# Patient Record
Sex: Female | Born: 1950 | Race: White | Hispanic: No | Marital: Married | Smoking: Former smoker
Health system: Southern US, Community
[De-identification: ages and names within clinical notes are randomized; demographics above are authoritative.]

## PROBLEM LIST (undated history)

## (undated) DIAGNOSIS — E119 Type 2 diabetes mellitus without complications: Secondary | ICD-10-CM

## (undated) DIAGNOSIS — C3490 Malignant neoplasm of unspecified part of unspecified bronchus or lung: Secondary | ICD-10-CM

## (undated) HISTORY — PX: ABDOMINAL HYSTERECTOMY: SHX81

## (undated) HISTORY — DX: Malignant neoplasm of unspecified part of unspecified bronchus or lung: C34.90

---

## 2004-11-17 ENCOUNTER — Emergency Department: Payer: Self-pay | Admitting: Emergency Medicine

## 2005-08-05 ENCOUNTER — Emergency Department: Payer: Self-pay | Admitting: Unknown Physician Specialty

## 2009-01-20 ENCOUNTER — Ambulatory Visit: Payer: Self-pay | Admitting: Internal Medicine

## 2009-08-02 ENCOUNTER — Emergency Department: Payer: Self-pay | Admitting: Emergency Medicine

## 2009-11-02 ENCOUNTER — Ambulatory Visit: Payer: Self-pay | Admitting: Internal Medicine

## 2011-05-02 ENCOUNTER — Other Ambulatory Visit: Payer: Self-pay | Admitting: Physical Medicine and Rehabilitation

## 2011-05-02 DIAGNOSIS — R911 Solitary pulmonary nodule: Secondary | ICD-10-CM

## 2011-05-09 ENCOUNTER — Other Ambulatory Visit: Payer: Self-pay

## 2011-07-31 ENCOUNTER — Other Ambulatory Visit: Payer: Self-pay

## 2011-08-10 ENCOUNTER — Other Ambulatory Visit: Payer: Self-pay

## 2011-12-18 ENCOUNTER — Emergency Department: Payer: Self-pay | Admitting: *Deleted

## 2014-09-13 ENCOUNTER — Ambulatory Visit: Payer: Self-pay | Admitting: Internal Medicine

## 2015-11-23 ENCOUNTER — Emergency Department
Admission: EM | Admit: 2015-11-23 | Discharge: 2015-11-23 | Disposition: A | Discharge status: Other Acute Inpatient Hospital | Payer: BLUE CROSS/BLUE SHIELD | Attending: Emergency Medicine | Admitting: Emergency Medicine

## 2015-11-23 ENCOUNTER — Encounter (HOSPITAL_COMMUNITY): Payer: Self-pay | Admitting: Emergency Medicine

## 2015-11-23 ENCOUNTER — Inpatient Hospital Stay (HOSPITAL_COMMUNITY): Payer: BLUE CROSS/BLUE SHIELD

## 2015-11-23 ENCOUNTER — Emergency Department: Payer: BLUE CROSS/BLUE SHIELD

## 2015-11-23 ENCOUNTER — Inpatient Hospital Stay (HOSPITAL_COMMUNITY)
Admission: EM | Admit: 2015-11-23 | Discharge: 2015-11-26 | DRG: 041 | Disposition: A | Discharge status: Home / Self Care | Payer: BLUE CROSS/BLUE SHIELD | Attending: Internal Medicine | Admitting: Internal Medicine

## 2015-11-23 ENCOUNTER — Encounter: Payer: Self-pay | Admitting: Emergency Medicine

## 2015-11-23 DIAGNOSIS — Z881 Allergy status to other antibiotic agents status: Secondary | ICD-10-CM

## 2015-11-23 DIAGNOSIS — C3490 Malignant neoplasm of unspecified part of unspecified bronchus or lung: Secondary | ICD-10-CM | POA: Diagnosis present

## 2015-11-23 DIAGNOSIS — R471 Dysarthria and anarthria: Secondary | ICD-10-CM | POA: Diagnosis present

## 2015-11-23 DIAGNOSIS — C801 Malignant (primary) neoplasm, unspecified: Secondary | ICD-10-CM | POA: Diagnosis not present

## 2015-11-23 DIAGNOSIS — Z87891 Personal history of nicotine dependence: Secondary | ICD-10-CM

## 2015-11-23 DIAGNOSIS — R4701 Aphasia: Secondary | ICD-10-CM | POA: Diagnosis present

## 2015-11-23 DIAGNOSIS — C649 Malignant neoplasm of unspecified kidney, except renal pelvis: Secondary | ICD-10-CM | POA: Diagnosis present

## 2015-11-23 DIAGNOSIS — C799 Secondary malignant neoplasm of unspecified site: Secondary | ICD-10-CM

## 2015-11-23 DIAGNOSIS — R278 Other lack of coordination: Secondary | ICD-10-CM | POA: Diagnosis present

## 2015-11-23 DIAGNOSIS — R599 Enlarged lymph nodes, unspecified: Secondary | ICD-10-CM | POA: Diagnosis not present

## 2015-11-23 DIAGNOSIS — Z7984 Long term (current) use of oral hypoglycemic drugs: Secondary | ICD-10-CM | POA: Diagnosis not present

## 2015-11-23 DIAGNOSIS — J440 Chronic obstructive pulmonary disease with acute lower respiratory infection: Secondary | ICD-10-CM | POA: Diagnosis present

## 2015-11-23 DIAGNOSIS — E876 Hypokalemia: Secondary | ICD-10-CM | POA: Diagnosis present

## 2015-11-23 DIAGNOSIS — Z90710 Acquired absence of both cervix and uterus: Secondary | ICD-10-CM | POA: Diagnosis not present

## 2015-11-23 DIAGNOSIS — Z880 Allergy status to penicillin: Secondary | ICD-10-CM | POA: Insufficient documentation

## 2015-11-23 DIAGNOSIS — C7931 Secondary malignant neoplasm of brain: Principal | ICD-10-CM | POA: Diagnosis present

## 2015-11-23 DIAGNOSIS — J449 Chronic obstructive pulmonary disease, unspecified: Secondary | ICD-10-CM

## 2015-11-23 DIAGNOSIS — J438 Other emphysema: Secondary | ICD-10-CM

## 2015-11-23 DIAGNOSIS — J189 Pneumonia, unspecified organism: Secondary | ICD-10-CM

## 2015-11-23 DIAGNOSIS — R1310 Dysphagia, unspecified: Secondary | ICD-10-CM | POA: Diagnosis present

## 2015-11-23 DIAGNOSIS — Z79899 Other long term (current) drug therapy: Secondary | ICD-10-CM | POA: Diagnosis not present

## 2015-11-23 DIAGNOSIS — G939 Disorder of brain, unspecified: Secondary | ICD-10-CM

## 2015-11-23 DIAGNOSIS — R4781 Slurred speech: Secondary | ICD-10-CM | POA: Insufficient documentation

## 2015-11-23 DIAGNOSIS — F419 Anxiety disorder, unspecified: Secondary | ICD-10-CM | POA: Diagnosis present

## 2015-11-23 DIAGNOSIS — E119 Type 2 diabetes mellitus without complications: Secondary | ICD-10-CM | POA: Diagnosis present

## 2015-11-23 DIAGNOSIS — J159 Unspecified bacterial pneumonia: Secondary | ICD-10-CM | POA: Insufficient documentation

## 2015-11-23 DIAGNOSIS — R590 Localized enlarged lymph nodes: Secondary | ICD-10-CM | POA: Diagnosis present

## 2015-11-23 DIAGNOSIS — E785 Hyperlipidemia, unspecified: Secondary | ICD-10-CM | POA: Diagnosis present

## 2015-11-23 HISTORY — DX: Type 2 diabetes mellitus without complications: E11.9

## 2015-11-23 LAB — COMPREHENSIVE METABOLIC PANEL
ALK PHOS: 66 U/L (ref 38–126)
ALT: 13 U/L — AB (ref 14–54)
ALT: 14 U/L (ref 14–54)
AST: 13 U/L — AB (ref 15–41)
AST: 15 U/L (ref 15–41)
Albumin: 3.7 g/dL (ref 3.5–5.0)
Albumin: 3 g/dL — ABNORMAL LOW (ref 3.5–5.0)
Alkaline Phosphatase: 69 U/L (ref 38–126)
Anion gap: 9 (ref 5–15)
Anion gap: 11 (ref 5–15)
BILIRUBIN TOTAL: 0.7 mg/dL (ref 0.3–1.2)
BILIRUBIN TOTAL: 0.7 mg/dL (ref 0.3–1.2)
BUN: 10 mg/dL (ref 6–20)
BUN: 6 mg/dL (ref 6–20)
CHLORIDE: 104 mmol/L (ref 101–111)
CHLORIDE: 103 mmol/L (ref 101–111)
CO2: 27 mmol/L (ref 22–32)
CO2: 26 mmol/L (ref 22–32)
CREATININE: 0.74 mg/dL (ref 0.44–1.00)
CREATININE: 0.65 mg/dL (ref 0.44–1.00)
Calcium: 9.7 mg/dL (ref 8.9–10.3)
Calcium: 9.9 mg/dL (ref 8.9–10.3)
GFR calc Af Amer: >60 mL/min (ref >60)
GFR calc Af Amer: >60 mL/min (ref >60)
GLUCOSE: 257 mg/dL — AB (ref 65–99)
Glucose, Bld: 242 mg/dL — ABNORMAL HIGH (ref 65–99)
Potassium: 3.1 mmol/L — ABNORMAL LOW (ref 3.5–5.1)
Potassium: 3.5 mmol/L (ref 3.5–5.1)
Sodium: 140 mmol/L (ref 135–145)
Sodium: 140 mmol/L (ref 135–145)
TOTAL PROTEIN: 7.7 g/dL (ref 6.5–8.1)
Total Protein: 7.8 g/dL (ref 6.5–8.1)

## 2015-11-23 LAB — CBC
HEMATOCRIT: 36.5 % (ref 35.0–47.0)
HEMOGLOBIN: 12.2 g/dL (ref 12.0–16.0)
MCH: 29.9 pg (ref 26.0–34.0)
MCHC: 33.5 g/dL (ref 32.0–36.0)
MCV: 89.3 fL (ref 80.0–100.0)
Platelets: 360 10*3/uL (ref 150–440)
RBC: 4.09 MIL/uL (ref 3.80–5.20)
RDW: 13.5 % (ref 11.5–14.5)
WBC: 8 10*3/uL (ref 3.6–11.0)

## 2015-11-23 LAB — CBC WITH DIFFERENTIAL/PLATELET
BASOS ABS: 0 10*3/uL (ref 0.0–0.1)
Basophils Relative: 0 %
Eosinophils Absolute: 0.1 10*3/uL (ref 0.0–0.7)
Eosinophils Relative: 1 %
HEMATOCRIT: 36.1 % (ref 36.0–46.0)
Hemoglobin: 12 g/dL (ref 12.0–15.0)
LYMPHS PCT: 25 %
Lymphs Abs: 2.1 10*3/uL (ref 0.7–4.0)
MCH: 29.9 pg (ref 26.0–34.0)
MCHC: 33.2 g/dL (ref 30.0–36.0)
MCV: 89.8 fL (ref 78.0–100.0)
Monocytes Absolute: 0.6 10*3/uL (ref 0.1–1.0)
Monocytes Relative: 8 %
Neutro Abs: 5.4 10*3/uL (ref 1.7–7.7)
Neutrophils Relative %: 66 %
Platelets: 369 10*3/uL (ref 150–400)
RBC: 4.02 MIL/uL (ref 3.87–5.11)
RDW: 13.6 % (ref 11.5–15.5)
WBC: 8.2 10*3/uL (ref 4.0–10.5)

## 2015-11-23 LAB — APTT
APTT: 35 s (ref 24–36)
aPTT: 35 seconds (ref 24–37)

## 2015-11-23 LAB — DIFFERENTIAL
BASOS ABS: 0.1 10*3/uL (ref 0–0.1)
Basophils Relative: 1 %
Eosinophils Absolute: 0.1 10*3/uL (ref 0–0.7)
Eosinophils Relative: 2 %
LYMPHS ABS: 1.7 10*3/uL (ref 1.0–3.6)
Lymphocytes Relative: 22 %
MONOS PCT: 8 %
Monocytes Absolute: 0.6 10*3/uL (ref 0.2–0.9)
NEUTROS ABS: 5.4 10*3/uL (ref 1.4–6.5)
Neutrophils Relative %: 67 %

## 2015-11-23 LAB — GLUCOSE, CAPILLARY: Glucose-Capillary: 347 mg/dL — ABNORMAL HIGH (ref 65–99)

## 2015-11-23 LAB — MAGNESIUM: MAGNESIUM: 1.7 mg/dL (ref 1.7–2.4)

## 2015-11-23 LAB — PROTIME-INR
INR: 1.08
INR: 1.14 (ref 0.00–1.49)
Prothrombin Time: 14.2 seconds (ref 11.4–15.0)
Prothrombin Time: 14.8 seconds (ref 11.6–15.2)

## 2015-11-23 MED ORDER — OMEGA-3-ACID ETHYL ESTERS 1 G PO CAPS
2.0000 g | ORAL_CAPSULE | Freq: Two times a day (BID) | ORAL | Status: DC
  Administered 2015-11-23 – 2015-11-26 (×6): 2 g via ORAL
  Filled 2015-11-23 (×7): qty 2

## 2015-11-23 MED ORDER — HYDROCODONE-ACETAMINOPHEN 5-325 MG PO TABS: 1.0000 | ORAL_TABLET | ORAL | Status: DC | PRN

## 2015-11-23 MED ORDER — PANTOPRAZOLE SODIUM 40 MG PO TBEC
40.0000 mg | DELAYED_RELEASE_TABLET | Freq: Every day | ORAL | Status: DC
  Administered 2015-11-24 – 2015-11-26 (×3): 40 mg via ORAL
  Filled 2015-11-23 (×3): qty 1

## 2015-11-23 MED ORDER — AZITHROMYCIN 250 MG PO TABS
500.0000 mg | ORAL_TABLET | Freq: Once | ORAL | Status: AC
  Administered 2015-11-23: 500 mg via ORAL
  Filled 2015-11-23: qty 2

## 2015-11-23 MED ORDER — GADOBENATE DIMEGLUMINE 529 MG/ML IV SOLN
15.0000 mL | Freq: Once | INTRAVENOUS | Status: AC | PRN
  Administered 2015-11-23: 15 mL via INTRAVENOUS

## 2015-11-23 MED ORDER — SODIUM CHLORIDE 0.9 % IV SOLN
INTRAVENOUS | Status: DC
  Administered 2015-11-23: 23:00:00 via INTRAVENOUS
  Filled 2015-11-23: qty 1000

## 2015-11-23 MED ORDER — DEXTROSE 5 % IV SOLN
1.0000 g | Freq: Once | INTRAVENOUS | Status: AC
  Administered 2015-11-23: 1 g via INTRAVENOUS
  Filled 2015-11-23: qty 10

## 2015-11-23 MED ORDER — ALPRAZOLAM 0.5 MG PO TABS
0.5000 mg | ORAL_TABLET | Freq: Three times a day (TID) | ORAL | Status: DC | PRN
  Administered 2015-11-23 – 2015-11-26 (×5): 0.5 mg via ORAL
  Filled 2015-11-23 (×6): qty 1

## 2015-11-23 MED ORDER — DEXAMETHASONE 4 MG PO TABS
4.0000 mg | ORAL_TABLET | Freq: Four times a day (QID) | ORAL | Status: DC
  Administered 2015-11-23 – 2015-11-26 (×11): 4 mg via ORAL
  Filled 2015-11-23 (×14): qty 1

## 2015-11-23 MED ORDER — ACETAMINOPHEN 325 MG PO TABS
650.0000 mg | ORAL_TABLET | Freq: Four times a day (QID) | ORAL | Status: DC | PRN
  Administered 2015-11-25: 650 mg via ORAL
  Filled 2015-11-23: qty 2

## 2015-11-23 MED ORDER — IOHEXOL 300 MG/ML  SOLN
25.0000 mL | INTRAMUSCULAR | Status: AC
  Administered 2015-11-23 – 2015-11-24 (×2): 25 mL via ORAL

## 2015-11-23 MED ORDER — ONDANSETRON HCL 4 MG PO TABS: 4.0000 mg | ORAL_TABLET | Freq: Four times a day (QID) | ORAL | Status: DC | PRN

## 2015-11-23 MED ORDER — ONDANSETRON HCL 4 MG/2ML IJ SOLN: 4.0000 mg | Freq: Four times a day (QID) | INTRAMUSCULAR | Status: DC | PRN

## 2015-11-23 MED ORDER — LEVETIRACETAM 500 MG PO TABS
500.0000 mg | ORAL_TABLET | Freq: Two times a day (BID) | ORAL | Status: DC
  Administered 2015-11-23 – 2015-11-26 (×6): 500 mg via ORAL
  Filled 2015-11-23 (×6): qty 1

## 2015-11-23 MED ORDER — POTASSIUM CHLORIDE CRYS ER 20 MEQ PO TBCR
40.0000 meq | EXTENDED_RELEASE_TABLET | Freq: Once | ORAL | Status: AC
  Administered 2015-11-23: 40 meq via ORAL
  Filled 2015-11-23: qty 2

## 2015-11-23 MED ORDER — INSULIN ASPART 100 UNIT/ML ~~LOC~~ SOLN
0.0000 [IU] | Freq: Three times a day (TID) | SUBCUTANEOUS | Status: DC
  Administered 2015-11-24: 7 [IU] via SUBCUTANEOUS
  Administered 2015-11-24 (×2): 5 [IU] via SUBCUTANEOUS
  Administered 2015-11-25: 2 [IU] via SUBCUTANEOUS
  Administered 2015-11-25 – 2015-11-26 (×4): 5 [IU] via SUBCUTANEOUS

## 2015-11-23 MED ORDER — INSULIN ASPART 100 UNIT/ML ~~LOC~~ SOLN
6.0000 [IU] | Freq: Once | SUBCUTANEOUS | Status: AC
  Administered 2015-11-23: 6 [IU] via SUBCUTANEOUS

## 2015-11-23 MED ORDER — LATANOPROST 0.005 % OP SOLN
1.0000 [drp] | Freq: Every day | OPHTHALMIC | Status: DC
  Administered 2015-11-23 – 2015-11-25 (×3): 1 [drp] via OPHTHALMIC
  Filled 2015-11-23: qty 2.5

## 2015-11-23 NOTE — ED Notes (Signed)
Pt has been being tx for pneumonia the past several days with Amoxicillin. Pt sts she noticed slurred speech that started Friday or Saturday that she remembers- but sts she slept a lot the couple days before that d./t illness. Pt denies headaches, weakness/numbness. Only complaint is slurred speech.

## 2015-11-23 NOTE — ED Notes (Signed)
MD at bedside to discuss treatment with pt and care in the future. Pt and family verbalized understanding of care from this point forward.

## 2015-11-23 NOTE — ED Notes (Signed)
Patient states she has had slurred speech since the 27th of December.  Patient has obvious difficulty forming some words.  Has no other neurological deficits noted during triage, equal grip strengths, no droop, and no drift.  Patient states that others have noticed it since that time, and that it did not start today.  She is alert and oriented, and anxious.  She attributes this to starting amoxicillin on the 27th for an URI because she read that it was a possible side effect.  She is hypertensive in triage at 182/102.

## 2015-11-23 NOTE — Consult Note (Signed)
Reason for Consult:multiple hemorrhagic cranial lesions Referring Physician: Tat  Cynthia Carson is an 65 y.o. female.  HPI: whom presented to her family physician approximately a week ago with a cough. She was given a a course of antibiotics without improvement. Her husband stated he thought her speech has been off for the same week. Family physician noted the slurred speech and stopped the amoxicillin for fear it was causing the dysarthria and arranged for a head CT scan. That scan revealed multiple abnormalities in the cranium and she was transferred to [facility] hospital for further evaluation. MRI scan revealed multiple hemorrhagic lesions located in the right cerebellar hemisphere with mild mass effect, and an occipital lesion and a parietal lesion also on the right side. She was admitted by the medical service for investigation of a primary lesion as the cranial imaging strongly suggests multiple metastatic lesions.  Past Medical History  Diagnosis Date  . Diabetes mellitus without complication [facility])     Past Surgical History  Procedure Laterality Date  . Abdominal hysterectomy      No family history on file.  Social History:  reports that she quit smoking 4 days ago. She does not have any smokeless tobacco history on file. She reports that she does not drink alcohol or use illicit drugs.  Allergies:  Allergies  Allergen Reactions  . Amoxicillin Other (See Comments)    Reaction:  Unknown   . Levofloxacin Other (See Comments)    Reaction:  Joint and muscle pain  Pt states that this medication made her unable to walk.     Medications: I have reviewed the patient's current medications.  Results for orders placed or performed during the hospital encounter of 11/23/15 (from the past 48 hour(s))  CBC with Differential/Platelet     Status: None   Collection Time: 11/23/15  4:45 PM  Result Value Ref Range   WBC 8.2 4.0 - 10.5 K/uL   RBC 4.02 3.87 - 5.11 MIL/uL   Hemoglobin 12.0 12.0 -  15.0 g/dL   HCT 36.1 36.0 - 46.0 %   MCV 89.8 78.0 - 100.0 fL   MCH 29.9 26.0 - 34.0 pg   MCHC 33.2 30.0 - 36.0 g/dL   RDW 13.6 11.5 - 15.5 %   Platelets 369 150 - 400 K/uL   Neutrophils Relative % 66 %   Neutro Abs 5.4 1.7 - 7.7 K/uL   Lymphocytes Relative 25 %   Lymphs Abs 2.1 0.7 - 4.0 K/uL   Monocytes Relative 8 %   Monocytes Absolute 0.6 0.1 - 1.0 K/uL   Eosinophils Relative 1 %   Eosinophils Absolute 0.1 0.0 - 0.7 K/uL   Basophils Relative 0 %   Basophils Absolute 0.0 0.0 - 0.1 K/uL  Comprehensive metabolic panel     Status: Abnormal   Collection Time: 11/23/15  4:45 PM  Result Value Ref Range   Sodium 140 135 - 145 mmol/L   Potassium 3.5 3.5 - 5.1 mmol/L   Chloride 103 101 - 111 mmol/L   CO2 26 22 - 32 mmol/L   Glucose, Bld 242 (H) 65 - 99 mg/dL   BUN 6 6 - 20 mg/dL   Creatinine, Ser 0.65 0.44 - 1.00 mg/dL   Calcium 9.9 8.9 - 10.3 mg/dL   Total Protein 7.7 6.5 - 8.1 g/dL   Albumin 3.0 (L) 3.5 - 5.0 g/dL   AST 15 15 - 41 U/L   ALT 14 14 - 54 U/L   Alkaline Phosphatase 66 38 -  126 U/L   Total Bilirubin 0.7 0.3 - 1.2 mg/dL   GFR calc non Af Amer >60 >60 mL/min   GFR calc Af Amer >60 >60 mL/min    Comment: (NOTE) The eGFR has been calculated using the CKD EPI equation. This calculation has not been validated in all clinical situations. eGFR's persistently <60 mL/min signify possible Chronic Kidney Disease.    Anion gap 11 5 - 15    Dg Chest 2 View  11/23/2015  CLINICAL DATA:  Shortness of Breath EXAM: CHEST  2 VIEW COMPARISON:  August 02, 2009 FINDINGS: There is airspace consolidation in the medial segment of the left lower lobe with volume loss. The lungs elsewhere clear. Heart size and pulmonary vascularity are normal. There is slight prominence in the azygos region compared to prior study. No other evidence suggesting potential adenopathy. No bone lesions. IMPRESSION: Airspace consolidation with volume loss medial segment left lower lobe. Suspect pneumonia,  although an endobronchial lesion causing obstruction of the medial segment left lower lobe bronchus is a possibility. Lungs elsewhere clear. There is prominence in the azygos region compared to the prior study. The possibility of localized lymph node enlargement must be of concern given this appearance unchanged from prior study. Given the findings above, contrast enhanced chest CT would be advisable to further assess. Electronically Signed   By: Lowella Grip III M.D.   On: 11/23/2015 13:51   Ct Head Wo Contrast  11/23/2015  CLINICAL DATA:  Slurred speech since 11/15/2015. EXAM: CT HEAD WITHOUT CONTRAST TECHNIQUE: Contiguous axial images were obtained from the base of the skull through the vertex without intravenous contrast. COMPARISON:  None. FINDINGS: The patient has multiple hemorrhagic mass lesions in the brain including a 3.5 cm hemorrhagic mass in the right cerebellar hemisphere which has a slight mass effect upon the fourth ventricle. There is a 16 mm hemorrhagic metastasis in the posterior aspect of the right occipital lobe and there is a 15 mm hemorrhagic metastasis high in the right parietal lobe. There are several small focal areas of lucency in the periventricular white matter of frontal lobes. There is no midline shift. No acute osseous abnormality. IMPRESSION: Multiple hemorrhagic brain lesions. This most likely represents metastatic disease. Melanoma, renal cell carcinoma, choriocarcinoma, thyroid carcinoma and lung cancer, breast cancer, and hepatocellular carcinoma are the most common hemorrhagic metastases to the brain. Electronically Signed   By: Lorriane Shire M.D.   On: 11/23/2015 12:53    Review of Systems  Constitutional: Positive for malaise/fatigue.  Eyes: Negative.   Respiratory: Positive for cough.   Cardiovascular: Negative.   Gastrointestinal: Negative.   Genitourinary: Negative.   Musculoskeletal: Negative.   Skin: Negative.   Neurological: Positive for headaches.  Negative for seizures and loss of consciousness.       Dysarthria  Endo/Heme/Allergies: Negative.   Psychiatric/Behavioral: Negative.    Blood pressure 156/88, pulse 91, temperature 98.1 F (36.7 C), temperature source Oral, resp. rate 20, height 5' 5"  (1.651 m), weight 74.8 kg (164 lb 14.5 oz), SpO2 99 %. Physical Exam  Constitutional: She is oriented to person, place, and time. She appears well-developed and well-nourished.  HENT:  Head: Normocephalic and atraumatic.  Eyes: Conjunctivae and EOM are normal. Pupils are equal, round, and reactive to light.  Neck: Normal range of motion. Neck supple.  Cardiovascular: Normal rate, regular rhythm and normal heart sounds.   Respiratory: Effort normal and breath sounds normal.  GI: Soft. Bowel sounds are normal.  Neurological: She is alert and  oriented to person, place, and time. She has normal strength and normal reflexes. She displays normal reflexes. No cranial nerve deficit. She exhibits normal muscle tone. Coordination abnormal. GCS eye subscore is 4. GCS verbal subscore is 5. GCS motor subscore is 6.  Dysdiadochokinesia left hand, past pointing left hand with finger nose finger testing.  No nystagmus Perrl, full eom No drift on exam Normal muscle tone and bulk Normal heel shin testing bilaterally Gait not assesed  Skin: Skin is warm, dry and intact.  Psychiatric: She has a normal mood and affect. Her behavior is normal. Judgment and thought content normal. Her speech is slurred. Cognition and memory are normal.    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  Will need decadron, and an anticonvulsant. Will follow.  Tonique Mendonca L 11/23/2015, 7:36 PM

## 2015-11-23 NOTE — ED Provider Notes (Signed)
CSN: [id number]     Arrival date & time 11/23/15  1526 History   First MD Initiated Contact with Patient 11/23/15 1531     Chief Complaint  Patient presents with  . Aphasia     (Consider location/radiation/quality/duration/timing/severity/associated sxs/prior Treatment) HPI   Patient is a 65 year old female presenting as a transfer today. Patient's been having slurred speech for the last 3 days. She reported to the emergency department today at [facility]. She was found to have multiple hemorrhagic brain  tumors. She's also been struggling with a pneumonia which has been treated twice as an outpatient. X-ray from [place] that shows continued left lower lobe consolidation.   Patient smoked for 40 years, quit 4 years ago.  Past Medical History  Diagnosis Date  . Diabetes mellitus without complication [facility])    Past Surgical History  Procedure Laterality Date  . Abdominal hysterectomy     No family history on file. Social History  Substance Use Topics  . Smoking status: Former Smoker    Quit date: 11/19/2015  . Smokeless tobacco: None  . Alcohol Use: No   OB History    No data available     Review of Systems  Constitutional: Negative for fever, activity change and fatigue.  Respiratory: Positive for cough and shortness of breath.   Cardiovascular: Negative for chest pain.  Gastrointestinal: Negative for abdominal pain.  Genitourinary: Negative for difficulty urinating.  Musculoskeletal: Negative for arthralgias.  Neurological: Positive for speech difficulty. Negative for dizziness, seizures, weakness, numbness and headaches.  Psychiatric/Behavioral: Positive for confusion. Negative for agitation.      Allergies  Amoxicillin and Levofloxacin  Home Medications   Prior to Admission medications   Medication Sig Start Date End Date Taking? Authorizing Provider  acetaminophen (TYLENOL) 325 MG tablet Take 650 mg by mouth every 6 (six) hours as needed for mild pain.    Yes Historical Provider, MD  ALPRAZolam Duanne Moron) 0.5 MG tablet Take 0.5 mg by mouth 3 (three) times daily as needed for anxiety.    Yes Historical Provider, MD  latanoprost (XALATAN) 0.005 % ophthalmic solution Place 1 drop into both eyes at bedtime.   Yes Historical Provider, MD  Omega-3 Fatty Acids (FISH OIL) 1000 MG CAPS Take 2,000 mg by mouth 2 (two) times daily.    Yes Historical Provider, MD  omeprazole (PRILOSEC) 20 MG capsule Take 20 mg by mouth 2 (two) times daily.    Yes Historical Provider, MD  sitaGLIPtin (JANUVIA) 100 MG tablet Take 100 mg by mouth daily.   Yes Historical Provider, MD   BP 130/60 mmHg  Pulse 87  Temp(Src) 98.2 F (36.8 C) (Oral)  Resp 22  SpO2 95% Physical Exam  Constitutional: She is oriented to person, place, and time. She appears well-developed and well-nourished.  HENT:  Head: Normocephalic and atraumatic.  Eyes: Conjunctivae are normal. Right eye exhibits no discharge.  Neck: Neck supple.  Cardiovascular: Normal rate, regular rhythm and normal heart sounds.   No murmur heard. Pulmonary/Chest: Effort normal. She has wheezes.  Abdominal: Soft. She exhibits no distension. There is no tenderness.  Musculoskeletal: Normal range of motion. She exhibits no edema.  Neurological: She is oriented to person, place, and time. No cranial nerve deficit.  Slurred speech. Mild dysmetria on the left. Difficulty in repeating sentences.  Skin: Skin is warm and dry. No rash noted. She is not diaphoretic.  Psychiatric: She has a normal mood and affect. Her behavior is normal.  Nursing note and vitals reviewed.  ED Course  Procedures (including critical care time) Labs Review Labs Reviewed  CBC WITH DIFFERENTIAL/PLATELET  COMPREHENSIVE METABOLIC PANEL    Imaging Review Dg Chest 2 View  11/23/2015  CLINICAL DATA:  Shortness of Breath EXAM: CHEST  2 VIEW COMPARISON:  August 02, 2009 FINDINGS: There is airspace consolidation in the medial segment of the left  lower lobe with volume loss. The lungs elsewhere clear. Heart size and pulmonary vascularity are normal. There is slight prominence in the azygos region compared to prior study. No other evidence suggesting potential adenopathy. No bone lesions. IMPRESSION: Airspace consolidation with volume loss medial segment left lower lobe. Suspect pneumonia, although an endobronchial lesion causing obstruction of the medial segment left lower lobe bronchus is a possibility. Lungs elsewhere clear. There is prominence in the azygos region compared to the prior study. The possibility of localized lymph node enlargement must be of concern given this appearance unchanged from prior study. Given the findings above, contrast enhanced chest CT would be advisable to further assess. Electronically Signed   By: Lowella Grip III M.D.   On: 11/23/2015 13:51   Ct Head Wo Contrast  11/23/2015  CLINICAL DATA:  Slurred speech since 11/15/2015. EXAM: CT HEAD WITHOUT CONTRAST TECHNIQUE: Contiguous axial images were obtained from the base of the skull through the vertex without intravenous contrast. COMPARISON:  None. FINDINGS: The patient has multiple hemorrhagic mass lesions in the brain including a 3.5 cm hemorrhagic mass in the right cerebellar hemisphere which has a slight mass effect upon the fourth ventricle. There is a 16 mm hemorrhagic metastasis in the posterior aspect of the right occipital lobe and there is a 15 mm hemorrhagic metastasis high in the right parietal lobe. There are several small focal areas of lucency in the periventricular white matter of frontal lobes. There is no midline shift. No acute osseous abnormality. IMPRESSION: Multiple hemorrhagic brain lesions. This most likely represents metastatic disease. Melanoma, renal cell carcinoma, choriocarcinoma, thyroid carcinoma and lung cancer, breast cancer, and hepatocellular carcinoma are the most common hemorrhagic metastases to the brain. Electronically Signed   By:  Lorriane Shire M.D.   On: 11/23/2015 12:53   I have personally reviewed and evaluated these images and lab results as part of my medical decision-making.   EKG Interpretation None      MDM   Final diagnoses:  None    Patient is a 65 year old female transferred here from [place] with new hemorrhagic  metastatic tumors to the brain. Patient also has left lower lung consolidation on x-ray. I'm concerned that the metastatic tumors could be from her lungs especially given history of 40 years pack history. We will touch base with neurosurgery.  Xray and CBC and Chem 7 already in computer.  4:48 PM Discussed with neurosurgery, they may start dex, they will consult, but recommend admission to find origin of metastasis.     Devron Cohick Julio Alm, MD 11/23/15 1649

## 2015-11-23 NOTE — ED Notes (Addendum)
Per ems-- pt from [facility] with c.o slurred speech. Neurologist is supposed to be meeting pt here. bp 160/80, hr- 90, 95 % RA.

## 2015-11-23 NOTE — ED Notes (Signed)
Patient transported to CT 

## 2015-11-23 NOTE — ED Notes (Signed)
Admitting MD at bedside.

## 2015-11-23 NOTE — ED Provider Notes (Signed)
[facility] Emergency Department Provider Note  Time seen: 12:42 PM  I have reviewed the triage vital signs and the nursing notes.   HISTORY  Chief Complaint Dysphagia    HPI Cynthia Carson is a 65 y.o. female with a past medical history of hyperlipidemia, diabetes who presents the emergency department trouble speaking. According to the patient she's had slurred speech since 11/15/15. She states around that time she saw her primary care physician for what she thought was a sinus infection. She was placed on amoxicillin and developed a slurred speech that same day. Patient states she believed the amoxicillin was causing the slurred speech, she called her primary care doctor who told her to discontinue use of the medication. Patient continued to have slurred speech so she called him back and he stated for her to go to the emergency department. Patient denies any weakness or numbness of any arm or leg, confusion, headache. Denies ever having a stroke or mini stroke in the past. Is not on any blood thinners. Describes her symptoms as moderate.     Past Medical History  Diagnosis Date  . Diabetes mellitus without complication ([place])     There are no active problems to display for this patient.   Past Surgical History  Procedure Laterality Date  . Abdominal hysterectomy      No current outpatient prescriptions on file.  Allergies Amoxicillin and Levofloxacin  No family history on file.  Social History Social History  Substance Use Topics  . Smoking status: Former Smoker    Quit date: 11/19/2015  . Smokeless tobacco: None  . Alcohol Use: No    Review of Systems Constitutional: Negative for fever. Cardiovascular: Negative for chest pain. Respiratory: Negative for shortness of breath. Gastrointestinal: Negative for abdominal pain Genitourinary: Negative for dysuria. Neurological: Negative for headache. Denies focal weakness or numbness. Positive for  slurred speech. 10-point ROS otherwise negative.  ____________________________________________   PHYSICAL EXAM:  VITAL SIGNS: ED Triage Vitals  Enc Vitals Group     BP 11/23/15 1159 182/102 mmHg     Pulse Rate 11/23/15 1159 89     Resp 11/23/15 1159 18     Temp 11/23/15 1159 97.5 F (36.4 C)     Temp Source 11/23/15 1159 Oral     SpO2 11/23/15 1159 96 %     Weight 11/23/15 1159 168 lb (76.204 kg)     Height 11/23/15 1159 '5\' 5"'$  (1.651 m)     Head Cir --      Peak Flow --      Pain Score --      Pain Loc --      Pain Edu? --      Excl. in [place]? --     Constitutional: Alert and oriented. Well appearing and in no distress. Eyes: Normal exam ENT   Head: Normocephalic and atraumatic.   Mouth/Throat: Mucous membranes are moist. Cardiovascular: Normal rate, regular rhythm. No murmur Respiratory: Normal respiratory effort without tachypnea nor retractions. Breath sounds are clear and equal bilaterally. No wheezes/rales/rhonchi. Gastrointestinal: Soft and nontender. No distention.   Musculoskeletal: Nontender with normal range of motion in all extremities Neurologic: No gross focal neurologic deficits. Equal grip strength bilaterally. 5/5 motor in all extremities. Sensation intact and equal in all extremities. No pronator drift. No cranial nerve deficit. 2-3 mm pupils equal round and reactive. Patient does have slurred speech.  Skin:  Skin is warm, dry and intact.  Psychiatric: Mood and affect are normal. Speech  and behavior are normal.  ____________________________________________      RADIOLOGY  CT head shows multiple hemorrhagic lesions consistent with likely metastatic disease.    INITIAL IMPRESSION / ASSESSMENT AND PLAN / ED COURSE  Pertinent labs & imaging results that were available during my care of the patient were reviewed by me and considered in my medical decision making (see chart for details).  Patient presents the emergency department with slurred  speech for the past 7 days. I have reviewed the CT scan myself and it appears to be consistent with either a bleed, old stroke, or mass in the right occipital area. We are currently awaiting radiology read. Awaiting lab results. Patient appears very well on the emergency department besides moderate dysphasia.  CT head shows multiple hemorrhagic lesions possible metastatic spread. We'll discuss the patient with [facility]. I'll also order a chest x-ray as the patient has a long history of smoking however she recently stopped several years ago.  Chest x-ray shows left lower lobe consolidation, patient states she has been coughing for the past 1.5 weeks which is what initially brought her to her primary care physician on 12/27. We will check blood cultures, begin treatment with IV Levaquin for community-acquired pneumonia. Patient will require further imaging to rule out mass or postobstructive pneumonia. I discussed with [facility], they recommend ER to ER transfer for and they will evaluate then. Patient will likely need a metastatic cancer workup. Patient has been accepted by the ER physician [place].       CRITICAL CARE Performed by: Harvest Dark   Total critical care time: 60 minutes  Critical care time was exclusive of separately billable procedures and treating other patients.  Critical care was necessary to treat or prevent imminent or life-threatening deterioration.  Critical care was time spent personally by me on the following activities: development of treatment plan with patient and/or surrogate as well as nursing, discussions with consultants, evaluation of patient's response to treatment, examination of patient, obtaining history from patient or surrogate, ordering and performing treatments and interventions, ordering and review of laboratory studies, ordering and review of radiographic studies, pulse oximetry and re-evaluation of patient's  condition.    ____________________________________________   FINAL CLINICAL IMPRESSION(S) / ED DIAGNOSES  Metastatic brain lesions Pneumonia  Harvest Dark, MD 11/23/15 1406

## 2015-11-23 NOTE — H&P (Signed)
History and Physical  Cynthia Carson CZY:[id number] DOB: 10-27-1951 DOA: 11/23/2015   PCP: Marden Noble, MD  Referring Physician: ED/ Dr. Ellie Lunch  Chief Complaint: slurred speech  HPI:  65 year old female with a history of diabetes mellitus, hyperlipidemia, and anxiety presented with 3-4 day history of slurred speech. Approximately one month ago, the patient was at [facility] where she was treated for pneumonia with azithromycin. She improved clinically. However one week prior to his admission, her symptoms of chest congestion and cough worsened. She went to see her primary care doctor who placed the patient on amoxicillin clavulanate. The patient went to see her primary care doctor on 11/22/2015.  At that time, the patient was told to discontinue her amoxicillin clavulanate because of concern that he may been causing her slurred speech. Because her slow speech did not improve, the patient presented to the emergency department at [facility]. CT of the brain revealed multiple hemorrhagic mass brain lesions in the right cerebellar hemisphere with some mass effect as well as hemorrhagic mass lesions in right occipital and right parietal lobes. Because of concerns for possible neurosurgical intervention, the patient was transferred to [facility]. Presently, the patient denies any fevers, chills, chest pain, suspect, nausea, vomiting, diarrhea, abdominal pain, hemoptysis, hematemesis, medication, melena, dysuria, hematuria. She denies any focal tremor weakness, diplopia, or syncope. She has intermittent headaches at baseline. This has not been worse than usual. In the emergency department, the patient's vital signs were stable. BMP showed potassium 3.1. Otherwise CBC, hepatic enzymes, and BMP were unremarkable. Chest x-ray showed left lower lobe consolidation although there is suspicion for possible endobronchial lesion. The patient had a mammogram within the last 12 months which was negative. She  has never had a colonoscopy. The patient has had hysterectomy; she denies any vaginal bleeding. Assessment/Plan: Slurred speech/metastatic brain lesions -MRI brain with and without gadolinium -Neurosurgery [facility]) was consulted from the emergency department -Suspect primary source may be long given the patient's extensive smoking history  -Also suspect that the patient's LLL  opacity is not likely pneumonia -CT chest with contrast -CT abdomen and pelvis with contrast -Check coags -may need dexamethasone, but defer to neurosurgery -give 1 L NS as pt will receive contrast load Diabetes mellitus type 2 -Hemoglobin A1c -Hold Januvia for now -NovoLog sliding scale History of tobacco -Quit 4 years ago -Nearly 60-pack-year history Anxiety -Continue Xanax when necessary Hyperlipidemia -Continue fish oil  Hypokalemia -Replete -Check magnesium        Past Medical History  Diagnosis Date  . Diabetes mellitus without complication [facility])    Past Surgical History  Procedure Laterality Date  . Abdominal hysterectomy     Social History:  reports that she quit smoking 4 days ago. She does not have any smokeless tobacco history on file. She reports that she does not drink alcohol or use illicit drugs.    family history --reviewed; no pertinent family history   Allergies  Allergen Reactions  . Amoxicillin Other (See Comments)    Reaction:  Unknown   . Levofloxacin Other (See Comments)    Reaction:  Joint and muscle pain  Pt states that this medication made her unable to walk.       Prior to Admission medications   Medication Sig Start Date End Date Taking? Authorizing Provider  acetaminophen (TYLENOL) 325 MG tablet Take 650 mg by mouth every 6 (six) hours as needed for mild pain.   Yes Historical Provider, MD  ALPRAZolam Duanne Moron)  0.5 MG tablet Take 0.5 mg by mouth 3 (three) times daily as needed for anxiety.    Yes Historical Provider, MD  latanoprost (XALATAN) 0.005 %  ophthalmic solution Place 1 drop into both eyes at bedtime.   Yes Historical Provider, MD  Omega-3 Fatty Acids (FISH OIL) 1000 MG CAPS Take 2,000 mg by mouth 2 (two) times daily.    Yes Historical Provider, MD  omeprazole (PRILOSEC) 20 MG capsule Take 20 mg by mouth 2 (two) times daily.    Yes Historical Provider, MD  sitaGLIPtin (JANUVIA) 100 MG tablet Take 100 mg by mouth daily.   Yes Historical Provider, MD    Review of Systems:  Constitutional:  No weight loss, night sweats, Fevers, chills, fatigue.  Head&Eyes: No headache.  No vision loss.  No eye pain or scotoma ENT:  No Difficulty swallowing,Tooth/dental problems,Sore throat,  No ear ache, post nasal drip,  Cardio-vascular:  No chest pain, Orthopnea, PND, swelling in lower extremities,  dizziness, palpitations  GI:  No  abdominal pain, nausea, vomiting, diarrhea, loss of appetite, hematochezia, melena, heartburn, indigestion, Resp:  No shortness of breath with exertion or at rest.  No coughing up of blood .No wheezing.No chest wall deformity  Skin:  no rash or lesions.  GU:  no dysuria, change in color of urine, no urgency or frequency. No flank pain.  Musculoskeletal:  No joint pain or swelling. No decreased range of motion. No back pain.  Psych:  No change in mood or affect.  Neurologic: N no dysesthesia, no focal weakness, no vision loss. No syncope  Physical Exam: Filed Vitals:   11/23/15 1630 11/23/15 1700 11/23/15 1715 11/23/15 1730  BP: 130/60 148/78 145/82 165/81  Pulse: 87 89 93 102  Temp:      TempSrc:      Resp: '22  16 21  '$ SpO2: 95% 95% 93% 95%   General:  A&O x 3, NAD, nontoxic, pleasant/cooperative Head/Eye: No conjunctival hemorrhage, no icterus, [place]/AT, No nystagmus ENT:  No icterus,  No thrush, good dentition, no pharyngeal exudate Neck:  No masses, no lymphadenpathy, no bruits CV:  RRR, no rub, no gallop, no S3 Lung:  Diminished breath sounds left base. No wheezing. Good air movement.  Abdomen:  soft/NT, +BS, nondistended, no peritoneal signs; no hepatosplenomegaly  Ext: No cyanosis, No rashes, No petechiae, No lymphangitis, No edema Neuro: CNII-XII intact, strength 4/5 in bilateral upper and lower extremities, no dysmetria  Labs on Admission:  Basic Metabolic Panel:  Recent Labs Lab 11/23/15 1235 11/23/15 1645  NA 140 140  K 3.1* 3.5  CL 104 103  CO2 27 26  GLUCOSE 257* 242*  BUN 10 6  CREATININE 0.74 0.65  CALCIUM 9.7 9.9   Liver Function Tests:  Recent Labs Lab 11/23/15 1235 11/23/15 1645  AST 13* 15  ALT 13* 14  ALKPHOS 69 66  BILITOT 0.7 0.7  PROT 7.8 7.7  ALBUMIN 3.7 3.0*   No results for input(s): LIPASE, AMYLASE in the last 168 hours. No results for input(s): AMMONIA in the last 168 hours. CBC:  Recent Labs Lab 11/23/15 1235 11/23/15 1645  WBC 8.0 8.2  NEUTROABS 5.4 5.4  HGB 12.2 12.0  HCT 36.5 36.1  MCV 89.3 89.8  PLT 360 369   Cardiac Enzymes: No results for input(s): CKTOTAL, CKMB, CKMBINDEX, TROPONINI in the last 168 hours. BNP: Invalid input(s): POCBNP CBG: No results for input(s): GLUCAP in the last 168 hours.  Radiological Exams on Admission: Dg Chest 2 View  11/23/2015  CLINICAL DATA:  Shortness of Breath EXAM: CHEST  2 VIEW COMPARISON:  August 02, 2009 FINDINGS: There is airspace consolidation in the medial segment of the left lower lobe with volume loss. The lungs elsewhere clear. Heart size and pulmonary vascularity are normal. There is slight prominence in the azygos region compared to prior study. No other evidence suggesting potential adenopathy. No bone lesions. IMPRESSION: Airspace consolidation with volume loss medial segment left lower lobe. Suspect pneumonia, although an endobronchial lesion causing obstruction of the medial segment left lower lobe bronchus is a possibility. Lungs elsewhere clear. There is prominence in the azygos region compared to the prior study. The possibility of localized lymph node enlargement must  be of concern given this appearance unchanged from prior study. Given the findings above, contrast enhanced chest CT would be advisable to further assess. Electronically Signed   By: Lowella Grip III M.D.   On: 11/23/2015 13:51   Ct Head Wo Contrast  11/23/2015  CLINICAL DATA:  Slurred speech since 11/15/2015. EXAM: CT HEAD WITHOUT CONTRAST TECHNIQUE: Contiguous axial images were obtained from the base of the skull through the vertex without intravenous contrast. COMPARISON:  None. FINDINGS: The patient has multiple hemorrhagic mass lesions in the brain including a 3.5 cm hemorrhagic mass in the right cerebellar hemisphere which has a slight mass effect upon the fourth ventricle. There is a 16 mm hemorrhagic metastasis in the posterior aspect of the right occipital lobe and there is a 15 mm hemorrhagic metastasis high in the right parietal lobe. There are several small focal areas of lucency in the periventricular white matter of frontal lobes. There is no midline shift. No acute osseous abnormality. IMPRESSION: Multiple hemorrhagic brain lesions. This most likely represents metastatic disease. Melanoma, renal cell carcinoma, choriocarcinoma, thyroid carcinoma and lung cancer, breast cancer, and hepatocellular carcinoma are the most common hemorrhagic metastases to the brain. Electronically Signed   By: Lorriane Shire M.D.   On: 11/23/2015 12:53        Time spent: 27mnutes Code Status:   FULL Family Communication:   Husband at bedside   Ajwa Kimberley, DO  [facility] Pager [phone] If 7PM-7AM, please contact night-coverage [email or web address] Password [email or web address] 11/23/2015, 6:06 PM

## 2015-11-23 NOTE — ED Notes (Signed)
Report called to [facility] ED. Charge RN verbalized understanding of pt and treatment plan.

## 2015-11-24 ENCOUNTER — Inpatient Hospital Stay (HOSPITAL_COMMUNITY): Payer: BLUE CROSS/BLUE SHIELD

## 2015-11-24 ENCOUNTER — Ambulatory Visit: Payer: BLUE CROSS/BLUE SHIELD | Attending: Radiation Oncology | Admitting: Radiation Oncology

## 2015-11-24 ENCOUNTER — Ambulatory Visit
Admission: RE | Admit: 2015-11-24 | Discharge: 2015-11-24 | Disposition: A | Discharge status: Home / Self Care | Payer: BLUE CROSS/BLUE SHIELD | Source: Ambulatory Visit | Attending: Radiation Oncology | Admitting: Radiation Oncology

## 2015-11-24 ENCOUNTER — Encounter (HOSPITAL_COMMUNITY): Payer: Self-pay | Admitting: Radiology

## 2015-11-24 DIAGNOSIS — C7931 Secondary malignant neoplasm of brain: Principal | ICD-10-CM

## 2015-11-24 DIAGNOSIS — R590 Localized enlarged lymph nodes: Secondary | ICD-10-CM

## 2015-11-24 DIAGNOSIS — R599 Enlarged lymph nodes, unspecified: Secondary | ICD-10-CM

## 2015-11-24 DIAGNOSIS — C801 Malignant (primary) neoplasm, unspecified: Secondary | ICD-10-CM

## 2015-11-24 DIAGNOSIS — R471 Dysarthria and anarthria: Secondary | ICD-10-CM

## 2015-11-24 LAB — GLUCOSE, CAPILLARY
GLUCOSE-CAPILLARY: 310 mg/dL — AB (ref 65–99)
GLUCOSE-CAPILLARY: 277 mg/dL — AB (ref 65–99)
Glucose-Capillary: 281 mg/dL — ABNORMAL HIGH (ref 65–99)
Glucose-Capillary: 291 mg/dL — ABNORMAL HIGH (ref 65–99)

## 2015-11-24 MED ORDER — IOHEXOL 300 MG/ML  SOLN
100.0000 mL | Freq: Once | INTRAMUSCULAR | Status: AC | PRN
  Administered 2015-11-24: 100 mL via INTRAVENOUS

## 2015-11-24 MED ORDER — FLUCONAZOLE 100 MG PO TABS
100.0000 mg | ORAL_TABLET | Freq: Every day | ORAL | Status: DC
  Administered 2015-11-24 – 2015-11-26 (×3): 100 mg via ORAL
  Filled 2015-11-24 (×4): qty 1

## 2015-11-24 MED ORDER — POTASSIUM CHLORIDE IN NACL 20-0.9 MEQ/L-% IV SOLN
INTRAVENOUS | Status: DC
  Administered 2015-11-24 – 2015-11-26 (×4): via INTRAVENOUS
  Filled 2015-11-24 (×5): qty 1000

## 2015-11-24 MED ORDER — INSULIN GLARGINE 100 UNIT/ML ~~LOC~~ SOLN
10.0000 [IU] | Freq: Every day | SUBCUTANEOUS | Status: DC
  Administered 2015-11-24 – 2015-11-25 (×2): 10 [IU] via SUBCUTANEOUS
  Filled 2015-11-24 (×3): qty 0.1

## 2015-11-24 NOTE — Progress Notes (Signed)
Patient ID: Cynthia Carson, female   DOB: [birth year], 65 y.o.   MRN: [id number] BP 128/76 mmHg  Pulse 99  Temp(Src) 98 F (36.7 C) (Oral)  Resp 20  Ht '5\' 5"'$  (1.651 m)  Wt 74.8 kg (164 lb 14.5 oz)  BMI 27.44 kg/m2  SpO2 97% Alert and oriented x 4 Will defer at this time pending results of lymph node biopsy. It may be that Rad onc would want surgical treatment of cerebellar lesion. I will discuss this with them. kc

## 2015-11-24 NOTE — Consult Note (Signed)
Name: Cynthia Carson MRN: [id number] DOB: 1951/07/19    ADMISSION DATE:  11/23/2015   CONSULTATION DATE: 11/24/15  REFERRING MD : Dr. Coralyn Pear  CHIEF COMPLAINT:  Lung mass    HISTORY OF PRESENT ILLNESS:  This is a pleasant 65 yo white female, former smoker, with h/o Type 2 DM who presented to [facility] ED with speech difficulty. Patient state that about a week ago, she had a cough and chest congestion. She went to see her PCP and she was started on amoxicillin  for URI.  After starting amoxicillin, she developed slurred speech so she called her PCP's office and was asked to stop the amoxicillin. Her symptoms persisted hence she called her PCP who referred her to the ED. At the ED, her CT head showed multiple hemorrhagic brain lesions. She was transferred to [facility] for further evaluation and treatment.   She is still c/o speech difficulty and a mildly productive cough. Otherwise patient offers no other symptoms. She has a 40 pack-years smoking history and quit 4 years ago.   PAST MEDICAL HISTORY :   has a past medical history of Diabetes mellitus without complication ([place]).  has past surgical history that includes Abdominal hysterectomy. Prior to Admission medications   Medication Sig Start Date End Date Taking? Authorizing Provider  acetaminophen (TYLENOL) 325 MG tablet Take 650 mg by mouth every 6 (six) hours as needed for mild pain.   Yes Historical Provider, MD  ALPRAZolam Duanne Moron) 0.5 MG tablet Take 0.5 mg by mouth 3 (three) times daily as needed for anxiety.    Yes Historical Provider, MD  latanoprost (XALATAN) 0.005 % ophthalmic solution Place 1 drop into both eyes at bedtime.   Yes Historical Provider, MD  Omega-3 Fatty Acids (FISH OIL) 1000 MG CAPS Take 2,000 mg by mouth 2 (two) times daily.    Yes Historical Provider, MD  omeprazole (PRILOSEC) 20 MG capsule Take 20 mg by mouth 2 (two) times daily.    Yes Historical Provider, MD  sitaGLIPtin (JANUVIA) 100 MG tablet Take 100 mg by mouth daily.    Yes Historical Provider, MD   Allergies  Allergen Reactions  . Amoxicillin Other (See Comments)    Reaction:  Unknown   . Levofloxacin Other (See Comments)    Reaction:  Joint and muscle pain  Pt states that this medication made her unable to walk.     FAMILY HISTORY:  family history is not on file. SOCIAL HISTORY:  reports that she quit smoking 5 days ago. She does not have any smokeless tobacco history on file. She reports that she does not drink alcohol or use illicit drugs.  REVIEW OF SYSTEMS:   Constitutional: Negative for fever and chills.  HENT: Negative for congestion and rhinorrhea.  Eyes: Negative for redness and visual disturbance.  Respiratory: Negative for shortness of breath and wheezing but positive for cough  Cardiovascular: Negative for chest pain and palpitations.  Gastrointestinal: Negative bloody stools, abdominal pain, nausea and vomiting Genitourinary: Negative for dysuria and urgency.  Musculoskeletal: Negative for myalgias and arthralgias.  Skin: Negative for pallor and wound.  Neurological: Negative for dizziness and headaches but positive for speech difficulties    VITAL SIGNS: Temp:  [97.5 F (36.4 C)-98.6 F (37 C)] 98 F (36.7 C) (01/05 1327) Pulse Rate:  [87-102] 99 (01/05 1327) Resp:  [16-27] 20 (01/05 1327) BP: (123-165)/(60-104) 128/76 mmHg (01/05 1327) SpO2:  [93 %-99 %] 97 % (01/05 1327) Weight:  [74.8 kg (164 lb 14.5 oz)] 74.8 kg (  164 lb 14.5 oz) (01/04 1920)  PHYSICAL EXAMINATION: General: Well nourished, NAD Neuro: Slurred speech with mild expressive aphasia,  tongue protrudes in midline, moves all extremities HEENT: Oral mucos moist, trachea midline Cardiovascular: RRR, S1/S2 Lungs: CTAB, diminished in the bases ; left>>right Abdomen: Soft, NT/ND, normal bowel sounds Musculoskeletal:  No joint deformities; gait is normal Skin: Warm, dry, intact   Recent Labs Lab 11/23/15 1235 11/23/15 1645  NA 140 140  K 3.1* 3.5  CL  104 103  CO2 27 26  BUN 10 6  CREATININE 0.74 0.65  GLUCOSE 257* 242*    Recent Labs Lab 11/23/15 1235 11/23/15 1645  HGB 12.2 12.0  HCT 36.5 36.1  WBC 8.0 8.2  PLT 360 369   Dg Chest 2 View  11/23/2015  CLINICAL DATA:  Shortness of Breath EXAM: CHEST  2 VIEW COMPARISON:  August 02, 2009 FINDINGS: There is airspace consolidation in the medial segment of the left lower lobe with volume loss. The lungs elsewhere clear. Heart size and pulmonary vascularity are normal. There is slight prominence in the azygos region compared to prior study. No other evidence suggesting potential adenopathy. No bone lesions. IMPRESSION: Airspace consolidation with volume loss medial segment left lower lobe. Suspect pneumonia, although an endobronchial lesion causing obstruction of the medial segment left lower lobe bronchus is a possibility. Lungs elsewhere clear. There is prominence in the azygos region compared to the prior study. The possibility of localized lymph node enlargement must be of concern given this appearance unchanged from prior study. Given the findings above, contrast enhanced chest CT would be advisable to further assess. Electronically Signed   By: Lowella Grip III M.D.   On: 11/23/2015 13:51   Ct Head Wo Contrast  11/23/2015  CLINICAL DATA:  Slurred speech since 11/15/2015. EXAM: CT HEAD WITHOUT CONTRAST TECHNIQUE: Contiguous axial images were obtained from the base of the skull through the vertex without intravenous contrast. COMPARISON:  None. FINDINGS: The patient has multiple hemorrhagic mass lesions in the brain including a 3.5 cm hemorrhagic mass in the right cerebellar hemisphere which has a slight mass effect upon the fourth ventricle. There is a 16 mm hemorrhagic metastasis in the posterior aspect of the right occipital lobe and there is a 15 mm hemorrhagic metastasis high in the right parietal lobe. There are several small focal areas of lucency in the periventricular white matter  of frontal lobes. There is no midline shift. No acute osseous abnormality. IMPRESSION: Multiple hemorrhagic brain lesions. This most likely represents metastatic disease. Melanoma, renal cell carcinoma, choriocarcinoma, thyroid carcinoma and lung cancer, breast cancer, and hepatocellular carcinoma are the most common hemorrhagic metastases to the brain. Electronically Signed   By: Lorriane Shire M.D.   On: 11/23/2015 12:53   Ct Chest W Contrast  11/24/2015  CLINICAL DATA:  Inpatient with brain metastases of uncertain primary EXAM: CT CHEST, ABDOMEN, AND PELVIS WITH CONTRAST TECHNIQUE: Multidetector CT imaging of the chest, abdomen and pelvis was performed following the standard protocol during bolus administration of intravenous contrast. CONTRAST:  125m OMNIPAQUE IOHEXOL 300 MG/ML  SOLN COMPARISON:  Chest radiograph from one day prior. FINDINGS: CT CHEST Mediastinum/Nodes: Normal heart size. Trace pericardial fluid/thickening. Atherosclerotic nonaneurysmal thoracic aorta. Normal caliber pulmonary arteries. No central pulmonary emboli. Normal visualized thyroid. Normal esophagus. No axillary lymphadenopathy. There is a mildly enlarged 1.0 cm right supraclavicular lymph node (series 201/ image 7). There are bulky confluent enlarged right paratracheal lymph nodes, largest 3.1 cm (series 201/image 18). There is  a bulky 3.5 cm subcarinal node (series 201/image 27). There is an enlarged 1.3 cm prevascular lymph node in the upper left mediastinum (series 201/image 13). There is confluent right hilar lymphadenopathy, with a dominant 3.0 cm right hilar node (series 201/image 25). Lungs/Pleura: No pneumothorax. No pleural effusion. There is an approximately 3.8 x 2.5 cm lung mass in the central left lower lobe (series 205/ image 27), which occludes the left lower lobe bronchus, with associated complete left lower lobe atelectasis. No acute consolidative airspace disease, significant pulmonary nodules or lung masses in the  remaining lung lobes. Musculoskeletal:  No aggressive appearing focal osseous lesions. CT ABDOMEN AND PELVIS Hepatobiliary: Normal liver with no liver mass. Normal gallbladder with no radiopaque cholelithiasis. No biliary ductal dilatation. Pancreas: Normal, with no mass or duct dilation. Spleen: Normal size. No mass. Adrenals/Urinary Tract: Normal right adrenal. A 1.6 cm left adrenal mass demonstrates indeterminate density of 93 HU. There is a 3.4 x 3.0 cm solid mass in the upper left kidney (series 301/image 80), which demonstrates slight hypoenhancement relative to the renal parenchyma. No hydronephrosis. Normal bladder. Stomach/Bowel: Grossly normal stomach. Normal caliber small bowel with no small bowel wall thickening. Normal appendix. Normal large bowel with no diverticulosis, large bowel wall thickening or pericolonic fat stranding. Vascular/Lymphatic: Atherosclerotic nonaneurysmal abdominal aorta. Patent portal, splenic, hepatic and renal veins. There is a 1.2 x 1.1 cm mass in the left lower quadrant mesenteric (series 201/ image 96). Otherwise no lymphadenopathy in the abdomen or pelvis. Reproductive: Status post hysterectomy, with no abnormal findings at the vaginal cuff. No adnexal mass. Other: No pneumoperitoneum, ascites or focal fluid collection. Musculoskeletal: No aggressive appearing focal osseous lesions. IMPRESSION: 1. Central left lower lobe 3.8 cm lung mass, most in keeping with a primary bronchogenic carcinoma, which occludes the left lower lobe bronchus, with associated complete left lower lobe atelectasis. 2. Bulky ipsilateral, subcarinal and contralateral mediastinal lymphadenopathy. Bulky contralateral hilar lymphadenopathy. Contralateral supraclavicular lymphadenopathy. 3. Trace pericardial fluid/thickening.  No pleural effusion. 4. Solid 3.4 cm renal mass in the upper left kidney, favor a renal metastasis. 5. Indeterminate left adrenal 1.6 cm mass, probably an adrenal metastasis. 6. Left  lower quadrant 1.2 cm mesenteric mass, favor a hematogenous metastasis. Electronically Signed   By: Ilona Sorrel M.D.   On: 11/24/2015 08:16   Mr Jeri Cos CV Contrast  11/23/2015  CLINICAL DATA:  Initial evaluation for intracranial metastasis. EXAM: MRI HEAD WITHOUT AND WITH CONTRAST TECHNIQUE: Multiplanar, multiecho pulse sequences of the brain and surrounding structures were obtained without and with intravenous contrast. CONTRAST:  74m MULTIHANCE GADOBENATE DIMEGLUMINE 529 MG/ML IV SOLN COMPARISON:  Prior CT from earlier the same day. FINDINGS: Age appropriate cerebral atrophy present. Patchy T2/FLAIR hyperintensity within the periventricular, deep, and subcortical white matter both cerebral hemispheres present, most like related to mild chronic small vessel ischemic disease. No acute infarct. Major intracranial vascular flow voids maintained. Previously identified hemorrhagic mass centered at the right cerebellar hemisphere again seen, measuring 2.5 x 3.6 x 2.2 cm (AP by transverse by craniocaudad). Internal fluid fluid level present within this lesion. There is mild localized vasogenic edema with partial effacement of the fourth ventricle which remains patent. No hydrocephalus. Cerebellar tonsils are somewhat low lying at the upper limits of normal measuring 5-6 mm below the foramen magnum. Lesion abuts the right tentorium superiorly. There is focal enhancement along the tentorium itself (series 12, image 6). Second cortically based lesion within the right occipital lobe measures 1.4 x 1.5 x 1.3 cm (  AP by transverse by craniocaudad). This lesion is also hemorrhagic in nature with internal fluid fluid level. Mild surrounding vasogenic edema without significant mass effect. Third lesion centered within the parasagittal high right parietal lobe measures 17 x 16 x 18 mm (AP by transverse by craniocaudad). This lesion is somewhat cystic in nature with minimal internal blood products. Mild localized vasogenic edema  without significant mass effect. Possible additional tiny 6 mm lesion within the right occipital lobe without significant mass effect (series 11, image 13). Faint brush like enhancement measuring approximately 6 mm present within the parasagittal right frontal lobe (series 11, image 19). Indeterminate. Pituitary gland normal. No acute abnormality about the orbits. Sequela prior bilateral lens extraction noted. Paranasal sinuses are clear. No mastoid effusion. Inner ear structures normal. Bone marrow signal intensity within normal limits. IMPRESSION: 1. 2.5 x 3.6 x 2.2 cm hemorrhagic metastasis centered at the right cerebellar hemisphere. There is mild localized vasogenic edema with mild mass effect on the adjacent fourth ventricle without hydrocephalus. 2. Additional 2 hemorrhagic metastatic lesions within the right parietal and occipital lobes as above. Mild localized edema without significant mass effect. 3. Additional possible tiny 6 mm metastasis within the right occipital lobe as above. Attention at follow-up. 4. Faint brush like enhancement within the parasagittal right frontal lobe as above, indeterminate. Attention at follow-up. 5. Cerebellar tonsils positioned at the upper limits of normal 5-6 mm below the foramen magnum. This is felt to be likely incidental and not due to edema from the right cerebellar metastasis. Electronically Signed   By: Jeannine Boga M.D.   On: 11/23/2015 22:00   Ct Abdomen Pelvis W Contrast  11/24/2015  CLINICAL DATA:  Inpatient with brain metastases of uncertain primary EXAM: CT CHEST, ABDOMEN, AND PELVIS WITH CONTRAST TECHNIQUE: Multidetector CT imaging of the chest, abdomen and pelvis was performed following the standard protocol during bolus administration of intravenous contrast. CONTRAST:  139m OMNIPAQUE IOHEXOL 300 MG/ML  SOLN COMPARISON:  Chest radiograph from one day prior. FINDINGS: CT CHEST Mediastinum/Nodes: Normal heart size. Trace pericardial  fluid/thickening. Atherosclerotic nonaneurysmal thoracic aorta. Normal caliber pulmonary arteries. No central pulmonary emboli. Normal visualized thyroid. Normal esophagus. No axillary lymphadenopathy. There is a mildly enlarged 1.0 cm right supraclavicular lymph node (series 201/ image 7). There are bulky confluent enlarged right paratracheal lymph nodes, largest 3.1 cm (series 201/image 18). There is a bulky 3.5 cm subcarinal node (series 201/image 27). There is an enlarged 1.3 cm prevascular lymph node in the upper left mediastinum (series 201/image 13). There is confluent right hilar lymphadenopathy, with a dominant 3.0 cm right hilar node (series 201/image 25). Lungs/Pleura: No pneumothorax. No pleural effusion. There is an approximately 3.8 x 2.5 cm lung mass in the central left lower lobe (series 205/ image 27), which occludes the left lower lobe bronchus, with associated complete left lower lobe atelectasis. No acute consolidative airspace disease, significant pulmonary nodules or lung masses in the remaining lung lobes. Musculoskeletal:  No aggressive appearing focal osseous lesions. CT ABDOMEN AND PELVIS Hepatobiliary: Normal liver with no liver mass. Normal gallbladder with no radiopaque cholelithiasis. No biliary ductal dilatation. Pancreas: Normal, with no mass or duct dilation. Spleen: Normal size. No mass. Adrenals/Urinary Tract: Normal right adrenal. A 1.6 cm left adrenal mass demonstrates indeterminate density of 93 HU. There is a 3.4 x 3.0 cm solid mass in the upper left kidney (series 301/image 80), which demonstrates slight hypoenhancement relative to the renal parenchyma. No hydronephrosis. Normal bladder. Stomach/Bowel: Grossly normal stomach. Normal caliber  small bowel with no small bowel wall thickening. Normal appendix. Normal large bowel with no diverticulosis, large bowel wall thickening or pericolonic fat stranding. Vascular/Lymphatic: Atherosclerotic nonaneurysmal abdominal aorta. Patent  portal, splenic, hepatic and renal veins. There is a 1.2 x 1.1 cm mass in the left lower quadrant mesenteric (series 201/ image 96). Otherwise no lymphadenopathy in the abdomen or pelvis. Reproductive: Status post hysterectomy, with no abnormal findings at the vaginal cuff. No adnexal mass. Other: No pneumoperitoneum, ascites or focal fluid collection. Musculoskeletal: No aggressive appearing focal osseous lesions. IMPRESSION: 1. Central left lower lobe 3.8 cm lung mass, most in keeping with a primary bronchogenic carcinoma, which occludes the left lower lobe bronchus, with associated complete left lower lobe atelectasis. 2. Bulky ipsilateral, subcarinal and contralateral mediastinal lymphadenopathy. Bulky contralateral hilar lymphadenopathy. Contralateral supraclavicular lymphadenopathy. 3. Trace pericardial fluid/thickening.  No pleural effusion. 4. Solid 3.4 cm renal mass in the upper left kidney, favor a renal metastasis. 5. Indeterminate left adrenal 1.6 cm mass, probably an adrenal metastasis. 6. Left lower quadrant 1.2 cm mesenteric mass, favor a hematogenous metastasis. Electronically Signed   By: Ilona Sorrel M.D.   On: 11/24/2015 08:16   SIGNIFICANT EVENTS  Week of December 20th-Diagnosed with URI by PCP and started on antibiotics (amoxicillin) 12/27: Onset of slurred speech; called PCP; told to stop amoxi but symptoms persisted 01/04: Went to [facility]  ED and CT head showed multiple hemorrhagic brain lesions suggestive of metastatic disease; patient transferred to [facility]. 01/5: CT chest/abdomen/pelvis shows  A 3.8cm lung mass, diffuse lymphadenopathy, adrenal mass and mesenteric mass  STUDIES:  CT abdomen, pelvis and chest; MRI brain and CT head   ASSESSMENT / PLAN: 65 YO female, former smoker presenting with a 3.8cm left lower lobe lung mass, extensive lymphadenopathy, multiple cerebral lesions, adrenal and mesenteric masses; highly suggestive of advanced metastatic disease. Primary source likely  pulmonary given patient's history of smoking.    Left lower lobe lung mass-r/o lung carcinoma Post-obstructive pneumonia-afebrile without leukocytosis  Plan Consult IR for IR guided supraclavicular lymph node biopsy or left adrenal mass biopsy If unable to obtain biopsy, will plan for bronchoscopy with biopsy on Monday 01/09  Rest of treatment plan per primary team.   Magdalene S. Patria Mane, NP-C Pulmonary and [place] Pager: [phone]  11/24/2015, 1:46 PM

## 2015-11-24 NOTE — Progress Notes (Signed)
[facility] PROGRESS NOTE  Cynthia Carson PIR:[id number] DOB: 11-12-1951 DOA: 11/23/2015 PCP: Marden Noble, MD  Assessment/Plan: 1. Suspected metastatic lung cancer. -Cynthia Carson having a history of tobacco abuse (47 pack years) presenting with slurred speech. Brain imaging that included CT scan and MRI revealed 2.5 x 3.6 x 2.2 cm hemorrhagic metastatic disease at right cerebellar hemisphere with 2 additional hemorrhagic metastatic lesions at parietal and occipital lobes. -CT scan of lungs with IV contrast showed central left lower lobe 3.8 cm lung mass. -Radiology reporting findings having appearance of metastatic primary bronchogenic carcinoma. -Have discussed case with pulmonary critical care medicine for bronchoscopy to obtain tissue diagnosis. -Radiation oncology has also been consulted for metastatic lesions involving brain. -Plan to continue dexamethasone 4 mg IV QID as well as seizure prophylaxis with Keppra 500 mg by mouth twice a day. -Continue supportive care  2.  Type 2 diabetes mellitus -Blood sugars are elevated in the 200-300 range likely precipitated by systemic steroids -Will start Lantus 10 units subcutaneous daily, meanwhile continue sliding scale coverage   Code Status: Full code Family Communication: spoke with her husband at bedside updated him on patient's condition and radiologic studies Disposition Plan: pulmonary critical care medicine consulted for bronchoscopy  Consultants:  Pulmonary critical care medicine  Radiation oncology  Procedures:  Plan for bronchoscopy  HPI/Subjective: Cynthia Carson is a pleasant 65 year old female with a history of 48-pack-year smoking history, diabetes mellitus, dyslipidemia, presented to the emergency department on 11/23/2015 with complaints of slurred speech that have been present for the past 4 days. She reported recently being treated 14 acquired pneumonia with azithromycin. She does not recall having a recent  chest x-ray. Slurred speech was further worked up with a CT scan of brain without contrast that revealed multiple hemorrhagic mass lesions within the brain. She was further worked up with an MRI of brain that revealed a 2.5 x 3.6 x 2.2 cm hemorrhagic metastatic disease at right cerebellar hemisphere with additional too hemorrhagic metastatic lesions at right parietal and occipital lobes. CT scan of lungs with IV contrast showed central left lower lobe 3.8 cm lung mass having appearance of primary bronchogenic carcinoma. She was started on steroid therapy. Case was discussed with pulmonary critical care medicine for bronchoscopy. Radiation oncology has also been consulted.  Objective: Filed Vitals:   11/23/15 1920 11/24/15 0503  BP: 156/88 123/90  Pulse: 91 98  Temp: 98.1 F (36.7 C) 97.5 F (36.4 C)  Resp: 20 19    Intake/Output Summary (Last 24 hours) at 11/24/15 1253 Last data filed at 11/24/15 1018  Gross per 24 hour  Intake 1443.75 ml  Output   2500 ml  Net -1056.25 ml   Filed Weights   11/23/15 1920  Weight: 74.8 kg (164 lb 14.5 oz)    Exam:   General:  Patient continues to have slurred speech although is awake alert no acute distress  Cardiovascular: regular rate and rhythm normal S1-S2 no murmurs rubs or gallops  Respiratory: few scattered respiratory wheezes, otherwise no crackles or rales, normal respiratory effort  Abdomen: soft nontender nondistended  Musculoskeletal: no edema  Data Reviewed: Basic Metabolic Panel:  Recent Labs Lab 11/23/15 1235 11/23/15 1645 11/23/15 2303  NA 140 140  --   K 3.1* 3.5  --   CL 104 103  --   CO2 27 26  --   GLUCOSE 257* 242*  --   BUN 10 6  --   CREATININE 0.74 0.65  --  CALCIUM 9.7 9.9  --   MG  --   --  1.7   Liver Function Tests:  Recent Labs Lab 11/23/15 1235 11/23/15 1645  AST 13* 15  ALT 13* 14  ALKPHOS 69 66  BILITOT 0.7 0.7  PROT 7.8 7.7  ALBUMIN 3.7 3.0*   No results for input(s): LIPASE,  AMYLASE in the last 168 hours. No results for input(s): AMMONIA in the last 168 hours. CBC:  Recent Labs Lab 11/23/15 1235 11/23/15 1645  WBC 8.0 8.2  NEUTROABS 5.4 5.4  HGB 12.2 12.0  HCT 36.5 36.1  MCV 89.3 89.8  PLT 360 369   Cardiac Enzymes: No results for input(s): CKTOTAL, CKMB, CKMBINDEX, TROPONINI in the last 168 hours. BNP (last 3 results) No results for input(s): BNP in the last 8760 hours.  ProBNP (last 3 results) No results for input(s): PROBNP in the last 8760 hours.  CBG:  Recent Labs Lab 11/23/15 2252 11/24/15 0726 11/24/15 1143  GLUCAP 347* 281* 291*    No results found for this or any previous visit (from the past 240 hour(s)).   Studies: Dg Chest 2 View  11/23/2015  CLINICAL DATA:  Shortness of Breath EXAM: CHEST  2 VIEW COMPARISON:  August 02, 2009 FINDINGS: There is airspace consolidation in the medial segment of the left lower lobe with volume loss. The lungs elsewhere clear. Heart size and pulmonary vascularity are normal. There is slight prominence in the azygos region compared to prior study. No other evidence suggesting potential adenopathy. No bone lesions. IMPRESSION: Airspace consolidation with volume loss medial segment left lower lobe. Suspect pneumonia, although an endobronchial lesion causing obstruction of the medial segment left lower lobe bronchus is a possibility. Lungs elsewhere clear. There is prominence in the azygos region compared to the prior study. The possibility of localized lymph node enlargement must be of concern given this appearance unchanged from prior study. Given the findings above, contrast enhanced chest CT would be advisable to further assess. Electronically Signed   By: Lowella Grip III M.D.   On: 11/23/2015 13:51   Ct Head Wo Contrast  11/23/2015  CLINICAL DATA:  Slurred speech since 11/15/2015. EXAM: CT HEAD WITHOUT CONTRAST TECHNIQUE: Contiguous axial images were obtained from the base of the skull through the  vertex without intravenous contrast. COMPARISON:  None. FINDINGS: The patient has multiple hemorrhagic mass lesions in the brain including a 3.5 cm hemorrhagic mass in the right cerebellar hemisphere which has a slight mass effect upon the fourth ventricle. There is a 16 mm hemorrhagic metastasis in the posterior aspect of the right occipital lobe and there is a 15 mm hemorrhagic metastasis high in the right parietal lobe. There are several small focal areas of lucency in the periventricular white matter of frontal lobes. There is no midline shift. No acute osseous abnormality. IMPRESSION: Multiple hemorrhagic brain lesions. This most likely represents metastatic disease. Melanoma, renal cell carcinoma, choriocarcinoma, thyroid carcinoma and lung cancer, breast cancer, and hepatocellular carcinoma are the most common hemorrhagic metastases to the brain. Electronically Signed   By: Lorriane Shire M.D.   On: 11/23/2015 12:53   Ct Chest W Contrast  11/24/2015  CLINICAL DATA:  Inpatient with brain metastases of uncertain primary EXAM: CT CHEST, ABDOMEN, AND PELVIS WITH CONTRAST TECHNIQUE: Multidetector CT imaging of the chest, abdomen and pelvis was performed following the standard protocol during bolus administration of intravenous contrast. CONTRAST:  154m OMNIPAQUE IOHEXOL 300 MG/ML  SOLN COMPARISON:  Chest radiograph from one day  prior. FINDINGS: CT CHEST Mediastinum/Nodes: Normal heart size. Trace pericardial fluid/thickening. Atherosclerotic nonaneurysmal thoracic aorta. Normal caliber pulmonary arteries. No central pulmonary emboli. Normal visualized thyroid. Normal esophagus. No axillary lymphadenopathy. There is a mildly enlarged 1.0 cm right supraclavicular lymph node (series 201/ image 7). There are bulky confluent enlarged right paratracheal lymph nodes, largest 3.1 cm (series 201/image 18). There is a bulky 3.5 cm subcarinal node (series 201/image 27). There is an enlarged 1.3 cm prevascular lymph node  in the upper left mediastinum (series 201/image 13). There is confluent right hilar lymphadenopathy, with a dominant 3.0 cm right hilar node (series 201/image 25). Lungs/Pleura: No pneumothorax. No pleural effusion. There is an approximately 3.8 x 2.5 cm lung mass in the central left lower lobe (series 205/ image 27), which occludes the left lower lobe bronchus, with associated complete left lower lobe atelectasis. No acute consolidative airspace disease, significant pulmonary nodules or lung masses in the remaining lung lobes. Musculoskeletal:  No aggressive appearing focal osseous lesions. CT ABDOMEN AND PELVIS Hepatobiliary: Normal liver with no liver mass. Normal gallbladder with no radiopaque cholelithiasis. No biliary ductal dilatation. Pancreas: Normal, with no mass or duct dilation. Spleen: Normal size. No mass. Adrenals/Urinary Tract: Normal right adrenal. A 1.6 cm left adrenal mass demonstrates indeterminate density of 93 HU. There is a 3.4 x 3.0 cm solid mass in the upper left kidney (series 301/image 80), which demonstrates slight hypoenhancement relative to the renal parenchyma. No hydronephrosis. Normal bladder. Stomach/Bowel: Grossly normal stomach. Normal caliber small bowel with no small bowel wall thickening. Normal appendix. Normal large bowel with no diverticulosis, large bowel wall thickening or pericolonic fat stranding. Vascular/Lymphatic: Atherosclerotic nonaneurysmal abdominal aorta. Patent portal, splenic, hepatic and renal veins. There is a 1.2 x 1.1 cm mass in the left lower quadrant mesenteric (series 201/ image 96). Otherwise no lymphadenopathy in the abdomen or pelvis. Reproductive: Status post hysterectomy, with no abnormal findings at the vaginal cuff. No adnexal mass. Other: No pneumoperitoneum, ascites or focal fluid collection. Musculoskeletal: No aggressive appearing focal osseous lesions. IMPRESSION: 1. Central left lower lobe 3.8 cm lung mass, most in keeping with a primary  bronchogenic carcinoma, which occludes the left lower lobe bronchus, with associated complete left lower lobe atelectasis. 2. Bulky ipsilateral, subcarinal and contralateral mediastinal lymphadenopathy. Bulky contralateral hilar lymphadenopathy. Contralateral supraclavicular lymphadenopathy. 3. Trace pericardial fluid/thickening.  No pleural effusion. 4. Solid 3.4 cm renal mass in the upper left kidney, favor a renal metastasis. 5. Indeterminate left adrenal 1.6 cm mass, probably an adrenal metastasis. 6. Left lower quadrant 1.2 cm mesenteric mass, favor a hematogenous metastasis. Electronically Signed   By: Ilona Sorrel M.D.   On: 11/24/2015 08:16   Mr Jeri Cos YN Contrast  11/23/2015  CLINICAL DATA:  Initial evaluation for intracranial metastasis. EXAM: MRI HEAD WITHOUT AND WITH CONTRAST TECHNIQUE: Multiplanar, multiecho pulse sequences of the brain and surrounding structures were obtained without and with intravenous contrast. CONTRAST:  27m MULTIHANCE GADOBENATE DIMEGLUMINE 529 MG/ML IV SOLN COMPARISON:  Prior CT from earlier the same day. FINDINGS: Age appropriate cerebral atrophy present. Patchy T2/FLAIR hyperintensity within the periventricular, deep, and subcortical white matter both cerebral hemispheres present, most like related to mild chronic small vessel ischemic disease. No acute infarct. Major intracranial vascular flow voids maintained. Previously identified hemorrhagic mass centered at the right cerebellar hemisphere again seen, measuring 2.5 x 3.6 x 2.2 cm (AP by transverse by craniocaudad). Internal fluid fluid level present within this lesion. There is mild localized vasogenic edema with partial  effacement of the fourth ventricle which remains patent. No hydrocephalus. Cerebellar tonsils are somewhat low lying at the upper limits of normal measuring 5-6 mm below the foramen magnum. Lesion abuts the right tentorium superiorly. There is focal enhancement along the tentorium itself (series 12,  image 6). Second cortically based lesion within the right occipital lobe measures 1.4 x 1.5 x 1.3 cm (AP by transverse by craniocaudad). This lesion is also hemorrhagic in nature with internal fluid fluid level. Mild surrounding vasogenic edema without significant mass effect. Third lesion centered within the parasagittal high right parietal lobe measures 17 x 16 x 18 mm (AP by transverse by craniocaudad). This lesion is somewhat cystic in nature with minimal internal blood products. Mild localized vasogenic edema without significant mass effect. Possible additional tiny 6 mm lesion within the right occipital lobe without significant mass effect (series 11, image 13). Faint brush like enhancement measuring approximately 6 mm present within the parasagittal right frontal lobe (series 11, image 19). Indeterminate. Pituitary gland normal. No acute abnormality about the orbits. Sequela prior bilateral lens extraction noted. Paranasal sinuses are clear. No mastoid effusion. Inner ear structures normal. Bone marrow signal intensity within normal limits. IMPRESSION: 1. 2.5 x 3.6 x 2.2 cm hemorrhagic metastasis centered at the right cerebellar hemisphere. There is mild localized vasogenic edema with mild mass effect on the adjacent fourth ventricle without hydrocephalus. 2. Additional 2 hemorrhagic metastatic lesions within the right parietal and occipital lobes as above. Mild localized edema without significant mass effect. 3. Additional possible tiny 6 mm metastasis within the right occipital lobe as above. Attention at follow-up. 4. Faint brush like enhancement within the parasagittal right frontal lobe as above, indeterminate. Attention at follow-up. 5. Cerebellar tonsils positioned at the upper limits of normal 5-6 mm below the foramen magnum. This is felt to be likely incidental and not due to edema from the right cerebellar metastasis. Electronically Signed   By: Jeannine Boga M.D.   On: 11/23/2015 22:00    Ct Abdomen Pelvis W Contrast  11/24/2015  CLINICAL DATA:  Inpatient with brain metastases of uncertain primary EXAM: CT CHEST, ABDOMEN, AND PELVIS WITH CONTRAST TECHNIQUE: Multidetector CT imaging of the chest, abdomen and pelvis was performed following the standard protocol during bolus administration of intravenous contrast. CONTRAST:  155m OMNIPAQUE IOHEXOL 300 MG/ML  SOLN COMPARISON:  Chest radiograph from one day prior. FINDINGS: CT CHEST Mediastinum/Nodes: Normal heart size. Trace pericardial fluid/thickening. Atherosclerotic nonaneurysmal thoracic aorta. Normal caliber pulmonary arteries. No central pulmonary emboli. Normal visualized thyroid. Normal esophagus. No axillary lymphadenopathy. There is a mildly enlarged 1.0 cm right supraclavicular lymph node (series 201/ image 7). There are bulky confluent enlarged right paratracheal lymph nodes, largest 3.1 cm (series 201/image 18). There is a bulky 3.5 cm subcarinal node (series 201/image 27). There is an enlarged 1.3 cm prevascular lymph node in the upper left mediastinum (series 201/image 13). There is confluent right hilar lymphadenopathy, with a dominant 3.0 cm right hilar node (series 201/image 25). Lungs/Pleura: No pneumothorax. No pleural effusion. There is an approximately 3.8 x 2.5 cm lung mass in the central left lower lobe (series 205/ image 27), which occludes the left lower lobe bronchus, with associated complete left lower lobe atelectasis. No acute consolidative airspace disease, significant pulmonary nodules or lung masses in the remaining lung lobes. Musculoskeletal:  No aggressive appearing focal osseous lesions. CT ABDOMEN AND PELVIS Hepatobiliary: Normal liver with no liver mass. Normal gallbladder with no radiopaque cholelithiasis. No biliary ductal dilatation. Pancreas: Normal, with  no mass or duct dilation. Spleen: Normal size. No mass. Adrenals/Urinary Tract: Normal right adrenal. A 1.6 cm left adrenal mass demonstrates  indeterminate density of 93 HU. There is a 3.4 x 3.0 cm solid mass in the upper left kidney (series 301/image 80), which demonstrates slight hypoenhancement relative to the renal parenchyma. No hydronephrosis. Normal bladder. Stomach/Bowel: Grossly normal stomach. Normal caliber small bowel with no small bowel wall thickening. Normal appendix. Normal large bowel with no diverticulosis, large bowel wall thickening or pericolonic fat stranding. Vascular/Lymphatic: Atherosclerotic nonaneurysmal abdominal aorta. Patent portal, splenic, hepatic and renal veins. There is a 1.2 x 1.1 cm mass in the left lower quadrant mesenteric (series 201/ image 96). Otherwise no lymphadenopathy in the abdomen or pelvis. Reproductive: Status post hysterectomy, with no abnormal findings at the vaginal cuff. No adnexal mass. Other: No pneumoperitoneum, ascites or focal fluid collection. Musculoskeletal: No aggressive appearing focal osseous lesions. IMPRESSION: 1. Central left lower lobe 3.8 cm lung mass, most in keeping with a primary bronchogenic carcinoma, which occludes the left lower lobe bronchus, with associated complete left lower lobe atelectasis. 2. Bulky ipsilateral, subcarinal and contralateral mediastinal lymphadenopathy. Bulky contralateral hilar lymphadenopathy. Contralateral supraclavicular lymphadenopathy. 3. Trace pericardial fluid/thickening.  No pleural effusion. 4. Solid 3.4 cm renal mass in the upper left kidney, favor a renal metastasis. 5. Indeterminate left adrenal 1.6 cm mass, probably an adrenal metastasis. 6. Left lower quadrant 1.2 cm mesenteric mass, favor a hematogenous metastasis. Electronically Signed   By: Ilona Sorrel M.D.   On: 11/24/2015 08:16    Scheduled Meds: . dexamethasone  4 mg Oral 4 times per day  . fluconazole  100 mg Oral Daily  . insulin aspart  0-9 Units Subcutaneous TID WC  . latanoprost  1 drop Both Eyes QHS  . levETIRAcetam  500 mg Oral BID  . omega-3 acid ethyl esters  2 g Oral  BID  . pantoprazole  40 mg Oral Daily   Continuous Infusions: . 0.9 % NaCl with KCl 20 mEq / L 75 mL/hr at 11/24/15 1155    Active Problems:   Metastatic cancer ([place])   Metastatic cancer to brain of unknown cell type ([place])   COPD (chronic obstructive pulmonary disease) ([place])   Dysarthria   Hypokalemia    Time spent: 35 minutes   Kelvin Cellar  [facility] Pager [phone]. If 7PM-7AM, please contact night-coverage at [email or web address], password [facility] 11/24/2015, 12:53 PM  LOS: 1 day

## 2015-11-24 NOTE — Consult Note (Signed)
Chief Complaint: Patient was seen in consultation today for Right supraclavicular lymph node biopsy Chief Complaint  Patient presents with  . Aphasia   at the request of Dr Chase Caller  Referring Physician(s): Dr Chase Caller  History of Present Illness: Cynthia Carson is a 65 y.o. female   Admitted with slurred speech Work up MRI revealed brain mets CT: IMPRESSION: 1. Central left lower lobe 3.8 cm lung mass, most in keeping with a primary bronchogenic carcinoma, which occludes the left lower lobe bronchus, with associated complete left lower lobe atelectasis. 2. Bulky ipsilateral, subcarinal and contralateral mediastinal lymphadenopathy. Bulky contralateral hilar lymphadenopathy. Contralateral supraclavicular lymphadenopathy. 3. Trace pericardial fluid/thickening. No pleural effusion. 4. Solid 3.4 cm renal mass in the upper left kidney, favor a renal metastasis. 5. Indeterminate left adrenal 1.6 cm mass, probably an adrenal metastasis. 6. Left lower quadrant 1.2 cm mesenteric mass, favor a hematogenous metastasis.  Request made for R supraclavicular LN biopsy Dr Anselm Pancoast has reviewed imaging and approves procedure I have seen and examined pt  Past Medical History  Diagnosis Date  . Diabetes mellitus without complication [facility])     Past Surgical History  Procedure Laterality Date  . Abdominal hysterectomy      Allergies: Amoxicillin and Levofloxacin  Medications: Prior to Admission medications   Medication Sig Start Date End Date Taking? Authorizing Provider  acetaminophen (TYLENOL) 325 MG tablet Take 650 mg by mouth every 6 (six) hours as needed for mild pain.   Yes Historical Provider, MD  ALPRAZolam Duanne Moron) 0.5 MG tablet Take 0.5 mg by mouth 3 (three) times daily as needed for anxiety.    Yes Historical Provider, MD  latanoprost (XALATAN) 0.005 % ophthalmic solution Place 1 drop into both eyes at bedtime.   Yes Historical Provider, MD  Omega-3 Fatty Acids (FISH  OIL) 1000 MG CAPS Take 2,000 mg by mouth 2 (two) times daily.    Yes Historical Provider, MD  omeprazole (PRILOSEC) 20 MG capsule Take 20 mg by mouth 2 (two) times daily.    Yes Historical Provider, MD  sitaGLIPtin (JANUVIA) 100 MG tablet Take 100 mg by mouth daily.   Yes Historical Provider, MD     History reviewed. No pertinent family history.  Social History   Social History  . Marital Status: Married    Spouse Name: N/A  . Number of Children: N/A  . Years of Education: N/A   Social History Main Topics  . Smoking status: Former Smoker    Quit date: 11/19/2015  . Smokeless tobacco: None  . Alcohol Use: No  . Drug Use: No  . Sexual Activity: Yes    Birth Control/ Protection: Surgical   Other Topics Concern  . None   Social History Narrative     Review of Systems: A 12 point ROS discussed and pertinent positives are indicated in the HPI above.  All other systems are negative.  Review of Systems  Constitutional: Positive for activity change, appetite change and fatigue. Negative for fever.  HENT: Positive for voice change.   Eyes: Negative for visual disturbance.  Respiratory: Negative for cough and shortness of breath.   Gastrointestinal: Positive for nausea.  Neurological: Positive for speech difficulty, weakness and light-headedness. Negative for dizziness, tremors, seizures, syncope, facial asymmetry, numbness and headaches.  Psychiatric/Behavioral: Negative for behavioral problems and confusion.    Vital Signs: BP 128/76 mmHg  Pulse 99  Temp(Src) 98 F (36.7 C) (Oral)  Resp 20  Ht '5\' 5"'$  (1.651 m)  Wt 164 lb  14.5 oz (74.8 kg)  BMI 27.44 kg/m2  SpO2 97%  Physical Exam  Constitutional: She is oriented to person, place, and time.  Cardiovascular: Normal rate and regular rhythm.   Pulmonary/Chest: Effort normal. She has no wheezes.  Abdominal: Soft. Bowel sounds are normal. There is no tenderness.  Musculoskeletal: Normal range of motion.  Neurological: She  is alert and oriented to person, place, and time.  Groggy   Skin: Skin is warm and dry.  Psychiatric: She has a normal mood and affect. Her behavior is normal.  Consented with husband at bedside  Nursing note and vitals reviewed.   Mallampati Score:  MD Evaluation Airway: WNL Heart: WNL Abdomen: WNL Chest/ Lungs: WNL ASA  Classification: 3 Mallampati/Airway Score: One  Imaging: Dg Chest 2 View  11/23/2015  CLINICAL DATA:  Shortness of Breath EXAM: CHEST  2 VIEW COMPARISON:  August 02, 2009 FINDINGS: There is airspace consolidation in the medial segment of the left lower lobe with volume loss. The lungs elsewhere clear. Heart size and pulmonary vascularity are normal. There is slight prominence in the azygos region compared to prior study. No other evidence suggesting potential adenopathy. No bone lesions. IMPRESSION: Airspace consolidation with volume loss medial segment left lower lobe. Suspect pneumonia, although an endobronchial lesion causing obstruction of the medial segment left lower lobe bronchus is a possibility. Lungs elsewhere clear. There is prominence in the azygos region compared to the prior study. The possibility of localized lymph node enlargement must be of concern given this appearance unchanged from prior study. Given the findings above, contrast enhanced chest CT would be advisable to further assess. Electronically Signed   By: Lowella Grip III M.D.   On: 11/23/2015 13:51   Ct Head Wo Contrast  11/23/2015  CLINICAL DATA:  Slurred speech since 11/15/2015. EXAM: CT HEAD WITHOUT CONTRAST TECHNIQUE: Contiguous axial images were obtained from the base of the skull through the vertex without intravenous contrast. COMPARISON:  None. FINDINGS: The patient has multiple hemorrhagic mass lesions in the brain including a 3.5 cm hemorrhagic mass in the right cerebellar hemisphere which has a slight mass effect upon the fourth ventricle. There is a 16 mm hemorrhagic metastasis in  the posterior aspect of the right occipital lobe and there is a 15 mm hemorrhagic metastasis high in the right parietal lobe. There are several small focal areas of lucency in the periventricular white matter of frontal lobes. There is no midline shift. No acute osseous abnormality. IMPRESSION: Multiple hemorrhagic brain lesions. This most likely represents metastatic disease. Melanoma, renal cell carcinoma, choriocarcinoma, thyroid carcinoma and lung cancer, breast cancer, and hepatocellular carcinoma are the most common hemorrhagic metastases to the brain. Electronically Signed   By: Lorriane Shire M.D.   On: 11/23/2015 12:53   Ct Chest W Contrast  11/24/2015  CLINICAL DATA:  Inpatient with brain metastases of uncertain primary EXAM: CT CHEST, ABDOMEN, AND PELVIS WITH CONTRAST TECHNIQUE: Multidetector CT imaging of the chest, abdomen and pelvis was performed following the standard protocol during bolus administration of intravenous contrast. CONTRAST:  176m OMNIPAQUE IOHEXOL 300 MG/ML  SOLN COMPARISON:  Chest radiograph from one day prior. FINDINGS: CT CHEST Mediastinum/Nodes: Normal heart size. Trace pericardial fluid/thickening. Atherosclerotic nonaneurysmal thoracic aorta. Normal caliber pulmonary arteries. No central pulmonary emboli. Normal visualized thyroid. Normal esophagus. No axillary lymphadenopathy. There is a mildly enlarged 1.0 cm right supraclavicular lymph node (series 201/ image 7). There are bulky confluent enlarged right paratracheal lymph nodes, largest 3.1 cm (series 201/image 18). There  is a bulky 3.5 cm subcarinal node (series 201/image 27). There is an enlarged 1.3 cm prevascular lymph node in the upper left mediastinum (series 201/image 13). There is confluent right hilar lymphadenopathy, with a dominant 3.0 cm right hilar node (series 201/image 25). Lungs/Pleura: No pneumothorax. No pleural effusion. There is an approximately 3.8 x 2.5 cm lung mass in the central left lower lobe  (series 205/ image 27), which occludes the left lower lobe bronchus, with associated complete left lower lobe atelectasis. No acute consolidative airspace disease, significant pulmonary nodules or lung masses in the remaining lung lobes. Musculoskeletal:  No aggressive appearing focal osseous lesions. CT ABDOMEN AND PELVIS Hepatobiliary: Normal liver with no liver mass. Normal gallbladder with no radiopaque cholelithiasis. No biliary ductal dilatation. Pancreas: Normal, with no mass or duct dilation. Spleen: Normal size. No mass. Adrenals/Urinary Tract: Normal right adrenal. A 1.6 cm left adrenal mass demonstrates indeterminate density of 93 HU. There is a 3.4 x 3.0 cm solid mass in the upper left kidney (series 301/image 80), which demonstrates slight hypoenhancement relative to the renal parenchyma. No hydronephrosis. Normal bladder. Stomach/Bowel: Grossly normal stomach. Normal caliber small bowel with no small bowel wall thickening. Normal appendix. Normal large bowel with no diverticulosis, large bowel wall thickening or pericolonic fat stranding. Vascular/Lymphatic: Atherosclerotic nonaneurysmal abdominal aorta. Patent portal, splenic, hepatic and renal veins. There is a 1.2 x 1.1 cm mass in the left lower quadrant mesenteric (series 201/ image 96). Otherwise no lymphadenopathy in the abdomen or pelvis. Reproductive: Status post hysterectomy, with no abnormal findings at the vaginal cuff. No adnexal mass. Other: No pneumoperitoneum, ascites or focal fluid collection. Musculoskeletal: No aggressive appearing focal osseous lesions. IMPRESSION: 1. Central left lower lobe 3.8 cm lung mass, most in keeping with a primary bronchogenic carcinoma, which occludes the left lower lobe bronchus, with associated complete left lower lobe atelectasis. 2. Bulky ipsilateral, subcarinal and contralateral mediastinal lymphadenopathy. Bulky contralateral hilar lymphadenopathy. Contralateral supraclavicular lymphadenopathy. 3.  Trace pericardial fluid/thickening.  No pleural effusion. 4. Solid 3.4 cm renal mass in the upper left kidney, favor a renal metastasis. 5. Indeterminate left adrenal 1.6 cm mass, probably an adrenal metastasis. 6. Left lower quadrant 1.2 cm mesenteric mass, favor a hematogenous metastasis. Electronically Signed   By: Ilona Sorrel M.D.   On: 11/24/2015 08:16   Mr Jeri Cos JI Contrast  11/23/2015  CLINICAL DATA:  Initial evaluation for intracranial metastasis. EXAM: MRI HEAD WITHOUT AND WITH CONTRAST TECHNIQUE: Multiplanar, multiecho pulse sequences of the brain and surrounding structures were obtained without and with intravenous contrast. CONTRAST:  83m MULTIHANCE GADOBENATE DIMEGLUMINE 529 MG/ML IV SOLN COMPARISON:  Prior CT from earlier the same day. FINDINGS: Age appropriate cerebral atrophy present. Patchy T2/FLAIR hyperintensity within the periventricular, deep, and subcortical white matter both cerebral hemispheres present, most like related to mild chronic small vessel ischemic disease. No acute infarct. Major intracranial vascular flow voids maintained. Previously identified hemorrhagic mass centered at the right cerebellar hemisphere again seen, measuring 2.5 x 3.6 x 2.2 cm (AP by transverse by craniocaudad). Internal fluid fluid level present within this lesion. There is mild localized vasogenic edema with partial effacement of the fourth ventricle which remains patent. No hydrocephalus. Cerebellar tonsils are somewhat low lying at the upper limits of normal measuring 5-6 mm below the foramen magnum. Lesion abuts the right tentorium superiorly. There is focal enhancement along the tentorium itself (series 12, image 6). Second cortically based lesion within the right occipital lobe measures 1.4 x 1.5 x 1.3  cm (AP by transverse by craniocaudad). This lesion is also hemorrhagic in nature with internal fluid fluid level. Mild surrounding vasogenic edema without significant mass effect. Third lesion centered  within the parasagittal high right parietal lobe measures 17 x 16 x 18 mm (AP by transverse by craniocaudad). This lesion is somewhat cystic in nature with minimal internal blood products. Mild localized vasogenic edema without significant mass effect. Possible additional tiny 6 mm lesion within the right occipital lobe without significant mass effect (series 11, image 13). Faint brush like enhancement measuring approximately 6 mm present within the parasagittal right frontal lobe (series 11, image 19). Indeterminate. Pituitary gland normal. No acute abnormality about the orbits. Sequela prior bilateral lens extraction noted. Paranasal sinuses are clear. No mastoid effusion. Inner ear structures normal. Bone marrow signal intensity within normal limits. IMPRESSION: 1. 2.5 x 3.6 x 2.2 cm hemorrhagic metastasis centered at the right cerebellar hemisphere. There is mild localized vasogenic edema with mild mass effect on the adjacent fourth ventricle without hydrocephalus. 2. Additional 2 hemorrhagic metastatic lesions within the right parietal and occipital lobes as above. Mild localized edema without significant mass effect. 3. Additional possible tiny 6 mm metastasis within the right occipital lobe as above. Attention at follow-up. 4. Faint brush like enhancement within the parasagittal right frontal lobe as above, indeterminate. Attention at follow-up. 5. Cerebellar tonsils positioned at the upper limits of normal 5-6 mm below the foramen magnum. This is felt to be likely incidental and not due to edema from the right cerebellar metastasis. Electronically Signed   By: Jeannine Boga M.D.   On: 11/23/2015 22:00   Ct Abdomen Pelvis W Contrast  11/24/2015  CLINICAL DATA:  Inpatient with brain metastases of uncertain primary EXAM: CT CHEST, ABDOMEN, AND PELVIS WITH CONTRAST TECHNIQUE: Multidetector CT imaging of the chest, abdomen and pelvis was performed following the standard protocol during bolus  administration of intravenous contrast. CONTRAST:  168m OMNIPAQUE IOHEXOL 300 MG/ML  SOLN COMPARISON:  Chest radiograph from one day prior. FINDINGS: CT CHEST Mediastinum/Nodes: Normal heart size. Trace pericardial fluid/thickening. Atherosclerotic nonaneurysmal thoracic aorta. Normal caliber pulmonary arteries. No central pulmonary emboli. Normal visualized thyroid. Normal esophagus. No axillary lymphadenopathy. There is a mildly enlarged 1.0 cm right supraclavicular lymph node (series 201/ image 7). There are bulky confluent enlarged right paratracheal lymph nodes, largest 3.1 cm (series 201/image 18). There is a bulky 3.5 cm subcarinal node (series 201/image 27). There is an enlarged 1.3 cm prevascular lymph node in the upper left mediastinum (series 201/image 13). There is confluent right hilar lymphadenopathy, with a dominant 3.0 cm right hilar node (series 201/image 25). Lungs/Pleura: No pneumothorax. No pleural effusion. There is an approximately 3.8 x 2.5 cm lung mass in the central left lower lobe (series 205/ image 27), which occludes the left lower lobe bronchus, with associated complete left lower lobe atelectasis. No acute consolidative airspace disease, significant pulmonary nodules or lung masses in the remaining lung lobes. Musculoskeletal:  No aggressive appearing focal osseous lesions. CT ABDOMEN AND PELVIS Hepatobiliary: Normal liver with no liver mass. Normal gallbladder with no radiopaque cholelithiasis. No biliary ductal dilatation. Pancreas: Normal, with no mass or duct dilation. Spleen: Normal size. No mass. Adrenals/Urinary Tract: Normal right adrenal. A 1.6 cm left adrenal mass demonstrates indeterminate density of 93 HU. There is a 3.4 x 3.0 cm solid mass in the upper left kidney (series 301/image 80), which demonstrates slight hypoenhancement relative to the renal parenchyma. No hydronephrosis. Normal bladder. Stomach/Bowel: Grossly normal stomach. Normal  caliber small bowel with no  small bowel wall thickening. Normal appendix. Normal large bowel with no diverticulosis, large bowel wall thickening or pericolonic fat stranding. Vascular/Lymphatic: Atherosclerotic nonaneurysmal abdominal aorta. Patent portal, splenic, hepatic and renal veins. There is a 1.2 x 1.1 cm mass in the left lower quadrant mesenteric (series 201/ image 96). Otherwise no lymphadenopathy in the abdomen or pelvis. Reproductive: Status post hysterectomy, with no abnormal findings at the vaginal cuff. No adnexal mass. Other: No pneumoperitoneum, ascites or focal fluid collection. Musculoskeletal: No aggressive appearing focal osseous lesions. IMPRESSION: 1. Central left lower lobe 3.8 cm lung mass, most in keeping with a primary bronchogenic carcinoma, which occludes the left lower lobe bronchus, with associated complete left lower lobe atelectasis. 2. Bulky ipsilateral, subcarinal and contralateral mediastinal lymphadenopathy. Bulky contralateral hilar lymphadenopathy. Contralateral supraclavicular lymphadenopathy. 3. Trace pericardial fluid/thickening.  No pleural effusion. 4. Solid 3.4 cm renal mass in the upper left kidney, favor a renal metastasis. 5. Indeterminate left adrenal 1.6 cm mass, probably an adrenal metastasis. 6. Left lower quadrant 1.2 cm mesenteric mass, favor a hematogenous metastasis. Electronically Signed   By: Ilona Sorrel M.D.   On: 11/24/2015 08:16    Labs:  CBC:  Recent Labs  11/23/15 1235 11/23/15 1645  WBC 8.0 8.2  HGB 12.2 12.0  HCT 36.5 36.1  PLT 360 369    COAGS:  Recent Labs  11/23/15 1235 11/23/15 2303  INR 1.08 1.14  APTT 35 35    BMP:  Recent Labs  11/23/15 1235 11/23/15 1645  NA 140 140  K 3.1* 3.5  CL 104 103  CO2 27 26  GLUCOSE 257* 242*  BUN 10 6  CALCIUM 9.7 9.9  CREATININE 0.74 0.65  GFRNONAA >60 >60  GFRAA >60 >60    LIVER FUNCTION TESTS:  Recent Labs  11/23/15 1235 11/23/15 1645  BILITOT 0.7 0.7  AST 13* 15  ALT 13* 14  ALKPHOS 69  66  PROT 7.8 7.7  ALBUMIN 3.7 3.0*    TUMOR MARKERS: No results for input(s): AFPTM, CEA, CA199, CHROMGRNA in the last 8760 hours.  Assessment and Plan:  Slurred speech Work up reveals brain mets Left lung mass; adrenal and renal mass Lymphadenopathy Now scheduled for R SCLN bx in am Risks and Benefits discussed with the patient and husband including, but not limited to bleeding, infection, damage to adjacent structures or low yield requiring additional tests. All of the patient's questions were answered, patient is agreeable to proceed. Consent signed and in chart.    Thank you for this interesting consult.  I greatly enjoyed meeting Cynthia Carson and look forward to participating in their care.  A copy of this report was sent to the requesting provider on this date.  Signed: Evans Levee A 11/24/2015, 3:22 PM   I spent a total of 40 Minutes    in face to face in clinical consultation, greater than 50% of which was counseling/coordinating care for R supraclavicular lymph node bx

## 2015-11-24 NOTE — Care Management Note (Signed)
Case Management Note  Patient Details  Name: Cynthia Carson MRN: [id number] Date of Birth: 11/02/1951  Subjective/Objective:                    Action/Plan:  Continuing to follow . Initial UR completed .  Expected Discharge Date:  11/26/15               Expected Discharge Plan:  Home/Self Care  In-House Referral:     Discharge planning Services     Post Acute Care Choice:    Choice offered to:     DME Arranged:    DME Agency:     HH Arranged:    HH Agency:     Status of Service:  In process, will continue to follow  Medicare Important Message Given:    Date Medicare IM Given:    Medicare IM give by:    Date Additional Medicare IM Given:    Additional Medicare Important Message give by:     If discussed at [place] of Stay Meetings, dates discussed:    Additional Comments:  Marilu Favre, RN 11/24/2015, 10:42 AM

## 2015-11-24 NOTE — Consult Note (Signed)
STAFF NOTE: I, Dr Ann Lions have personally reviewed patient's available data, including medical history, events of note, physical examination and test results as part of my evaluation. I have discussed with resident/NP and other care providers such as pharmacist, RN and RRT.  In addition,  I personally evaluated patient and elicited key findings of   S: ex-smokler, 65 year female. Admitted 11/23/2015 with slurred speech. Found to have cerebral mets. Investigations have revealed extensive adenopahty - mediastinal and supraclaviculr adenopathy. With LLL 3.8cm mass, trace pericardia effusion, left adrenal mass and renal mass with right supra-clav node 1cm  O: Sitting eating Looks well Oriented x 3 Mild slurred speech -s ays is better  CT = as above visualized  A: High suspicision extensive stage smal cell lung carcinoma  P: Recommend IR guided biopsy or Rt supra-clav or left adrenal mass as firs step =- d/w Dr Anselm Pancoast of IR - he will review and get back to me. If this is not feasible, then provisional EBUS on Monday 11/28/15 1pm - asking for slot in OR  D/w hospitalist Updated patient and husband   .  Rest per NP/medical resident whose note is outlined above and that I agree with   Dr. Brand Males, M.D., [facility].C.P Pulmonary and Critical Care Medicine Staff Physician [place] [facility] Pager: [phone], If no answer or between  15:00h - 7:00h: call [phone]  11/24/2015 2:16 PM

## 2015-11-25 ENCOUNTER — Inpatient Hospital Stay (HOSPITAL_COMMUNITY): Payer: BLUE CROSS/BLUE SHIELD

## 2015-11-25 LAB — GLUCOSE, CAPILLARY
GLUCOSE-CAPILLARY: 285 mg/dL — AB (ref 65–99)
GLUCOSE-CAPILLARY: 247 mg/dL — AB (ref 65–99)
Glucose-Capillary: 252 mg/dL — ABNORMAL HIGH (ref 65–99)
Glucose-Capillary: 191 mg/dL — ABNORMAL HIGH (ref 65–99)

## 2015-11-25 LAB — CBC
HEMATOCRIT: 36.3 % (ref 36.0–46.0)
HEMOGLOBIN: 11.4 g/dL — AB (ref 12.0–15.0)
MCH: 29.1 pg (ref 26.0–34.0)
MCHC: 31.4 g/dL (ref 30.0–36.0)
MCV: 92.6 fL (ref 78.0–100.0)
Platelets: 410 10*3/uL — ABNORMAL HIGH (ref 150–400)
RBC: 3.92 MIL/uL (ref 3.87–5.11)
RDW: 14 % (ref 11.5–15.5)
WBC: 11.8 10*3/uL — AB (ref 4.0–10.5)

## 2015-11-25 LAB — BASIC METABOLIC PANEL
ANION GAP: 11 (ref 5–15)
BUN: 15 mg/dL (ref 6–20)
CHLORIDE: 105 mmol/L (ref 101–111)
CO2: 23 mmol/L (ref 22–32)
Calcium: 10 mg/dL (ref 8.9–10.3)
Creatinine, Ser: 0.75 mg/dL (ref 0.44–1.00)
GFR calc non Af Amer: >60 mL/min (ref >60)
GLUCOSE: 323 mg/dL — AB (ref 65–99)
POTASSIUM: 4.7 mmol/L (ref 3.5–5.1)
Sodium: 139 mmol/L (ref 135–145)

## 2015-11-25 LAB — HEMOGLOBIN A1C
Hgb A1c MFr Bld: 10.4 % — ABNORMAL HIGH (ref 4.8–5.6)
Mean Plasma Glucose: 252 mg/dL

## 2015-11-25 MED ORDER — LIDOCAINE HCL 1 % IJ SOLN
INTRAMUSCULAR | Status: AC
  Filled 2015-11-25: qty 20

## 2015-11-25 MED ORDER — INSULIN ASPART 100 UNIT/ML ~~LOC~~ SOLN
5.0000 [IU] | Freq: Three times a day (TID) | SUBCUTANEOUS | Status: DC
  Administered 2015-11-25 – 2015-11-26 (×3): 5 [IU] via SUBCUTANEOUS

## 2015-11-25 MED ORDER — INSULIN GLARGINE 100 UNIT/ML ~~LOC~~ SOLN
15.0000 [IU] | Freq: Every day | SUBCUTANEOUS | Status: DC
  Administered 2015-11-26: 15 [IU] via SUBCUTANEOUS
  Filled 2015-11-25: qty 0.15

## 2015-11-25 NOTE — Procedures (Signed)
Interventional Radiology Procedure Note  Procedure: US guided core biopsy right supraclavicular LN.  Complications: None  Estimated Blood Loss: 0  Recommendations: - OK for DC at discretion of primary team  Signed,  Criselda Peaches, MD

## 2015-11-25 NOTE — Progress Notes (Signed)
Inpatient Diabetes Program Recommendations  AACE/ADA: New Consensus Statement on Inpatient Glycemic Control (2015)  Target Ranges:  Prepandial:   less than 140 mg/dL      Peak postprandial:   less than 180 mg/dL (1-2 hours)      Critically ill patients:  140 - 180 mg/dL   Results for Cynthia Carson, Cynthia Carson (MRN [id number]) as of 11/25/2015 12:37  Ref. Range 11/24/2015 07:26 11/24/2015 11:43 11/24/2015 16:28 11/24/2015 21:49 11/25/2015 07:39 11/25/2015 11:47  Glucose-Capillary Latest Ref Range: 65-99 mg/dL 281 (H) 291 (H) 310 (H) 277 (H) 285 (H) 252 (H)   Review of Glycemic Control  Diabetes history: DM 2 Outpatient Diabetes medications: Januvia 100 mg Daily Current orders for Inpatient glycemic control: Lantus 10 units, Novolog Sensitive TID  Inpatient Diabetes Program Recommendations: Insulin - Basal: Glucose range 250-310 mg/dl. Patient on Decadron '4mg'$  4x/day. If patient continues with the decadron dose, please consider increasing basal insulin to Lantus 16-18 units.  There is also an increase in glucose at meals. May also want to add Novolog 3 units TID meal coverage if patient is eating 50% or more of meals.  Thanks,  Tama Headings RN, MSN, [facility] Inpatient Diabetes Coordinator Team Pager [phone] (8a-5p)

## 2015-11-25 NOTE — Progress Notes (Signed)
Bx site clean, dry, and intact. No signs of bleeding.

## 2015-11-25 NOTE — Progress Notes (Signed)
[facility] PROGRESS NOTE  Navaeh Kehres HER:[id number] DOB: [birth year] DOA: 11/23/2015 PCP: Marden Noble, MD  Assessment/Plan: 1. Suspected metastatic lung cancer. -Cynthia Carson having a history of tobacco abuse (47 pack years) presenting with slurred speech. Brain imaging that included CT scan and MRI revealed 2.5 x 3.6 x 2.2 cm hemorrhagic metastatic disease at right cerebellar hemisphere with 2 additional hemorrhagic metastatic lesions at parietal and occipital lobes. -CT scan of lungs with IV contrast showed central left lower lobe 3.8 cm lung mass. -Radiology reporting findings having appearance of metastatic primary bronchogenic carcinoma. -Radiation oncology has also been consulted for metastatic lesions involving brain. Patient likely benefit from radiation. -Plan to continue dexamethasone 4 mg IV QID as well as seizure prophylaxis with Keppra 500 mg by mouth twice a day. -Plan for right supraclavicular lymph node biopsy on 11/25/2015. Procedure to be performed by interventional radiology.   2.  Type 2 diabetes mellitus -Blood sugars are elevated in the 200-300 range likely precipitated by systemic steroids -Blood sugars remain elevated, will increase Lantus to 15 units subcutaneous daily and add NovoLog 5 units subcutaneous 3 times a day for meal time coverage.   Code Status: Full code Family Communication: spoke with her husband at bedside updated him on patient's condition and radiologic studies Disposition Plan: Plan for lymph node biopsy  Consultants:  Pulmonary critical care medicine  Radiation oncology  Neurosurgery  Procedures:  Right supraclavicular lymph node biopsy  HPI/Subjective: [name] is a pleasant 65 year old female with a history of 48-pack-year smoking history, diabetes mellitus, dyslipidemia, presented to the emergency department on 11/23/2015 with complaints of slurred speech that have been present for the past 4 days. She reported  recently being treated 14 acquired pneumonia with azithromycin. She does not recall having a recent chest x-ray. Slurred speech was further worked up with a CT scan of brain without contrast that revealed multiple hemorrhagic mass lesions within the brain. She was further worked up with an MRI of brain that revealed a 2.5 x 3.6 x 2.2 cm hemorrhagic metastatic disease at right cerebellar hemisphere with additional too hemorrhagic metastatic lesions at right parietal and occipital lobes. CT scan of lungs with IV contrast showed central left lower lobe 3.8 cm lung mass having appearance of primary bronchogenic carcinoma. She was started on steroid therapy. Case was discussed with pulmonary critical care medicine for bronchoscopy. Radiation oncology has also been consulted.  Objective: Filed Vitals:   11/25/15 0520 11/25/15 1312  BP: 122/59 131/63  Pulse: 72 70  Temp: 98.5 F (36.9 C) 97.4 F (36.3 C)  Resp: 16 18    Intake/Output Summary (Last 24 hours) at 11/25/15 1630 Last data filed at 11/25/15 1300  Gross per 24 hour  Intake   1764 ml  Output   3550 ml  Net  -1786 ml   Filed Weights   11/23/15 1920  Weight: 74.8 kg (164 lb 14.5 oz)    Exam:   General:  Patient continues to have slurred speech although is awake alert no acute distress  Cardiovascular: regular rate and rhythm normal S1-S2 no murmurs rubs or gallops  Respiratory: few scattered respiratory wheezes, otherwise no crackles or rales, normal respiratory effort  Abdomen: soft nontender nondistended  Musculoskeletal: no edema  Data Reviewed: Basic Metabolic Panel:  Recent Labs Lab 11/23/15 1235 11/23/15 1645 11/23/15 2303 11/25/15 0725  NA 140 140  --  139  K 3.1* 3.5  --  4.7  CL 104 103  --  105  CO2 27 26  --  23  GLUCOSE 257* 242*  --  323*  BUN 10 6  --  15  CREATININE 0.74 0.65  --  0.75  CALCIUM 9.7 9.9  --  10.0  MG  --   --  1.7  --    Liver Function Tests:  Recent Labs Lab 11/23/15 1235  11/23/15 1645  AST 13* 15  ALT 13* 14  ALKPHOS 69 66  BILITOT 0.7 0.7  PROT 7.8 7.7  ALBUMIN 3.7 3.0*   No results for input(s): LIPASE, AMYLASE in the last 168 hours. No results for input(s): AMMONIA in the last 168 hours. CBC:  Recent Labs Lab 11/23/15 1235 11/23/15 1645 11/25/15 0725  WBC 8.0 8.2 11.8*  NEUTROABS 5.4 5.4  --   HGB 12.2 12.0 11.4*  HCT 36.5 36.1 36.3  MCV 89.3 89.8 92.6  PLT 360 369 410*   Cardiac Enzymes: No results for input(s): CKTOTAL, CKMB, CKMBINDEX, TROPONINI in the last 168 hours. BNP (last 3 results) No results for input(s): BNP in the last 8760 hours.  ProBNP (last 3 results) No results for input(s): PROBNP in the last 8760 hours.  CBG:  Recent Labs Lab 11/24/15 1143 11/24/15 1628 11/24/15 2149 11/25/15 0739 11/25/15 1147  GLUCAP 291* 310* 277* 285* 252*    Recent Results (from the past 240 hour(s))  Blood culture (routine x 2)     Status: None (Preliminary result)   Collection Time: 11/23/15  2:12 PM  Result Value Ref Range Status   Specimen Description BLOOD RIGHT ASSIST CONTROL  Final   Special Requests BOTTLES DRAWN AEROBIC AND ANAEROBIC 2CCAERO,4CCANA  Final   Culture NO GROWTH 2 DAYS  Final   Report Status PENDING  Incomplete  Blood culture (routine x 2)     Status: None (Preliminary result)   Collection Time: 11/23/15  2:12 PM  Result Value Ref Range Status   Specimen Description BLOOD LEFT ASSIST CONTROL  Final   Special Requests BOTTLES DRAWN AEROBIC AND ANAEROBIC 2CCANA,4CCAERO  Final   Culture NO GROWTH 2 DAYS  Final   Report Status PENDING  Incomplete     Studies: Ct Chest W Contrast  11/24/2015  CLINICAL DATA:  Inpatient with brain metastases of uncertain primary EXAM: CT CHEST, ABDOMEN, AND PELVIS WITH CONTRAST TECHNIQUE: Multidetector CT imaging of the chest, abdomen and pelvis was performed following the standard protocol during bolus administration of intravenous contrast. CONTRAST:  131m OMNIPAQUE IOHEXOL  300 MG/ML  SOLN COMPARISON:  Chest radiograph from one day prior. FINDINGS: CT CHEST Mediastinum/Nodes: Normal heart size. Trace pericardial fluid/thickening. Atherosclerotic nonaneurysmal thoracic aorta. Normal caliber pulmonary arteries. No central pulmonary emboli. Normal visualized thyroid. Normal esophagus. No axillary lymphadenopathy. There is a mildly enlarged 1.0 cm right supraclavicular lymph node (series 201/ image 7). There are bulky confluent enlarged right paratracheal lymph nodes, largest 3.1 cm (series 201/image 18). There is a bulky 3.5 cm subcarinal node (series 201/image 27). There is an enlarged 1.3 cm prevascular lymph node in the upper left mediastinum (series 201/image 13). There is confluent right hilar lymphadenopathy, with a dominant 3.0 cm right hilar node (series 201/image 25). Lungs/Pleura: No pneumothorax. No pleural effusion. There is an approximately 3.8 x 2.5 cm lung mass in the central left lower lobe (series 205/ image 27), which occludes the left lower lobe bronchus, with associated complete left lower lobe atelectasis. No acute consolidative airspace disease, significant pulmonary nodules or lung masses in the remaining lung lobes. Musculoskeletal:  No  aggressive appearing focal osseous lesions. CT ABDOMEN AND PELVIS Hepatobiliary: Normal liver with no liver mass. Normal gallbladder with no radiopaque cholelithiasis. No biliary ductal dilatation. Pancreas: Normal, with no mass or duct dilation. Spleen: Normal size. No mass. Adrenals/Urinary Tract: Normal right adrenal. A 1.6 cm left adrenal mass demonstrates indeterminate density of 93 HU. There is a 3.4 x 3.0 cm solid mass in the upper left kidney (series 301/image 80), which demonstrates slight hypoenhancement relative to the renal parenchyma. No hydronephrosis. Normal bladder. Stomach/Bowel: Grossly normal stomach. Normal caliber small bowel with no small bowel wall thickening. Normal appendix. Normal large bowel with no  diverticulosis, large bowel wall thickening or pericolonic fat stranding. Vascular/Lymphatic: Atherosclerotic nonaneurysmal abdominal aorta. Patent portal, splenic, hepatic and renal veins. There is a 1.2 x 1.1 cm mass in the left lower quadrant mesenteric (series 201/ image 96). Otherwise no lymphadenopathy in the abdomen or pelvis. Reproductive: Status post hysterectomy, with no abnormal findings at the vaginal cuff. No adnexal mass. Other: No pneumoperitoneum, ascites or focal fluid collection. Musculoskeletal: No aggressive appearing focal osseous lesions. IMPRESSION: 1. Central left lower lobe 3.8 cm lung mass, most in keeping with a primary bronchogenic carcinoma, which occludes the left lower lobe bronchus, with associated complete left lower lobe atelectasis. 2. Bulky ipsilateral, subcarinal and contralateral mediastinal lymphadenopathy. Bulky contralateral hilar lymphadenopathy. Contralateral supraclavicular lymphadenopathy. 3. Trace pericardial fluid/thickening.  No pleural effusion. 4. Solid 3.4 cm renal mass in the upper left kidney, favor a renal metastasis. 5. Indeterminate left adrenal 1.6 cm mass, probably an adrenal metastasis. 6. Left lower quadrant 1.2 cm mesenteric mass, favor a hematogenous metastasis. Electronically Signed   By: Ilona Sorrel M.D.   On: 11/24/2015 08:16   Mr Jeri Cos ZD Contrast  11/23/2015  CLINICAL DATA:  Initial evaluation for intracranial metastasis. EXAM: MRI HEAD WITHOUT AND WITH CONTRAST TECHNIQUE: Multiplanar, multiecho pulse sequences of the brain and surrounding structures were obtained without and with intravenous contrast. CONTRAST:  48m MULTIHANCE GADOBENATE DIMEGLUMINE 529 MG/ML IV SOLN COMPARISON:  Prior CT from earlier the same day. FINDINGS: Age appropriate cerebral atrophy present. Patchy T2/FLAIR hyperintensity within the periventricular, deep, and subcortical white matter both cerebral hemispheres present, most like related to mild chronic small vessel  ischemic disease. No acute infarct. Major intracranial vascular flow voids maintained. Previously identified hemorrhagic mass centered at the right cerebellar hemisphere again seen, measuring 2.5 x 3.6 x 2.2 cm (AP by transverse by craniocaudad). Internal fluid fluid level present within this lesion. There is mild localized vasogenic edema with partial effacement of the fourth ventricle which remains patent. No hydrocephalus. Cerebellar tonsils are somewhat low lying at the upper limits of normal measuring 5-6 mm below the foramen magnum. Lesion abuts the right tentorium superiorly. There is focal enhancement along the tentorium itself (series 12, image 6). Second cortically based lesion within the right occipital lobe measures 1.4 x 1.5 x 1.3 cm (AP by transverse by craniocaudad). This lesion is also hemorrhagic in nature with internal fluid fluid level. Mild surrounding vasogenic edema without significant mass effect. Third lesion centered within the parasagittal high right parietal lobe measures 17 x 16 x 18 mm (AP by transverse by craniocaudad). This lesion is somewhat cystic in nature with minimal internal blood products. Mild localized vasogenic edema without significant mass effect. Possible additional tiny 6 mm lesion within the right occipital lobe without significant mass effect (series 11, image 13). Faint brush like enhancement measuring approximately 6 mm present within the parasagittal right frontal lobe (series  11, image 19). Indeterminate. Pituitary gland normal. No acute abnormality about the orbits. Sequela prior bilateral lens extraction noted. Paranasal sinuses are clear. No mastoid effusion. Inner ear structures normal. Bone marrow signal intensity within normal limits. IMPRESSION: 1. 2.5 x 3.6 x 2.2 cm hemorrhagic metastasis centered at the right cerebellar hemisphere. There is mild localized vasogenic edema with mild mass effect on the adjacent fourth ventricle without hydrocephalus. 2.  Additional 2 hemorrhagic metastatic lesions within the right parietal and occipital lobes as above. Mild localized edema without significant mass effect. 3. Additional possible tiny 6 mm metastasis within the right occipital lobe as above. Attention at follow-up. 4. Faint brush like enhancement within the parasagittal right frontal lobe as above, indeterminate. Attention at follow-up. 5. Cerebellar tonsils positioned at the upper limits of normal 5-6 mm below the foramen magnum. This is felt to be likely incidental and not due to edema from the right cerebellar metastasis. Electronically Signed   By: Jeannine Boga M.D.   On: 11/23/2015 22:00   Ct Abdomen Pelvis W Contrast  11/24/2015  CLINICAL DATA:  Inpatient with brain metastases of uncertain primary EXAM: CT CHEST, ABDOMEN, AND PELVIS WITH CONTRAST TECHNIQUE: Multidetector CT imaging of the chest, abdomen and pelvis was performed following the standard protocol during bolus administration of intravenous contrast. CONTRAST:  164m OMNIPAQUE IOHEXOL 300 MG/ML  SOLN COMPARISON:  Chest radiograph from one day prior. FINDINGS: CT CHEST Mediastinum/Nodes: Normal heart size. Trace pericardial fluid/thickening. Atherosclerotic nonaneurysmal thoracic aorta. Normal caliber pulmonary arteries. No central pulmonary emboli. Normal visualized thyroid. Normal esophagus. No axillary lymphadenopathy. There is a mildly enlarged 1.0 cm right supraclavicular lymph node (series 201/ image 7). There are bulky confluent enlarged right paratracheal lymph nodes, largest 3.1 cm (series 201/image 18). There is a bulky 3.5 cm subcarinal node (series 201/image 27). There is an enlarged 1.3 cm prevascular lymph node in the upper left mediastinum (series 201/image 13). There is confluent right hilar lymphadenopathy, with a dominant 3.0 cm right hilar node (series 201/image 25). Lungs/Pleura: No pneumothorax. No pleural effusion. There is an approximately 3.8 x 2.5 cm lung mass in the  central left lower lobe (series 205/ image 27), which occludes the left lower lobe bronchus, with associated complete left lower lobe atelectasis. No acute consolidative airspace disease, significant pulmonary nodules or lung masses in the remaining lung lobes. Musculoskeletal:  No aggressive appearing focal osseous lesions. CT ABDOMEN AND PELVIS Hepatobiliary: Normal liver with no liver mass. Normal gallbladder with no radiopaque cholelithiasis. No biliary ductal dilatation. Pancreas: Normal, with no mass or duct dilation. Spleen: Normal size. No mass. Adrenals/Urinary Tract: Normal right adrenal. A 1.6 cm left adrenal mass demonstrates indeterminate density of 93 HU. There is a 3.4 x 3.0 cm solid mass in the upper left kidney (series 301/image 80), which demonstrates slight hypoenhancement relative to the renal parenchyma. No hydronephrosis. Normal bladder. Stomach/Bowel: Grossly normal stomach. Normal caliber small bowel with no small bowel wall thickening. Normal appendix. Normal large bowel with no diverticulosis, large bowel wall thickening or pericolonic fat stranding. Vascular/Lymphatic: Atherosclerotic nonaneurysmal abdominal aorta. Patent portal, splenic, hepatic and renal veins. There is a 1.2 x 1.1 cm mass in the left lower quadrant mesenteric (series 201/ image 96). Otherwise no lymphadenopathy in the abdomen or pelvis. Reproductive: Status post hysterectomy, with no abnormal findings at the vaginal cuff. No adnexal mass. Other: No pneumoperitoneum, ascites or focal fluid collection. Musculoskeletal: No aggressive appearing focal osseous lesions. IMPRESSION: 1. Central left lower lobe 3.8 cm lung mass,  most in keeping with a primary bronchogenic carcinoma, which occludes the left lower lobe bronchus, with associated complete left lower lobe atelectasis. 2. Bulky ipsilateral, subcarinal and contralateral mediastinal lymphadenopathy. Bulky contralateral hilar lymphadenopathy. Contralateral supraclavicular  lymphadenopathy. 3. Trace pericardial fluid/thickening.  No pleural effusion. 4. Solid 3.4 cm renal mass in the upper left kidney, favor a renal metastasis. 5. Indeterminate left adrenal 1.6 cm mass, probably an adrenal metastasis. 6. Left lower quadrant 1.2 cm mesenteric mass, favor a hematogenous metastasis. Electronically Signed   By: Ilona Sorrel M.D.   On: 11/24/2015 08:16    Scheduled Meds: . dexamethasone  4 mg Oral 4 times per day  . fluconazole  100 mg Oral Daily  . insulin aspart  0-9 Units Subcutaneous TID WC  . insulin glargine  10 Units Subcutaneous Daily  . latanoprost  1 drop Both Eyes QHS  . levETIRAcetam  500 mg Oral BID  . lidocaine      . omega-3 acid ethyl esters  2 g Oral BID  . pantoprazole  40 mg Oral Daily   Continuous Infusions: . 0.9 % NaCl with KCl 20 mEq / L 75 mL/hr at 11/25/15 1543    Active Problems:   Metastatic cancer ([place])   Metastatic cancer to brain of unknown cell type ([facility])   COPD (chronic obstructive pulmonary disease) ([place])   Dysarthria   Hypokalemia   Mediastinal adenopathy   Supraclavicular adenopathy    Time spent: 25 minutes   Kelvin Cellar  [facility] Pager [phone]. If 7PM-7AM, please contact night-coverage at [email or web address], password [facility] 11/25/2015, 4:30 PM  LOS: 2 days

## 2015-11-25 NOTE — Progress Notes (Signed)
Bx site clean, dry, and intact. No signs of bleeding or complications.

## 2015-11-25 NOTE — Consult Note (Cosign Needed)
Radiation Oncology         [phone]) [phone] ________________________________  Name: Cynthia Carson MRN: [id number]  Date: 11/23/2015  DOB: 04-Jun-1951  PQ:ZRAQT,  Mikeal Hawthorne, MD  No ref. provider found     REFERRING PHYSICIAN: No ref. provider found   DIAGNOSIS: The primary encounter diagnosis was Metastatic cancer ([place]). 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   HISTORY OF PRESENT ILLNESS::Cynthia Carson is a 65 y.o. female who is seen for an initial consultation visit regarding the patient's diagnosis of metastic brian metastasis, likely metastatic lung cancer based on initial imaging.  The patient presented with difficulty with word finding/ slurred speech for ~ 5 days. Initial imaging indicated likely brain metastases on CT scan. Subsequent brain MRI scan shows a dominant 3.6 cm right cerebellar tumor in addition to several smaller lesions. CT scan chest/abdomen/pelvis shows a left lower lobe tumor with associated atelectasis as well as significant lymphadenopathy. A likely left renal metastasis was also present. The patient denies any pain, no significant SOB.  The patient is being scheduled for a biopsy, likely lung cancer possible small cell lung cancer.    PREVIOUS RADIATION THERAPY: No   PAST MEDICAL HISTORY:  has a past medical history of Diabetes mellitus without complication ([place]).     PAST SURGICAL HISTORY: Past Surgical History  Procedure Laterality Date  . Abdominal hysterectomy       FAMILY HISTORY: family history is not on file.   SOCIAL HISTORY:  reports that she quit smoking 6 days ago. She does not have any smokeless tobacco history on file. She reports that she does not drink alcohol or use illicit drugs.   ALLERGIES: Amoxicillin and Levofloxacin   MEDICATIONS:  Current Facility-Administered Medications  Medication Dose Route Frequency Provider Last Rate Last Dose  .  0.9 % NaCl with KCl 20 mEq/ L  infusion   Intravenous Continuous Orson Eva, MD 75 mL/hr at 11/25/15 0224    . acetaminophen (TYLENOL) tablet 650 mg  650 mg Oral Q6H PRN Orson Eva, MD      . ALPRAZolam Duanne Moron) tablet 0.5 mg  0.5 mg Oral TID PRN Orson Eva, MD   0.5 mg at 11/24/15 2202  . dexamethasone (DECADRON) tablet 4 mg  4 mg Oral 4 times per day Ashok Pall, MD   4 mg at 11/25/15 0616  . fluconazole (DIFLUCAN) tablet 100 mg  100 mg Oral Daily Kelvin Cellar, MD   100 mg at 11/24/15 1155  . HYDROcodone-acetaminophen (NORCO/VICODIN) 5-325 MG per tablet 1-2 tablet  1-2 tablet Oral Q4H PRN Orson Eva, MD      . insulin aspart (novoLOG) injection 0-9 Units  0-9 Units Subcutaneous TID WC Orson Eva, MD   5 Units at 11/25/15 0830  . insulin glargine (LANTUS) injection 10 Units  10 Units Subcutaneous Daily Kelvin Cellar, MD   10 Units at 11/24/15 1639  . latanoprost (XALATAN) 0.005 % ophthalmic solution 1 drop  1 drop Both Eyes QHS Orson Eva, MD   1 drop at 11/24/15 2202  . levETIRAcetam (KEPPRA) tablet 500 mg  500 mg Oral BID Ashok Pall, MD   500 mg at 11/24/15 2202  . omega-3 acid ethyl esters (LOVAZA) capsule 2 g  2 g Oral BID Orson Eva, MD   2 g at 11/24/15 2201  . ondansetron (ZOFRAN) tablet 4 mg  4 mg Oral Q6H PRN Orson Eva, MD  Or  . ondansetron (ZOFRAN) injection 4 mg  4 mg Intravenous Q6H PRN Orson Eva, MD      . pantoprazole (PROTONIX) EC tablet 40 mg  40 mg Oral Daily Orson Eva, MD   40 mg at 11/24/15 5638     REVIEW OF SYSTEMS:  A 15 point review of systems is documented in the electronic medical record. This was obtained by the nursing staff. However, I reviewed this with the patient to discuss relevant findings and make appropriate changes.  Pertinent items are noted in HPI.    PHYSICAL EXAM:  height is '5\' 5"'$  (1.651 m) and weight is 164 lb 14.5 oz (74.8 kg). Her oral temperature is 98.5 F (36.9 C). Her blood pressure is 122/59 and her pulse is 72. Her respiration is 16 and  oxygen saturation is 95%.   ECOG = 1  0 - Asymptomatic (Fully active, able to carry on all predisease activities without restriction)  1 - Symptomatic but completely ambulatory (Restricted in physically strenuous activity but ambulatory and able to carry out work of a light or sedentary nature. For example, light housework, office work)  2 - Symptomatic, <50% in bed during the day (Ambulatory and capable of all self care but unable to carry out any work activities. Up and about more than 50% of waking hours)  3 - Symptomatic, >50% in bed, but not bedbound (Capable of only limited self-care, confined to bed or chair 50% or more of waking hours)  4 - Bedbound (Completely disabled. Cannot carry on any self-care. Totally confined to bed or chair)  5 - Death   Eustace Pen MM, Creech RH, Tormey DC, et al. [phone]). "Toxicity and response criteria of the [facility]". [place] Oncol. 5 (6): 649-55  Alert, no acute distress; some difficulty with word finding.   LABORATORY DATA:  Lab Results  Component Value Date   WBC 11.8* 11/25/2015   HGB 11.4* 11/25/2015   HCT 36.3 11/25/2015   MCV 92.6 11/25/2015   PLT 410* 11/25/2015   Lab Results  Component Value Date   NA 140 11/23/2015   K 3.5 11/23/2015   CL 103 11/23/2015   CO2 26 11/23/2015   Lab Results  Component Value Date   ALT 14 11/23/2015   AST 15 11/23/2015   ALKPHOS 66 11/23/2015   BILITOT 0.7 11/23/2015      RADIOGRAPHY: Dg Chest 2 View  11/23/2015  CLINICAL DATA:  Shortness of Breath EXAM: CHEST  2 VIEW COMPARISON:  August 02, 2009 FINDINGS: There is airspace consolidation in the medial segment of the left lower lobe with volume loss. The lungs elsewhere clear. Heart size and pulmonary vascularity are normal. There is slight prominence in the azygos region compared to prior study. No other evidence suggesting potential adenopathy. No bone lesions. IMPRESSION: Airspace consolidation with volume loss  medial segment left lower lobe. Suspect pneumonia, although an endobronchial lesion causing obstruction of the medial segment left lower lobe bronchus is a possibility. Lungs elsewhere clear. There is prominence in the azygos region compared to the prior study. The possibility of localized lymph node enlargement must be of concern given this appearance unchanged from prior study. Given the findings above, contrast enhanced chest CT would be advisable to further assess. Electronically Signed   By: Lowella Grip III M.D.   On: 11/23/2015 13:51   Ct Head Wo Contrast  11/23/2015  CLINICAL DATA:  Slurred speech since 11/15/2015. EXAM: CT HEAD WITHOUT CONTRAST TECHNIQUE: Contiguous  axial images were obtained from the base of the skull through the vertex without intravenous contrast. COMPARISON:  None. FINDINGS: The patient has multiple hemorrhagic mass lesions in the brain including a 3.5 cm hemorrhagic mass in the right cerebellar hemisphere which has a slight mass effect upon the fourth ventricle. There is a 16 mm hemorrhagic metastasis in the posterior aspect of the right occipital lobe and there is a 15 mm hemorrhagic metastasis high in the right parietal lobe. There are several small focal areas of lucency in the periventricular white matter of frontal lobes. There is no midline shift. No acute osseous abnormality. IMPRESSION: Multiple hemorrhagic brain lesions. This most likely represents metastatic disease. Melanoma, renal cell carcinoma, choriocarcinoma, thyroid carcinoma and lung cancer, breast cancer, and hepatocellular carcinoma are the most common hemorrhagic metastases to the brain. Electronically Signed   By: Lorriane Shire M.D.   On: 11/23/2015 12:53   Ct Chest W Contrast  11/24/2015  CLINICAL DATA:  Inpatient with brain metastases of uncertain primary EXAM: CT CHEST, ABDOMEN, AND PELVIS WITH CONTRAST TECHNIQUE: Multidetector CT imaging of the chest, abdomen and pelvis was performed following the  standard protocol during bolus administration of intravenous contrast. CONTRAST:  148m OMNIPAQUE IOHEXOL 300 MG/ML  SOLN COMPARISON:  Chest radiograph from one day prior. FINDINGS: CT CHEST Mediastinum/Nodes: Normal heart size. Trace pericardial fluid/thickening. Atherosclerotic nonaneurysmal thoracic aorta. Normal caliber pulmonary arteries. No central pulmonary emboli. Normal visualized thyroid. Normal esophagus. No axillary lymphadenopathy. There is a mildly enlarged 1.0 cm right supraclavicular lymph node (series 201/ image 7). There are bulky confluent enlarged right paratracheal lymph nodes, largest 3.1 cm (series 201/image 18). There is a bulky 3.5 cm subcarinal node (series 201/image 27). There is an enlarged 1.3 cm prevascular lymph node in the upper left mediastinum (series 201/image 13). There is confluent right hilar lymphadenopathy, with a dominant 3.0 cm right hilar node (series 201/image 25). Lungs/Pleura: No pneumothorax. No pleural effusion. There is an approximately 3.8 x 2.5 cm lung mass in the central left lower lobe (series 205/ image 27), which occludes the left lower lobe bronchus, with associated complete left lower lobe atelectasis. No acute consolidative airspace disease, significant pulmonary nodules or lung masses in the remaining lung lobes. Musculoskeletal:  No aggressive appearing focal osseous lesions. CT ABDOMEN AND PELVIS Hepatobiliary: Normal liver with no liver mass. Normal gallbladder with no radiopaque cholelithiasis. No biliary ductal dilatation. Pancreas: Normal, with no mass or duct dilation. Spleen: Normal size. No mass. Adrenals/Urinary Tract: Normal right adrenal. A 1.6 cm left adrenal mass demonstrates indeterminate density of 93 HU. There is a 3.4 x 3.0 cm solid mass in the upper left kidney (series 301/image 80), which demonstrates slight hypoenhancement relative to the renal parenchyma. No hydronephrosis. Normal bladder. Stomach/Bowel: Grossly normal stomach. Normal  caliber small bowel with no small bowel wall thickening. Normal appendix. Normal large bowel with no diverticulosis, large bowel wall thickening or pericolonic fat stranding. Vascular/Lymphatic: Atherosclerotic nonaneurysmal abdominal aorta. Patent portal, splenic, hepatic and renal veins. There is a 1.2 x 1.1 cm mass in the left lower quadrant mesenteric (series 201/ image 96). Otherwise no lymphadenopathy in the abdomen or pelvis. Reproductive: Status post hysterectomy, with no abnormal findings at the vaginal cuff. No adnexal mass. Other: No pneumoperitoneum, ascites or focal fluid collection. Musculoskeletal: No aggressive appearing focal osseous lesions. IMPRESSION: 1. Central left lower lobe 3.8 cm lung mass, most in keeping with a primary bronchogenic carcinoma, which occludes the left lower lobe bronchus, with associated complete left  lower lobe atelectasis. 2. Bulky ipsilateral, subcarinal and contralateral mediastinal lymphadenopathy. Bulky contralateral hilar lymphadenopathy. Contralateral supraclavicular lymphadenopathy. 3. Trace pericardial fluid/thickening.  No pleural effusion. 4. Solid 3.4 cm renal mass in the upper left kidney, favor a renal metastasis. 5. Indeterminate left adrenal 1.6 cm mass, probably an adrenal metastasis. 6. Left lower quadrant 1.2 cm mesenteric mass, favor a hematogenous metastasis. Electronically Signed   By: Ilona Sorrel M.D.   On: 11/24/2015 08:16   Mr Jeri Cos TI Contrast  11/23/2015  CLINICAL DATA:  Initial evaluation for intracranial metastasis. EXAM: MRI HEAD WITHOUT AND WITH CONTRAST TECHNIQUE: Multiplanar, multiecho pulse sequences of the brain and surrounding structures were obtained without and with intravenous contrast. CONTRAST:  52m MULTIHANCE GADOBENATE DIMEGLUMINE 529 MG/ML IV SOLN COMPARISON:  Prior CT from earlier the same day. FINDINGS: Age appropriate cerebral atrophy present. Patchy T2/FLAIR hyperintensity within the periventricular, deep, and  subcortical white matter both cerebral hemispheres present, most like related to mild chronic small vessel ischemic disease. No acute infarct. Major intracranial vascular flow voids maintained. Previously identified hemorrhagic mass centered at the right cerebellar hemisphere again seen, measuring 2.5 x 3.6 x 2.2 cm (AP by transverse by craniocaudad). Internal fluid fluid level present within this lesion. There is mild localized vasogenic edema with partial effacement of the fourth ventricle which remains patent. No hydrocephalus. Cerebellar tonsils are somewhat low lying at the upper limits of normal measuring 5-6 mm below the foramen magnum. Lesion abuts the right tentorium superiorly. There is focal enhancement along the tentorium itself (series 12, image 6). Second cortically based lesion within the right occipital lobe measures 1.4 x 1.5 x 1.3 cm (AP by transverse by craniocaudad). This lesion is also hemorrhagic in nature with internal fluid fluid level. Mild surrounding vasogenic edema without significant mass effect. Third lesion centered within the parasagittal high right parietal lobe measures 17 x 16 x 18 mm (AP by transverse by craniocaudad). This lesion is somewhat cystic in nature with minimal internal blood products. Mild localized vasogenic edema without significant mass effect. Possible additional tiny 6 mm lesion within the right occipital lobe without significant mass effect (series 11, image 13). Faint brush like enhancement measuring approximately 6 mm present within the parasagittal right frontal lobe (series 11, image 19). Indeterminate. Pituitary gland normal. No acute abnormality about the orbits. Sequela prior bilateral lens extraction noted. Paranasal sinuses are clear. No mastoid effusion. Inner ear structures normal. Bone marrow signal intensity within normal limits. IMPRESSION: 1. 2.5 x 3.6 x 2.2 cm hemorrhagic metastasis centered at the right cerebellar hemisphere. There is mild  localized vasogenic edema with mild mass effect on the adjacent fourth ventricle without hydrocephalus. 2. Additional 2 hemorrhagic metastatic lesions within the right parietal and occipital lobes as above. Mild localized edema without significant mass effect. 3. Additional possible tiny 6 mm metastasis within the right occipital lobe as above. Attention at follow-up. 4. Faint brush like enhancement within the parasagittal right frontal lobe as above, indeterminate. Attention at follow-up. 5. Cerebellar tonsils positioned at the upper limits of normal 5-6 mm below the foramen magnum. This is felt to be likely incidental and not due to edema from the right cerebellar metastasis. Electronically Signed   By: BJeannine BogaM.D.   On: 11/23/2015 22:00   Ct Abdomen Pelvis W Contrast  11/24/2015  CLINICAL DATA:  Inpatient with brain metastases of uncertain primary EXAM: CT CHEST, ABDOMEN, AND PELVIS WITH CONTRAST TECHNIQUE: Multidetector CT imaging of the chest, abdomen and pelvis was performed  following the standard protocol during bolus administration of intravenous contrast. CONTRAST:  144m OMNIPAQUE IOHEXOL 300 MG/ML  SOLN COMPARISON:  Chest radiograph from one day prior. FINDINGS: CT CHEST Mediastinum/Nodes: Normal heart size. Trace pericardial fluid/thickening. Atherosclerotic nonaneurysmal thoracic aorta. Normal caliber pulmonary arteries. No central pulmonary emboli. Normal visualized thyroid. Normal esophagus. No axillary lymphadenopathy. There is a mildly enlarged 1.0 cm right supraclavicular lymph node (series 201/ image 7). There are bulky confluent enlarged right paratracheal lymph nodes, largest 3.1 cm (series 201/image 18). There is a bulky 3.5 cm subcarinal node (series 201/image 27). There is an enlarged 1.3 cm prevascular lymph node in the upper left mediastinum (series 201/image 13). There is confluent right hilar lymphadenopathy, with a dominant 3.0 cm right hilar node (series 201/image 25).  Lungs/Pleura: No pneumothorax. No pleural effusion. There is an approximately 3.8 x 2.5 cm lung mass in the central left lower lobe (series 205/ image 27), which occludes the left lower lobe bronchus, with associated complete left lower lobe atelectasis. No acute consolidative airspace disease, significant pulmonary nodules or lung masses in the remaining lung lobes. Musculoskeletal:  No aggressive appearing focal osseous lesions. CT ABDOMEN AND PELVIS Hepatobiliary: Normal liver with no liver mass. Normal gallbladder with no radiopaque cholelithiasis. No biliary ductal dilatation. Pancreas: Normal, with no mass or duct dilation. Spleen: Normal size. No mass. Adrenals/Urinary Tract: Normal right adrenal. A 1.6 cm left adrenal mass demonstrates indeterminate density of 93 HU. There is a 3.4 x 3.0 cm solid mass in the upper left kidney (series 301/image 80), which demonstrates slight hypoenhancement relative to the renal parenchyma. No hydronephrosis. Normal bladder. Stomach/Bowel: Grossly normal stomach. Normal caliber small bowel with no small bowel wall thickening. Normal appendix. Normal large bowel with no diverticulosis, large bowel wall thickening or pericolonic fat stranding. Vascular/Lymphatic: Atherosclerotic nonaneurysmal abdominal aorta. Patent portal, splenic, hepatic and renal veins. There is a 1.2 x 1.1 cm mass in the left lower quadrant mesenteric (series 201/ image 96). Otherwise no lymphadenopathy in the abdomen or pelvis. Reproductive: Status post hysterectomy, with no abnormal findings at the vaginal cuff. No adnexal mass. Other: No pneumoperitoneum, ascites or focal fluid collection. Musculoskeletal: No aggressive appearing focal osseous lesions. IMPRESSION: 1. Central left lower lobe 3.8 cm lung mass, most in keeping with a primary bronchogenic carcinoma, which occludes the left lower lobe bronchus, with associated complete left lower lobe atelectasis. 2. Bulky ipsilateral, subcarinal and  contralateral mediastinal lymphadenopathy. Bulky contralateral hilar lymphadenopathy. Contralateral supraclavicular lymphadenopathy. 3. Trace pericardial fluid/thickening.  No pleural effusion. 4. Solid 3.4 cm renal mass in the upper left kidney, favor a renal metastasis. 5. Indeterminate left adrenal 1.6 cm mass, probably an adrenal metastasis. 6. Left lower quadrant 1.2 cm mesenteric mass, favor a hematogenous metastasis. Electronically Signed   By: JIlona SorrelM.D.   On: 11/24/2015 08:16       IMPRESSION:  The patient has apparent brain metastases, likely metastatic lung cancer with biopsy pending. Has begun decadron. Was seen by Dr. CCyndy Freezein neurosurgery who is awiting further results.   PLAN:  The patient will likely benefit from cranial radiation after pathology result. If small cell lung cancer, then the patient would likely benefit from whole brain radiation treatment. Otherwise, would need to further discuss surgical recommendations with neurosurgery, especially regarding dominant right cerebellar tumor. Patient would be a potential candidate for radiosurgery as well. The patient may also benefit from palliative thoracic radiotherapy.  [facility] follow patient. [facility] Depending on final recommendations, we  can coordinate with other radiation facilities as needed.   ________________________________   Jodelle Gross, MD, PhD   **Disclaimer: This note was dictated with voice recognition software. Similar sounding words can inadvertently be transcribed and this note may contain transcription errors which may not have been corrected upon publication of note.**

## 2015-11-25 NOTE — Progress Notes (Signed)
Patient brought back from procedure. Bx site clean, dry, intact. No bleeding noted.

## 2015-11-26 LAB — GLUCOSE, CAPILLARY
Glucose-Capillary: 263 mg/dL — ABNORMAL HIGH (ref 65–99)
Glucose-Capillary: 280 mg/dL — ABNORMAL HIGH (ref 65–99)

## 2015-11-26 MED ORDER — LEVETIRACETAM 500 MG PO TABS: 500.0000 mg | ORAL_TABLET | Freq: Two times a day (BID) | ORAL | Status: DC

## 2015-11-26 MED ORDER — DEXAMETHASONE 2 MG PO TABS: ORAL_TABLET | ORAL | Status: DC

## 2015-11-26 NOTE — Progress Notes (Signed)
Subjective: Patient reports She's feeling fine with no headache no nausea just some dysarthria  Objective: Vital signs in last 24 hours: Temp:  [97.4 F (36.3 C)-97.9 F (36.6 C)] 97.6 F (36.4 C) (01/07 0532) Pulse Rate:  [61-88] 61 (01/07 0532) Resp:  [18] 18 (01/07 0532) BP: (129-146)/(59-77) 135/71 mmHg (01/07 0532) SpO2:  [95 %-98 %] 95 % (01/07 0532)  Intake/Output from previous day: 01/06 0701 - 01/07 0700 In: 0  Out: 2250 [Urine:2250] Intake/Output this shift:    Awake alert oriented 4 dysarthric but otherwise full strength some right-sided dysmetria but no ataxia  Lab Results:  Recent Labs  11/23/15 1645 11/25/15 0725  WBC 8.2 11.8*  HGB 12.0 11.4*  HCT 36.1 36.3  PLT 369 410*   BMET  Recent Labs  11/23/15 1645 11/25/15 0725  NA 140 139  K 3.5 4.7  CL 103 105  CO2 26 23  GLUCOSE 242* 323*  BUN 6 15  CREATININE 0.65 0.75  CALCIUM 9.9 10.0    Studies/Results: Ir US Guide Bx Asp/drain  11/25/2015  CLINICAL DATA:  65 year old female with new diagnosis concerning for primary lung cancer with extensive mediastinal adenopathy and a possible metastatic right supraclavicular lymph node. EXAM: IR ULTRASOUND GUIDANCE Date: 11/25/2015 PROCEDURE: 1. Ultrasound-guided biopsy small right supraclavicular lymph node Interventional Radiologist:  Criselda Peaches, MD ANESTHESIA/SEDATION: None MEDICATIONS: None TECHNIQUE: Informed consent was obtained from the patient following explanation of the procedure, risks, benefits and alternatives. The patient understands, agrees and consents for the procedure. All questions were addressed. A time out was performed. The right supraclavicular fossa was sterilely prepped and draped in the standard fashion using chlorhexidine skin prep. The region was then interrogated with ultrasound. There is a small 9 x 7 mm rounded hypoechoic lymph node adjacent to the internal jugular vein which corresponds with the abnormality on the recent CT  scan of the chest. Local anesthesia was then attained by infiltration with 1% lidocaine. A small dermatotomy was made. Under real-time sonographic guidance, several 18 gauge core biopsies were obtained. Ultrasound was used to confirm placement of the biopsy needle within the lymph node on all passes. Lymph node samples were divided between formalin and saline and sent to pathology for further analysis. The patient tolerated the procedure well. COMPLICATIONS: None IMPRESSION: Successful ultrasound-guided core biopsy of right supraclavicular lymph node. Signed, Criselda Peaches, MD [facility] Radiology Electronically Signed   By: Jacqulynn Cadet M.D.   On: 11/25/2015 17:02    Assessment/Plan: 65 year old with metastatic cancer to the brain with a moderate sized right cerebellar lesion and multiple other supratentorial lesions. Patient clinical seems be stable biopsy was yesterday results are still pending. Patient states she really wants to go home today she feels like she can get home safely certainly it would be less, K to keep her in the hospital for evaluation by Dr. Cyndy Freeze to see if he is going to plan surgical resection of the cerebellar lesion however I do not think that this is a compelling reason to have to keep her here as long as she is counseled on what to look for that she feels that she's able to get back to the hospital she has a problem that she is discharged on Decadron at a dose of at least 4 mg twice a day or 2 mg 4 times a day was scheduled follow-up with Dr. Christella Noa on Monday or Tuesday of this week and patient should be instructed to call  the office on Monday morning if she is discharged.  LOS: 3 days     Mansi Tokar P 11/26/2015, 8:40 AM

## 2015-11-26 NOTE — Discharge Summary (Signed)
Physician Discharge Summary  Cynthia Carson ZOX:[id number] DOB: 01-31-1951 DOA: 11/23/2015  PCP: Marden Noble, MD  Admit date: 11/23/2015 Discharge date: 11/26/2015  Time spent: 35 minutes  Recommendations for Outpatient Follow-up:  1. Cynthia Carson's imaging studies perform during this hospitalization highly suggestive of metastatic lung cancer. She underwent biopsy of supraclavicular lymph node, Please follow up on Pathology Report.  2. She was given follow up with Medical Oncology, Radiation Oncology and Neurosurgery.    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 Approximately one month ago, the patient was at [facility] where she was treated for pneumonia with azithromycin. She improved clinically. However one week prior to his admission, her symptoms of chest congestion and cough worsened. She went to see her primary care doctor who placed the patient on amoxicillin clavulanate. The patient went to see her primary care doctor on 11/22/2015. At that time, the patient was told to discontinue her amoxicillin clavulanate because of concern that he may been causing her slurred speech. Because her slow speech did not improve, the patient presented to the emergency department at [facility]. CT of the brain revealed multiple hemorrhagic mass brain lesions in the right cerebellar hemisphere with some mass effect as well as hemorrhagic mass lesions in right occipital and right parietal  lobes. Because of concerns for possible neurosurgical intervention, the patient was transferred to [facility]. Presently, the patient denies any fevers, chills, chest pain, suspect, nausea, vomiting, diarrhea, abdominal pain, hemoptysis, hematemesis, medication, melena, dysuria, hematuria. She denies any focal tremor weakness, diplopia, or syncope. She has intermittent headaches at baseline. This has not been worse than usual. In the emergency department, the patient's vital signs were stable. BMP showed potassium 3.1. Otherwise CBC, hepatic enzymes, and BMP were unremarkable. Chest x-ray showed left lower lobe consolidation although there is suspicion for possible endobronchial lesion. The patient had a mammogram within the last 12 months which was negative. She has never had a colonoscopy. The patient has had hysterectomy; she denies any vaginal bleeding.  Hospital Course:  Cynthia Carson is a pleasant 65 year old female with a history of 48-pack-year smoking history, diabetes mellitus, dyslipidemia, presented to the emergency department on 11/23/2015 with complaints of slurred speech that have been present for the past 4 days. She reported recently being treated 14 acquired pneumonia with azithromycin. She does not recall having a recent chest x-ray. Slurred speech was further worked up with a CT scan of brain without contrast that revealed multiple hemorrhagic mass lesions within the brain. She was further worked up with an MRI of brain that revealed a 2.5 x 3.6 x 2.2 cm hemorrhagic metastatic disease at right cerebellar hemisphere with additional too hemorrhagic metastatic lesions at right parietal and occipital lobes. CT scan of lungs with IV contrast showed central left lower lobe 3.8 cm lung mass having appearance of primary bronchogenic carcinoma. She was started on steroid therapy. Case was discussed with pulmonary critical care medicine for bronchoscopy. Radiation oncology has also been  consulted.  Suspected metastatic lung cancer. -Cynthia Carson having a history of tobacco abuse (47 pack years) presenting with slurred speech. Brain imaging that included CT scan and MRI revealed 2.5 x 3.6 x 2.2 cm hemorrhagic metastatic disease at right cerebellar hemisphere with 2 additional hemorrhagic metastatic lesions at parietal and occipital lobes. -CT scan of lungs with IV contrast showed central left lower lobe 3.8 cm lung mass. -Radiology reporting findings having appearance of metastatic primary bronchogenic carcinoma. -Radiation oncology has also been consulted for metastatic lesions involving brain. Patient likely benefit from radiation. -Plan to continue dexamethasone 4 mg IV QID as well as seizure prophylaxis with Keppra 500 mg by mouth twice a day. -On 11/25/2015 she underwent biopsy of right supraclavicular lymph node. Procedure to be performed by interventional radiology.  -She will follow up at the [facility] with Radiation Oncology and Medical Oncology.  -Follow up on Biopsy Report  2. Type 2 diabetes mellitus -She was discharged on Januvia 100 mg PO q daily  Procedures:  Right supraclavicular lymph node biopsy  Consultations:  Neurosurgery  Medical Oncology  Radiation Oncology  Discharge Exam: Filed Vitals:   11/25/15 2128 11/26/15 0532  BP: 135/77 135/71  Pulse: 68 61  Temp: 97.9 F (36.6 C) 97.6 F (36.4 C)  Resp: 18 18    General: Patient continues to have slurred speech although is awake alert no acute distress  Cardiovascular: regular rate and rhythm normal S1-S2 no murmurs rubs or gallops  Respiratory: few scattered respiratory wheezes, otherwise no crackles or rales, normal respiratory effort  Abdomen: soft nontender nondistended  Musculoskeletal: no edema   Discharge Instructions   Discharge Instructions    Call MD for:  difficulty breathing, headache or visual disturbances    Complete by:  As directed      Call MD for:   extreme fatigue    Complete by:  As directed      Call MD for:  hives    Complete by:  As directed      Call MD for:  persistant dizziness or light-headedness    Complete by:  As directed      Call MD for:  persistant nausea and vomiting    Complete by:  As directed      Call MD for:  redness, tenderness, or signs of infection (pain, swelling, redness, odor or green/yellow discharge around incision site)    Complete by:  As directed      Call MD for:  severe uncontrolled pain    Complete by:  As directed      Call MD for:  temperature >100.4    Complete by:  As directed      Call MD for:    Complete by:  As directed      Diet - low sodium heart healthy    Complete by:  As directed      Increase activity slowly    Complete by:  As directed           Discharge Medication List as of 11/26/2015 11:54 AM    START taking these medications   Details  dexamethasone (DECADRON) 2 MG tablet Take 4 mg PO BID for 5 days followed by 2 mg PO BID there after, Print      CONTINUE these medications which have CHANGED   Details  levETIRAcetam (KEPPRA) 500 MG tablet Take 1 tablet (500 mg total) by mouth 2 (two) times daily., Starting 11/26/2015, Until Discontinued, Print      CONTINUE  these medications which have NOT CHANGED   Details  acetaminophen (TYLENOL) 325 MG tablet Take 650 mg by mouth every 6 (six) hours as needed for mild pain., Until Discontinued, Historical Med    ALPRAZolam (XANAX) 0.5 MG tablet Take 0.5 mg by mouth 3 (three) times daily as needed for anxiety. , Until Discontinued, Historical Med    latanoprost (XALATAN) 0.005 % ophthalmic solution Place 1 drop into both eyes at bedtime., Until Discontinued, Historical Med    Omega-3 Fatty Acids (FISH OIL) 1000 MG CAPS Take 2,000 mg by mouth 2 (two) times daily. , Until Discontinued, Historical Med    omeprazole (PRILOSEC) 20 MG capsule Take 20 mg by mouth 2 (two) times daily. , Until Discontinued, Historical Med    sitaGLIPtin  (JANUVIA) 100 MG tablet Take 100 mg by mouth daily., Until Discontinued, Historical Med       Allergies  Allergen Reactions  . Amoxicillin Other (See Comments)    Reaction:  Unknown   . Levofloxacin Other (See Comments)    Reaction:  Joint and muscle pain  Pt states that this medication made her unable to walk.    Follow-up Information    Follow up with Marden Noble, MD In 2 weeks.   Specialty:  Internal Medicine   Contact information:   [address] [place] [address] [phone]       Follow up with CABBELL,KYLE L, MD In 1 week.   Specialty:  Neurosurgery   Contact information:   [address] [place] [address] [place] [address] [phone]       Follow up with Kyung Rudd, MD In 1 week.   Specialty:  Radiation Oncology   Contact information:   [address] [place] [address] [phone]       Follow up with Eilleen Kempf., MD In 3 days.   Specialty:  Oncology   Contact information:   [address] [place] [address] [phone]        The results of significant diagnostics from this hospitalization (including imaging, microbiology, ancillary and laboratory) are listed below for reference.    Significant Diagnostic Studies: Dg Chest 2 View  11/23/2015  CLINICAL DATA:  Shortness of Breath EXAM: CHEST  2 VIEW COMPARISON:  August 02, 2009 FINDINGS: There is airspace consolidation in the medial segment of the left lower lobe with volume loss. The lungs elsewhere clear. Heart size and pulmonary vascularity are normal. There is slight prominence in the azygos region compared to prior study. No other evidence suggesting potential adenopathy. No bone lesions. IMPRESSION: Airspace consolidation with volume loss medial segment left lower lobe. Suspect pneumonia, although an endobronchial lesion causing obstruction of the medial segment left lower lobe bronchus is a possibility. Lungs elsewhere clear. There is prominence in the azygos region  compared to the prior study. The possibility of localized lymph node enlargement must be of concern given this appearance unchanged from prior study. Given the findings above, contrast enhanced chest CT would be advisable to further assess. Electronically Signed   By: Lowella Grip III M.D.   On: 11/23/2015 13:51   Ct Head Wo Contrast  11/23/2015  CLINICAL DATA:  Slurred speech since 11/15/2015. EXAM: CT HEAD WITHOUT CONTRAST TECHNIQUE: Contiguous axial images were obtained from the base of the skull through the vertex without intravenous contrast. COMPARISON:  None. FINDINGS: The patient has multiple hemorrhagic mass lesions in the brain including a 3.5 cm hemorrhagic mass in the right cerebellar hemisphere which  has a slight mass effect upon the fourth ventricle. There is a 16 mm hemorrhagic metastasis in the posterior aspect of the right occipital lobe and there is a 15 mm hemorrhagic metastasis high in the right parietal lobe. There are several small focal areas of lucency in the periventricular white matter of frontal lobes. There is no midline shift. No acute osseous abnormality. IMPRESSION: Multiple hemorrhagic brain lesions. This most likely represents metastatic disease. Melanoma, renal cell carcinoma, choriocarcinoma, thyroid carcinoma and lung cancer, breast cancer, and hepatocellular carcinoma are the most common hemorrhagic metastases to the brain. Electronically Signed   By: Lorriane Shire M.D.   On: 11/23/2015 12:53   Ct Chest W Contrast  11/24/2015  CLINICAL DATA:  Inpatient with brain metastases of uncertain primary EXAM: CT CHEST, ABDOMEN, AND PELVIS WITH CONTRAST TECHNIQUE: Multidetector CT imaging of the chest, abdomen and pelvis was performed following the standard protocol during bolus administration of intravenous contrast. CONTRAST:  131m OMNIPAQUE IOHEXOL 300 MG/ML  SOLN COMPARISON:  Chest radiograph from one day prior. FINDINGS: CT CHEST Mediastinum/Nodes: Normal heart size. Trace  pericardial fluid/thickening. Atherosclerotic nonaneurysmal thoracic aorta. Normal caliber pulmonary arteries. No central pulmonary emboli. Normal visualized thyroid. Normal esophagus. No axillary lymphadenopathy. There is a mildly enlarged 1.0 cm right supraclavicular lymph node (series 201/ image 7). There are bulky confluent enlarged right paratracheal lymph nodes, largest 3.1 cm (series 201/image 18). There is a bulky 3.5 cm subcarinal node (series 201/image 27). There is an enlarged 1.3 cm prevascular lymph node in the upper left mediastinum (series 201/image 13). There is confluent right hilar lymphadenopathy, with a dominant 3.0 cm right hilar node (series 201/image 25). Lungs/Pleura: No pneumothorax. No pleural effusion. There is an approximately 3.8 x 2.5 cm lung mass in the central left lower lobe (series 205/ image 27), which occludes the left lower lobe bronchus, with associated complete left lower lobe atelectasis. No acute consolidative airspace disease, significant pulmonary nodules or lung masses in the remaining lung lobes. Musculoskeletal:  No aggressive appearing focal osseous lesions. CT ABDOMEN AND PELVIS Hepatobiliary: Normal liver with no liver mass. Normal gallbladder with no radiopaque cholelithiasis. No biliary ductal dilatation. Pancreas: Normal, with no mass or duct dilation. Spleen: Normal size. No mass. Adrenals/Urinary Tract: Normal right adrenal. A 1.6 cm left adrenal mass demonstrates indeterminate density of 93 HU. There is a 3.4 x 3.0 cm solid mass in the upper left kidney (series 301/image 80), which demonstrates slight hypoenhancement relative to the renal parenchyma. No hydronephrosis. Normal bladder. Stomach/Bowel: Grossly normal stomach. Normal caliber small bowel with no small bowel wall thickening. Normal appendix. Normal large bowel with no diverticulosis, large bowel wall thickening or pericolonic fat stranding. Vascular/Lymphatic: Atherosclerotic nonaneurysmal abdominal  aorta. Patent portal, splenic, hepatic and renal veins. There is a 1.2 x 1.1 cm mass in the left lower quadrant mesenteric (series 201/ image 96). Otherwise no lymphadenopathy in the abdomen or pelvis. Reproductive: Status post hysterectomy, with no abnormal findings at the vaginal cuff. No adnexal mass. Other: No pneumoperitoneum, ascites or focal fluid collection. Musculoskeletal: No aggressive appearing focal osseous lesions. IMPRESSION: 1. Central left lower lobe 3.8 cm lung mass, most in keeping with a primary bronchogenic carcinoma, which occludes the left lower lobe bronchus, with associated complete left lower lobe atelectasis. 2. Bulky ipsilateral, subcarinal and contralateral mediastinal lymphadenopathy. Bulky contralateral hilar lymphadenopathy. Contralateral supraclavicular lymphadenopathy. 3. Trace pericardial fluid/thickening.  No pleural effusion. 4. Solid 3.4 cm renal mass in the upper left kidney, favor a renal metastasis. 5.  Indeterminate left adrenal 1.6 cm mass, probably an adrenal metastasis. 6. Left lower quadrant 1.2 cm mesenteric mass, favor a hematogenous metastasis. Electronically Signed   By: Ilona Sorrel M.D.   On: 11/24/2015 08:16   Mr Cynthia Carson ST Contrast  11/23/2015  CLINICAL DATA:  Initial evaluation for intracranial metastasis. EXAM: MRI HEAD WITHOUT AND WITH CONTRAST TECHNIQUE: Multiplanar, multiecho pulse sequences of the brain and surrounding structures were obtained without and with intravenous contrast. CONTRAST:  66m MULTIHANCE GADOBENATE DIMEGLUMINE 529 MG/ML IV SOLN COMPARISON:  Prior CT from earlier the same day. FINDINGS: Age appropriate cerebral atrophy present. Patchy T2/FLAIR hyperintensity within the periventricular, deep, and subcortical white matter both cerebral hemispheres present, most like related to mild chronic small vessel ischemic disease. No acute infarct. Major intracranial vascular flow voids maintained. Previously identified hemorrhagic mass centered at  the right cerebellar hemisphere again seen, measuring 2.5 x 3.6 x 2.2 cm (AP by transverse by craniocaudad). Internal fluid fluid level present within this lesion. There is mild localized vasogenic edema with partial effacement of the fourth ventricle which remains patent. No hydrocephalus. Cerebellar tonsils are somewhat low lying at the upper limits of normal measuring 5-6 mm below the foramen magnum. Lesion abuts the right tentorium superiorly. There is focal enhancement along the tentorium itself (series 12, image 6). Second cortically based lesion within the right occipital lobe measures 1.4 x 1.5 x 1.3 cm (AP by transverse by craniocaudad). This lesion is also hemorrhagic in nature with internal fluid fluid level. Mild surrounding vasogenic edema without significant mass effect. Third lesion centered within the parasagittal high right parietal lobe measures 17 x 16 x 18 mm (AP by transverse by craniocaudad). This lesion is somewhat cystic in nature with minimal internal blood products. Mild localized vasogenic edema without significant mass effect. Possible additional tiny 6 mm lesion within the right occipital lobe without significant mass effect (series 11, image 13). Faint brush like enhancement measuring approximately 6 mm present within the parasagittal right frontal lobe (series 11, image 19). Indeterminate. Pituitary gland normal. No acute abnormality about the orbits. Sequela prior bilateral lens extraction noted. Paranasal sinuses are clear. No mastoid effusion. Inner ear structures normal. Bone marrow signal intensity within normal limits. IMPRESSION: 1. 2.5 x 3.6 x 2.2 cm hemorrhagic metastasis centered at the right cerebellar hemisphere. There is mild localized vasogenic edema with mild mass effect on the adjacent fourth ventricle without hydrocephalus. 2. Additional 2 hemorrhagic metastatic lesions within the right parietal and occipital lobes as above. Mild localized edema without significant  mass effect. 3. Additional possible tiny 6 mm metastasis within the right occipital lobe as above. Attention at follow-up. 4. Faint brush like enhancement within the parasagittal right frontal lobe as above, indeterminate. Attention at follow-up. 5. Cerebellar tonsils positioned at the upper limits of normal 5-6 mm below the foramen magnum. This is felt to be likely incidental and not due to edema from the right cerebellar metastasis. Electronically Signed   By: BJeannine BogaM.D.   On: 11/23/2015 22:00   Ct Abdomen Pelvis W Contrast  11/24/2015  CLINICAL DATA:  Inpatient with brain metastases of uncertain primary EXAM: CT CHEST, ABDOMEN, AND PELVIS WITH CONTRAST TECHNIQUE: Multidetector CT imaging of the chest, abdomen and pelvis was performed following the standard protocol during bolus administration of intravenous contrast. CONTRAST:  1063mOMNIPAQUE IOHEXOL 300 MG/ML  SOLN COMPARISON:  Chest radiograph from one day prior. FINDINGS: CT CHEST Mediastinum/Nodes: Normal heart size. Trace pericardial fluid/thickening. Atherosclerotic nonaneurysmal thoracic aorta. Normal  caliber pulmonary arteries. No central pulmonary emboli. Normal visualized thyroid. Normal esophagus. No axillary lymphadenopathy. There is a mildly enlarged 1.0 cm right supraclavicular lymph node (series 201/ image 7). There are bulky confluent enlarged right paratracheal lymph nodes, largest 3.1 cm (series 201/image 18). There is a bulky 3.5 cm subcarinal node (series 201/image 27). There is an enlarged 1.3 cm prevascular lymph node in the upper left mediastinum (series 201/image 13). There is confluent right hilar lymphadenopathy, with a dominant 3.0 cm right hilar node (series 201/image 25). Lungs/Pleura: No pneumothorax. No pleural effusion. There is an approximately 3.8 x 2.5 cm lung mass in the central left lower lobe (series 205/ image 27), which occludes the left lower lobe bronchus, with associated complete left lower lobe  atelectasis. No acute consolidative airspace disease, significant pulmonary nodules or lung masses in the remaining lung lobes. Musculoskeletal:  No aggressive appearing focal osseous lesions. CT ABDOMEN AND PELVIS Hepatobiliary: Normal liver with no liver mass. Normal gallbladder with no radiopaque cholelithiasis. No biliary ductal dilatation. Pancreas: Normal, with no mass or duct dilation. Spleen: Normal size. No mass. Adrenals/Urinary Tract: Normal right adrenal. A 1.6 cm left adrenal mass demonstrates indeterminate density of 93 HU. There is a 3.4 x 3.0 cm solid mass in the upper left kidney (series 301/image 80), which demonstrates slight hypoenhancement relative to the renal parenchyma. No hydronephrosis. Normal bladder. Stomach/Bowel: Grossly normal stomach. Normal caliber small bowel with no small bowel wall thickening. Normal appendix. Normal large bowel with no diverticulosis, large bowel wall thickening or pericolonic fat stranding. Vascular/Lymphatic: Atherosclerotic nonaneurysmal abdominal aorta. Patent portal, splenic, hepatic and renal veins. There is a 1.2 x 1.1 cm mass in the left lower quadrant mesenteric (series 201/ image 96). Otherwise no lymphadenopathy in the abdomen or pelvis. Reproductive: Status post hysterectomy, with no abnormal findings at the vaginal cuff. No adnexal mass. Other: No pneumoperitoneum, ascites or focal fluid collection. Musculoskeletal: No aggressive appearing focal osseous lesions. IMPRESSION: 1. Central left lower lobe 3.8 cm lung mass, most in keeping with a primary bronchogenic carcinoma, which occludes the left lower lobe bronchus, with associated complete left lower lobe atelectasis. 2. Bulky ipsilateral, subcarinal and contralateral mediastinal lymphadenopathy. Bulky contralateral hilar lymphadenopathy. Contralateral supraclavicular lymphadenopathy. 3. Trace pericardial fluid/thickening.  No pleural effusion. 4. Solid 3.4 cm renal mass in the upper left kidney,  favor a renal metastasis. 5. Indeterminate left adrenal 1.6 cm mass, probably an adrenal metastasis. 6. Left lower quadrant 1.2 cm mesenteric mass, favor a hematogenous metastasis. Electronically Signed   By: Ilona Sorrel M.D.   On: 11/24/2015 08:16   Ir US Guide Bx Asp/drain  11/25/2015  CLINICAL DATA:  65 year old female with new diagnosis concerning for primary lung cancer with extensive mediastinal adenopathy and a possible metastatic right supraclavicular lymph node. EXAM: IR ULTRASOUND GUIDANCE Date: 11/25/2015 PROCEDURE: 1. Ultrasound-guided biopsy small right supraclavicular lymph node Interventional Radiologist:  Criselda Peaches, MD ANESTHESIA/SEDATION: None MEDICATIONS: None TECHNIQUE: Informed consent was obtained from the patient following explanation of the procedure, risks, benefits and alternatives. The patient understands, agrees and consents for the procedure. All questions were addressed. A time out was performed. The right supraclavicular fossa was sterilely prepped and draped in the standard fashion using chlorhexidine skin prep. The region was then interrogated with ultrasound. There is a small 9 x 7 mm rounded hypoechoic lymph node adjacent to the internal jugular vein which corresponds with the abnormality on the recent CT scan of the chest. Local anesthesia was then attained by infiltration  with 1% lidocaine. A small dermatotomy was made. Under real-time sonographic guidance, several 18 gauge core biopsies were obtained. Ultrasound was used to confirm placement of the biopsy needle within the lymph node on all passes. Lymph node samples were divided between formalin and saline and sent to pathology for further analysis. The patient tolerated the procedure well. COMPLICATIONS: None IMPRESSION: Successful ultrasound-guided core biopsy of right supraclavicular lymph node. Signed, Criselda Peaches, MD [facility] Electronically  Signed   By: Jacqulynn Cadet M.D.   On: 11/25/2015 17:02    Microbiology: Recent Results (from the past 240 hour(s))  Blood culture (routine x 2)     Status: None (Preliminary result)   Collection Time: 11/23/15  2:12 PM  Result Value Ref Range Status   Specimen Description BLOOD RIGHT ASSIST CONTROL  Final   Special Requests BOTTLES DRAWN AEROBIC AND ANAEROBIC [id number]  Final   Culture NO GROWTH 3 DAYS  Final   Report Status PENDING  Incomplete  Blood culture (routine x 2)     Status: None (Preliminary result)   Collection Time: 11/23/15  2:12 PM  Result Value Ref Range Status   Specimen Description BLOOD LEFT ASSIST CONTROL  Final   Special Requests BOTTLES DRAWN AEROBIC AND ANAEROBIC 2CCANA,4CCAERO  Final   Culture NO GROWTH 3 DAYS  Final   Report Status PENDING  Incomplete     Labs: Basic Metabolic Panel:  Recent Labs Lab 11/23/15 1235 11/23/15 1645 11/23/15 2303 11/25/15 0725  NA 140 140  --  139  K 3.1* 3.5  --  4.7  CL 104 103  --  105  CO2 27 26  --  23  GLUCOSE 257* 242*  --  323*  BUN 10 6  --  15  CREATININE 0.74 0.65  --  0.75  CALCIUM 9.7 9.9  --  10.0  MG  --   --  1.7  --    Liver Function Tests:  Recent Labs Lab 11/23/15 1235 11/23/15 1645  AST 13* 15  ALT 13* 14  ALKPHOS 69 66  BILITOT 0.7 0.7  PROT 7.8 7.7  ALBUMIN 3.7 3.0*   No results for input(s): LIPASE, AMYLASE in the last 168 hours. No results for input(s): AMMONIA in the last 168 hours. CBC:  Recent Labs Lab 11/23/15 1235 11/23/15 1645 11/25/15 0725  WBC 8.0 8.2 11.8*  NEUTROABS 5.4 5.4  --   HGB 12.2 12.0 11.4*  HCT 36.5 36.1 36.3  MCV 89.3 89.8 92.6  PLT 360 369 410*   Cardiac Enzymes: No results for input(s): CKTOTAL, CKMB, CKMBINDEX, TROPONINI in the last 168 hours. BNP: BNP (last 3 results) No results for input(s): BNP in the last 8760 hours.  ProBNP (last 3 results) No results for input(s): PROBNP in the last 8760 hours.  CBG:  Recent Labs Lab  11/25/15 1147 11/25/15 1728 11/25/15 2127 11/26/15 0814 11/26/15 1210  GLUCAP 252* 191* 247* 263* 280*       Signed:  Kelvin Cellar MD  FACP  [facility] 11/26/2015, 12:55 PM

## 2015-11-26 NOTE — Progress Notes (Signed)
Patient discharged to home with instructions. 

## 2015-11-28 ENCOUNTER — Telehealth: Payer: Self-pay | Admitting: *Deleted

## 2015-11-28 ENCOUNTER — Encounter: Payer: Self-pay | Admitting: *Deleted

## 2015-11-28 ENCOUNTER — Encounter: Admission: AD | Payer: Self-pay | Source: Ambulatory Visit

## 2015-11-28 ENCOUNTER — Inpatient Hospital Stay: Admission: AD | Admit: 2015-11-28 | Payer: Self-pay | Source: Ambulatory Visit | Admitting: Internal Medicine

## 2015-11-28 DIAGNOSIS — C7931 Secondary malignant neoplasm of brain: Secondary | ICD-10-CM

## 2015-11-28 LAB — CULTURE, BLOOD (ROUTINE X 2)
Culture: NO GROWTH
Culture: NO GROWTH

## 2015-11-28 SURGERY — BRONCHOSCOPY, WITH EBUS
Anesthesia: General

## 2015-11-28 NOTE — Progress Notes (Signed)
Radiation Oncology RN, Thayer Headings called me back.  Patient will be set up with an appt after bx results are back approximately Tuesday or Wednesday.

## 2015-11-28 NOTE — Telephone Encounter (Signed)
Phone message from patient requesting bx results and tolet [place] know she was d/c hospital and is home, spoke with MD BX  Done 11/25/2015 results pending, and after calling pathology and speaking with Kim,,she stated Dr. Lucianne Lei is working on the report and results probably back Tuesday or Wednesday, called patient back and informed her results are still pending, patient thanked this Rn for return call 9:54 AM

## 2015-11-28 NOTE — Telephone Encounter (Signed)
Phone message  From Dana,RN she has scheduled appt with Dr. Julien Nordmann 12/14/15, to let Dr. Lisbeth Renshaw know, returned call to [facility], and we are waiting for bx results pending, will let Dr. Lisbeth Renshaw know of the med onc appt 12:31 PM

## 2015-11-28 NOTE — Telephone Encounter (Signed)
Oncology Nurse Navigator Documentation  Oncology Nurse Navigator Flowsheets 11/28/2015  Navigator Location [facility]-Med Onc  Navigator Encounter Type Introductory phone call/ Patient was asked to call Dr. Julien Nordmann for an appt.  I was given referral.  Patient has multiple brain mets and is being set up with Rad Onc according to notes in [facility].  I spoke with Dr. Julien Nordmann and asked if 12/14/15 was ok to schedule for patient.  He stated yes.  I called patient and scheduled on 12/14/15 with Dr. Julien Nordmann. I will follow up with Rad Onc to see about appt with them.    Confirmed Diagnosis Date 11/25/2015  Treatment Phase Abnormal Scans  Barriers/Navigation Needs Coordination of Care  Interventions Coordination of Care  Coordination of Care Appts  Acuity Level 2  Time Spent with Patient 15

## 2015-11-29 ENCOUNTER — Telehealth: Payer: Self-pay | Admitting: *Deleted

## 2015-11-29 NOTE — Telephone Encounter (Signed)
Called pathology#[id number] spoke with [facility], path report seen by Dr. Lucianne Lei yesterday and has sent bx  Path  for stains, still pending, may be back by tomorrow, will call in am to see if back yet,thakned Tammy 8:59 AM

## 2015-12-02 ENCOUNTER — Ambulatory Visit: Payer: BLUE CROSS/BLUE SHIELD

## 2015-12-02 ENCOUNTER — Ambulatory Visit: Payer: BLUE CROSS/BLUE SHIELD | Admitting: Radiation Oncology

## 2015-12-02 ENCOUNTER — Inpatient Hospital Stay: Payer: BLUE CROSS/BLUE SHIELD | Admitting: Oncology

## 2015-12-05 ENCOUNTER — Encounter: Payer: Self-pay | Admitting: Oncology

## 2015-12-05 ENCOUNTER — Ambulatory Visit: Admission: RE | Admit: 2015-12-05 | Payer: BLUE CROSS/BLUE SHIELD | Source: Ambulatory Visit

## 2015-12-05 ENCOUNTER — Ambulatory Visit
Admission: RE | Admit: 2015-12-05 | Discharge: 2015-12-05 | Disposition: A | Discharge status: Home / Self Care | Payer: BLUE CROSS/BLUE SHIELD | Source: Ambulatory Visit

## 2015-12-05 ENCOUNTER — Encounter: Payer: Self-pay | Admitting: Radiation Oncology

## 2015-12-05 ENCOUNTER — Ambulatory Visit
Admission: RE | Admit: 2015-12-05 | Discharge: 2015-12-05 | Disposition: A | Discharge status: Home / Self Care | Payer: BLUE CROSS/BLUE SHIELD | Source: Ambulatory Visit | Attending: Radiation Oncology | Admitting: Radiation Oncology

## 2015-12-05 ENCOUNTER — Inpatient Hospital Stay: Payer: BLUE CROSS/BLUE SHIELD | Attending: Oncology | Admitting: Oncology

## 2015-12-05 ENCOUNTER — Inpatient Hospital Stay: Payer: BLUE CROSS/BLUE SHIELD

## 2015-12-05 VITALS — BP 136/81 | HR 76 | Temp 96.7°F | Resp 18 | Wt 157.8 lb

## 2015-12-05 DIAGNOSIS — Z87891 Personal history of nicotine dependence: Secondary | ICD-10-CM | POA: Diagnosis not present

## 2015-12-05 DIAGNOSIS — Z79899 Other long term (current) drug therapy: Secondary | ICD-10-CM | POA: Diagnosis not present

## 2015-12-05 DIAGNOSIS — N2889 Other specified disorders of kidney and ureter: Secondary | ICD-10-CM | POA: Diagnosis not present

## 2015-12-05 DIAGNOSIS — R590 Localized enlarged lymph nodes: Secondary | ICD-10-CM | POA: Insufficient documentation

## 2015-12-05 DIAGNOSIS — C3432 Malignant neoplasm of lower lobe, left bronchus or lung: Secondary | ICD-10-CM | POA: Insufficient documentation

## 2015-12-05 DIAGNOSIS — E1165 Type 2 diabetes mellitus with hyperglycemia: Secondary | ICD-10-CM | POA: Diagnosis not present

## 2015-12-05 DIAGNOSIS — F419 Anxiety disorder, unspecified: Secondary | ICD-10-CM | POA: Diagnosis not present

## 2015-12-05 DIAGNOSIS — Z801 Family history of malignant neoplasm of trachea, bronchus and lung: Secondary | ICD-10-CM | POA: Diagnosis not present

## 2015-12-05 DIAGNOSIS — C7970 Secondary malignant neoplasm of unspecified adrenal gland: Secondary | ICD-10-CM | POA: Diagnosis not present

## 2015-12-05 DIAGNOSIS — R599 Enlarged lymph nodes, unspecified: Secondary | ICD-10-CM | POA: Insufficient documentation

## 2015-12-05 DIAGNOSIS — C3490 Malignant neoplasm of unspecified part of unspecified bronchus or lung: Secondary | ICD-10-CM

## 2015-12-05 DIAGNOSIS — E785 Hyperlipidemia, unspecified: Secondary | ICD-10-CM | POA: Diagnosis not present

## 2015-12-05 DIAGNOSIS — R1904 Left lower quadrant abdominal swelling, mass and lump: Secondary | ICD-10-CM | POA: Insufficient documentation

## 2015-12-05 DIAGNOSIS — Z7984 Long term (current) use of oral hypoglycemic drugs: Secondary | ICD-10-CM | POA: Insufficient documentation

## 2015-12-05 DIAGNOSIS — C7931 Secondary malignant neoplasm of brain: Secondary | ICD-10-CM | POA: Diagnosis not present

## 2015-12-05 DIAGNOSIS — E279 Disorder of adrenal gland, unspecified: Secondary | ICD-10-CM | POA: Insufficient documentation

## 2015-12-05 DIAGNOSIS — F17210 Nicotine dependence, cigarettes, uncomplicated: Secondary | ICD-10-CM | POA: Insufficient documentation

## 2015-12-05 DIAGNOSIS — Z8701 Personal history of pneumonia (recurrent): Secondary | ICD-10-CM

## 2015-12-05 DIAGNOSIS — C770 Secondary and unspecified malignant neoplasm of lymph nodes of head, face and neck: Secondary | ICD-10-CM | POA: Insufficient documentation

## 2015-12-05 DIAGNOSIS — Z510 Encounter for antineoplastic radiation therapy: Secondary | ICD-10-CM | POA: Insufficient documentation

## 2015-12-05 DIAGNOSIS — E119 Type 2 diabetes mellitus without complications: Secondary | ICD-10-CM | POA: Insufficient documentation

## 2015-12-05 DIAGNOSIS — R600 Localized edema: Secondary | ICD-10-CM | POA: Diagnosis not present

## 2015-12-05 HISTORY — DX: Malignant neoplasm of unspecified part of unspecified bronchus or lung: C34.90

## 2015-12-05 LAB — COMPREHENSIVE METABOLIC PANEL
ALBUMIN: 3.8 g/dL (ref 3.5–5.0)
ALK PHOS: 77 U/L (ref 38–126)
ALT: 20 U/L (ref 14–54)
AST: 15 U/L (ref 15–41)
Anion gap: 10 (ref 5–15)
BILIRUBIN TOTAL: 0.7 mg/dL (ref 0.3–1.2)
BUN: 21 mg/dL — AB (ref 6–20)
CALCIUM: 9.4 mg/dL (ref 8.9–10.3)
CHLORIDE: 93 mmol/L — AB (ref 101–111)
CO2: 25 mmol/L (ref 22–32)
CREATININE: 0.88 mg/dL (ref 0.44–1.00)
GFR calc Af Amer: >60 mL/min (ref >60)
Glucose, Bld: 352 mg/dL — ABNORMAL HIGH (ref 65–99)
Potassium: 3.5 mmol/L (ref 3.5–5.1)
SODIUM: 128 mmol/L — AB (ref 135–145)
TOTAL PROTEIN: 7.5 g/dL (ref 6.5–8.1)

## 2015-12-05 LAB — CBC WITH DIFFERENTIAL/PLATELET
BASOS ABS: 0.1 10*3/uL (ref 0–0.1)
BASOS PCT: 1 %
EOS ABS: 0 10*3/uL (ref 0–0.7)
EOS PCT: 0 %
HCT: 41.8 % (ref 35.0–47.0)
HEMOGLOBIN: 14 g/dL (ref 12.0–16.0)
LYMPHS ABS: 2 10*3/uL (ref 1.0–3.6)
Lymphocytes Relative: 14 %
MCH: 29.8 pg (ref 26.0–34.0)
MCHC: 33.4 g/dL (ref 32.0–36.0)
MCV: 89 fL (ref 80.0–100.0)
Monocytes Absolute: 1.1 10*3/uL — ABNORMAL HIGH (ref 0.2–0.9)
Monocytes Relative: 8 %
NEUTROS PCT: 77 %
Neutro Abs: 11.2 10*3/uL — ABNORMAL HIGH (ref 1.4–6.5)
PLATELETS: 358 10*3/uL (ref 150–440)
RBC: 4.7 MIL/uL (ref 3.80–5.20)
RDW: 13.8 % (ref 11.5–14.5)
WBC: 14.4 10*3/uL — AB (ref 3.6–11.0)

## 2015-12-05 MED ORDER — DEXAMETHASONE 4 MG PO TABS: ORAL_TABLET | ORAL | Status: DC

## 2015-12-05 NOTE — Consult Note (Signed)
Except an outstanding is perfect of Radiation Oncology NEW PATIENT EVALUATION  Name: Cynthia Carson  MRN: [id number]  Date:   12/05/2015     DOB: 1951/01/22   This 65 y.o. female patient presents to the clinic for initial evaluation of brain metastases from extensive stage small cell lung cancer.  REFERRING PHYSICIAN: Kelvin Cellar, MD  CHIEF COMPLAINT: No chief complaint on file.   DIAGNOSIS: The encounter diagnosis was Brain metastases ([place]).   PREVIOUS INVESTIGATIONS:  MRI scan of the brain CT scan of the chest abdomen and pelvis all reviewed Pathology reports reviewed Clinical notes reviewed  HPI: Patient is a 65 year old female who presented with apraxia and a aphasia and was seen in the emergency department where brain scan revealed multiple brain metastasis. MRI confirmed at least 3 dominant lesions with multiple smaller lesions probable. She underwent a CT scan of the abdomen chest and pelvis showing central left lower lobe 4 cm mass consistent with primary bronchogenic carcinoma. He was also bulky ipsilateral subcarinal and contralateral mediastinal adenopathy. There is also a 3.4 cm renal mass in the upper left kidney favoring metastasis as well as a 1.6 cm adrenal mass again probably adrenal metastasis. Biopsy of her right supraclavicular node was positive for metastatic carcinoma consistent with small cell primary. She has been started on steroids. She's been seen by medical oncology and systemic chemotherapy is planned. I been asked to evaluate the patient for palliative radiation therapy to her brain. She's having no focal sensory or motor loss. No change in visual fields. Continues to have some difficulty grasping for words and with her speech.  PLANNED TREATMENT REGIMEN: Whole brain radiation  PAST MEDICAL HISTORY:  has a past medical history of Diabetes mellitus without complication ([place]).    PAST SURGICAL HISTORY:  Past Surgical History  Procedure Laterality Date  .  Abdominal hysterectomy      FAMILY HISTORY: family history is not on file.  SOCIAL HISTORY:  reports that she quit smoking about 2 weeks ago. She does not have any smokeless tobacco history on file. She reports that she does not drink alcohol or use illicit drugs.  ALLERGIES: Amoxicillin and Levofloxacin  MEDICATIONS:  Current Outpatient Prescriptions  Medication Sig Dispense Refill  . ALPRAZolam (XANAX) 0.5 MG tablet Take 0.5 mg by mouth 3 (three) times daily as needed for anxiety.     Marland Kitchen dexamethasone (DECADRON) 4 MG tablet 1 po daily in am with breakfast 30 tablet 3  . glimepiride (AMARYL) 2 MG tablet     . latanoprost (XALATAN) 0.005 % ophthalmic solution Place 1 drop into both eyes at bedtime.    . levETIRAcetam (KEPPRA) 500 MG tablet Take 1 tablet (500 mg total) by mouth 2 (two) times daily. 60 tablet 0  . Omega-3 Fatty Acids (FISH OIL) 1000 MG CAPS Take 2,000 mg by mouth 2 (two) times daily.     Marland Kitchen omeprazole (PRILOSEC) 20 MG capsule Take 20 mg by mouth 2 (two) times daily.     . sitaGLIPtin (JANUVIA) 100 MG tablet Take 100 mg by mouth daily.     No current facility-administered medications for this encounter.    ECOG PERFORMANCE STATUS:  1 - Symptomatic but completely ambulatory  REVIEW OF SYSTEMS: Except for the slurred speech apraxia and a aphasia Patient denies any weight loss, fatigue, weakness, fever, chills or night sweats. Patient denies any loss of vision, blurred vision. Patient denies any ringing  of the ears or hearing loss. No irregular heartbeat. Patient denies heart  murmur or history of fainting. Patient denies any chest pain or pain radiating to her upper extremities. Patient denies any shortness of breath, difficulty breathing at night, cough or hemoptysis. Patient denies any swelling in the lower legs. Patient denies any nausea vomiting, vomiting of blood, or coffee ground material in the vomitus. Patient denies any stomach pain. Patient states has had normal bowel  movements no significant constipation or diarrhea. Patient denies any dysuria, hematuria or significant nocturia. Patient denies any problems walking, swelling in the joints or loss of balance. Patient denies any skin changes, loss of hair or loss of weight. Patient denies any excessive worrying or anxiety or significant depression. Patient denies any problems with insomnia. Patient denies excessive thirst, polyuria, polydipsia. Patient denies any swollen glands, patient denies easy bruising or easy bleeding. Patient denies any recent infections, allergies or URI. Patient "s visual fields have not changed significantly in recent time.    PHYSICAL EXAM: There were no vitals taken for this visit. A well-developed female in NAD. Cranial nerves II through XII are grossly intact fundi are benign bilaterally. Crude visual fields are within normal range. Motor sensory and DTR levels are equal and symmetric in the upper and lower extremities. Proprioception is intact. Well-developed well-nourished patient in NAD. HEENT reveals PERLA, EOMI, discs not visualized.  Oral cavity is clear. No oral mucosal lesions are identified. Neck is clear without evidence of cervical or supraclavicular adenopathy. Lungs are clear to A&P. Cardiac examination is essentially unremarkable with regular rate and rhythm without murmur rub or thrill. Abdomen is benign with no organomegaly or masses noted. Motor sensory and DTR levels are equal and symmetric in the upper and lower extremities. Cranial nerves II through XII are grossly intact. Proprioception is intact. No peripheral adenopathy or edema is identified. No motor or sensory levels are noted. Crude visual fields are within normal range.   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 Would plan on delivering 3000 cGy in 12 fractions. Risks and benefits of treatment including hair loss fatigue alteration of blood counts scalp reaction all were discussed in detail with the patient and her husband. I have set up and ordered CT simulation for tomorrow. We'll coordinate her start of chemotherapy with medical oncology as I hope to start her whole brain treatments this week.  I would like to take this opportunity for allowing me to participate in the care of your patient.Armstead Peaks., MD

## 2015-12-05 NOTE — Progress Notes (Signed)
Patient here today as new evaluation regarding small cell lung cancer.  Referred by Dr. Lisbeth Renshaw.

## 2015-12-05 NOTE — Progress Notes (Signed)
[place] @ [facility] [phone]  Fax:([phone] [place]  Vonya Ohalloran OB: [birth year]  MR#: [id number]  PVV#:[id number]  Patient Care Team: Marden Noble, MD as PCP - General (Internal Medicine)  CHIEF COMPLAINT: Oncology history Chief Complaint  Patient presents with  . New Evaluation   1.   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 and treat mass adrenal mass.  Diagnosis in January of 2017.  Diagnosis by the right supraclavicular lymph node biopsy. INTERVAL HISTORY:   66 year old female with a history of diabetes mellitus, hyperlipidemia and anxiety presented to emergency room with [facility] with 3-4 days of slurred speech.. Approximately one month ago patient was in [facility] where she was treated for pneumonia. CT scan of the brain@[facility]  revealed multiple hemorrhagic passes.  The right cerebellar hemisphere with some mass effect.  Patient was transferred to [facility] for neurosurgical evaluation. Reevaluation with CT scan of the chest revealed down 3.8 cm left lower lobe mass with multiple bilateral media asked or adenopathy.  Patient was started on steroid.  A biopsy from the left supraclavicular lymph node was positive for small cell undifferentiated carcinoma of lung and patient was referred to be [place] for possible radiation therapy and chemotherapy. Sent quite emotional at present time.  Accompanied with the husband.  Appetite has been stable.   VISIT DIAGNOSIS:     ICD-9-CM ICD-10-CM   1. Small cell lung cancer, unspecified laterality (HCC) 162.9 C34.90 dexamethasone (DECADRON) 4 MG tablet     CBC with Differential     Comprehensive metabolic panel  2. Cancer of lower lobe of left lung (HCC) 162.5 C34.32       No history exists.    Oncology Flowsheet 11/24/2015 11/25/2015 11/25/2015 11/25/2015 11/26/2015 11/26/2015 11/26/2015  ALPRAZolam (XANAX) PO    0.5 mg     0.5 mg      dexamethasone (DECADRON) PO 4 mg 4 mg 4 mg 4 mg 4 mg 4 mg 4 mg     REVIEW OF SYSTEMS:   Gen. status: Patient is quite emotional.  Not in any acute distress. HEENT: Slurred speech.  Headache. Lungs: Increasing shortness of breath patient just quit smoking no hemoptysis or chest pain GI: No nausea no vomiting no diarrhea poor appetite Lower extremity no swelling Skin: No rash Neurological system and slurred speech.  No other focal signs Musculoskeletal system no bony pain GU: No dysuria or hematuria   As per HPI. Otherwise, a complete review of systems is negatve.  PAST MEDICAL HISTORY: Past Medical History  Diagnosis Date  . Diabetes mellitus without complication ([place])   . Cancer of lung ([place]) 12/05/2015    PAST SURGICAL HISTORY: Past Surgical History  Procedure Laterality Date  . Abdominal hysterectomy      FAMILY HISTORYThere is no significant family history of breast cancer, ovarian cancer, colon cancer  History of lung cancer in family     ADVANCED DIRECTIVES:  Patient does not have any living will or healthcare power of attorney.  Information was given .  Available resources had been discussed.  We will follow-up on subsequent appointments regarding this issue  HEALTH MAINTENANCE: Social History  Substance Use Topics  . Smoking status: Former Smoker    Quit date: 11/19/2015  . Smokeless tobacco: None  . Alcohol Use: No      Allergies  Allergen Reactions  . Amoxicillin Other (See Comments)  Reaction:  Unknown   . Levofloxacin Other (See Comments)    Reaction:  Joint and muscle pain  Pt states that this medication made her unable to walk.     Current Outpatient Prescriptions  Medication Sig Dispense Refill  . ALPRAZolam (XANAX) 0.5 MG tablet Take 0.5 mg by mouth 3 (three) times daily as needed for anxiety.     Marland Kitchen latanoprost (XALATAN) 0.005 % ophthalmic solution Place 1 drop into both eyes at bedtime.    . levETIRAcetam (KEPPRA)  500 MG tablet Take 1 tablet (500 mg total) by mouth 2 (two) times daily. 60 tablet 0  . Omega-3 Fatty Acids (FISH OIL) 1000 MG CAPS Take 2,000 mg by mouth 2 (two) times daily.     Marland Kitchen omeprazole (PRILOSEC) 20 MG capsule Take 20 mg by mouth 2 (two) times daily.     . sitaGLIPtin (JANUVIA) 100 MG tablet Take 100 mg by mouth daily.    Marland Kitchen dexamethasone (DECADRON) 2 MG tablet     . dexamethasone (DECADRON) 4 MG tablet 1 po daily in am with breakfast 30 tablet 3  . glimepiride (AMARYL) 2 MG tablet      No current facility-administered medications for this visit.    OBJECTIVE: PHYSICAL EXAM: General   status: Patient is alert oriented not any acute distress HEENT: No soreness in the mouth. Lymphatic system: Palpable right supraclavicular lymph node small no other lymphadenopathy Lungs: Bilateral wheezing Cardiac: Tachycardia Skin: No rash Lower extremity swelling 1+ Neurological system: Higher functions patient has dysphagia. All other cranial nodes are intact Motor system equal on both sides without any significant loss of power Sensory system no localizing sign Cerebellar ataxia grade 1 Psychiatric system: Anxiety mild depression All other systems have been reviewed.  Filed Vitals:   12/05/15 1339  BP: 136/81  Pulse: 76  Temp: 96.7 F (35.9 C)  Resp: 18     Body mass index is 26.27 kg/(m^2).    ECOG FS:2 - Symptomatic, <50% confined to bed  LAB RESULTS:  Appointment on 12/05/2015  Component Date Value Ref Range Status  . WBC 12/05/2015 14.4* 3.6 - 11.0 K/uL Final  . RBC 12/05/2015 4.70  3.80 - 5.20 MIL/uL Final  . Hemoglobin 12/05/2015 14.0  12.0 - 16.0 g/dL Final  . HCT 12/05/2015 41.8  35.0 - 47.0 % Final  . MCV 12/05/2015 89.0  80.0 - 100.0 fL Final  . MCH 12/05/2015 29.8  26.0 - 34.0 pg Final  . MCHC 12/05/2015 33.4  32.0 - 36.0 g/dL Final  . RDW 12/05/2015 13.8  11.5 - 14.5 % Final  . Platelets 12/05/2015 358  150 - 440 K/uL Final  . Neutrophils Relative % 12/05/2015 77    Final  . Neutro Abs 12/05/2015 11.2* 1.4 - 6.5 K/uL Final  . Lymphocytes Relative 12/05/2015 14   Final  . Lymphs Abs 12/05/2015 2.0  1.0 - 3.6 K/uL Final  . Monocytes Relative 12/05/2015 8   Final  . Monocytes Absolute 12/05/2015 1.1* 0.2 - 0.9 K/uL Final  . Eosinophils Relative 12/05/2015 0   Final  . Eosinophils Absolute 12/05/2015 0.0  0 - 0.7 K/uL Final  . Basophils Relative 12/05/2015 1   Final  . Basophils Absolute 12/05/2015 0.1  0 - 0.1 K/uL Final  . Sodium 12/05/2015 128* 135 - 145 mmol/L Final  . Potassium 12/05/2015 3.5  3.5 - 5.1 mmol/L Final  . Chloride 12/05/2015 93* 101 - 111 mmol/L Final  . CO2 12/05/2015 25  22 - 32  mmol/L Final  . Glucose, Bld 12/05/2015 352* 65 - 99 mg/dL Final  . BUN 12/05/2015 21* 6 - 20 mg/dL Final  . Creatinine, Ser 12/05/2015 0.88  0.44 - 1.00 mg/dL Final  . Calcium 12/05/2015 9.4  8.9 - 10.3 mg/dL Final  . Total Protein 12/05/2015 7.5  6.5 - 8.1 g/dL Final  . Albumin 12/05/2015 3.8  3.5 - 5.0 g/dL Final  . AST 12/05/2015 15  15 - 41 U/L Final  . ALT 12/05/2015 20  14 - 54 U/L Final  . Alkaline Phosphatase 12/05/2015 77  38 - 126 U/L Final  . Total Bilirubin 12/05/2015 0.7  0.3 - 1.2 mg/dL Final  . GFR calc non Af Amer 12/05/2015 >60  >60 mL/min Final  . GFR calc Af Amer 12/05/2015 >60  >60 mL/min Final   Comment: (NOTE) The eGFR has been calculated using the CKD EPI equation. This calculation has not been validated in all clinical situations. eGFR's persistently <60 mL/min signify possible Chronic Kidney Disease.   . Anion gap 12/05/2015 10  5 - 15 Final     STUDIES: Dg Chest 2 View  11/23/2015  CLINICAL DATA:  Shortness of Breath EXAM: CHEST  2 VIEW COMPARISON:  August 02, 2009 FINDINGS: There is airspace consolidation in the medial segment of the left lower lobe with volume loss. The lungs elsewhere clear. Heart size and pulmonary vascularity are normal. There is slight prominence in the azygos region compared to prior study. No  other evidence suggesting potential adenopathy. No bone lesions. IMPRESSION: Airspace consolidation with volume loss medial segment left lower lobe. Suspect pneumonia, although an endobronchial lesion causing obstruction of the medial segment left lower lobe bronchus is a possibility. Lungs elsewhere clear. There is prominence in the azygos region compared to the prior study. The possibility of localized lymph node enlargement must be of concern given this appearance unchanged from prior study. Given the findings above, contrast enhanced chest CT would be advisable to further assess. Electronically Signed   By: Lowella Grip III M.D.   On: 11/23/2015 13:51   Ct Head Wo Contrast  11/23/2015  CLINICAL DATA:  Slurred speech since 11/15/2015. EXAM: CT HEAD WITHOUT CONTRAST TECHNIQUE: Contiguous axial images were obtained from the base of the skull through the vertex without intravenous contrast. COMPARISON:  None. FINDINGS: The patient has multiple hemorrhagic mass lesions in the brain including a 3.5 cm hemorrhagic mass in the right cerebellar hemisphere which has a slight mass effect upon the fourth ventricle. There is a 16 mm hemorrhagic metastasis in the posterior aspect of the right occipital lobe and there is a 15 mm hemorrhagic metastasis high in the right parietal lobe. There are several small focal areas of lucency in the periventricular white matter of frontal lobes. There is no midline shift. No acute osseous abnormality. IMPRESSION: Multiple hemorrhagic brain lesions. This most likely represents metastatic disease. Melanoma, renal cell carcinoma, choriocarcinoma, thyroid carcinoma and lung cancer, breast cancer, and hepatocellular carcinoma are the most common hemorrhagic metastases to the brain. Electronically Signed   By: Lorriane Shire M.D.   On: 11/23/2015 12:53   Ct Chest W Contrast  11/24/2015  CLINICAL DATA:  Inpatient with brain metastases of uncertain primary EXAM: CT CHEST, ABDOMEN, AND  PELVIS WITH CONTRAST TECHNIQUE: Multidetector CT imaging of the chest, abdomen and pelvis was performed following the standard protocol during bolus administration of intravenous contrast. CONTRAST:  131m OMNIPAQUE IOHEXOL 300 MG/ML  SOLN COMPARISON:  Chest radiograph from one day prior.  FINDINGS: CT CHEST Mediastinum/Nodes: Normal heart size. Trace pericardial fluid/thickening. Atherosclerotic nonaneurysmal thoracic aorta. Normal caliber pulmonary arteries. No central pulmonary emboli. Normal visualized thyroid. Normal esophagus. No axillary lymphadenopathy. There is a mildly enlarged 1.0 cm right supraclavicular lymph node (series 201/ image 7). There are bulky confluent enlarged right paratracheal lymph nodes, largest 3.1 cm (series 201/image 18). There is a bulky 3.5 cm subcarinal node (series 201/image 27). There is an enlarged 1.3 cm prevascular lymph node in the upper left mediastinum (series 201/image 13). There is confluent right hilar lymphadenopathy, with a dominant 3.0 cm right hilar node (series 201/image 25). Lungs/Pleura: No pneumothorax. No pleural effusion. There is an approximately 3.8 x 2.5 cm lung mass in the central left lower lobe (series 205/ image 27), which occludes the left lower lobe bronchus, with associated complete left lower lobe atelectasis. No acute consolidative airspace disease, significant pulmonary nodules or lung masses in the remaining lung lobes. Musculoskeletal:  No aggressive appearing focal osseous lesions. CT ABDOMEN AND PELVIS Hepatobiliary: Normal liver with no liver mass. Normal gallbladder with no radiopaque cholelithiasis. No biliary ductal dilatation. Pancreas: Normal, with no mass or duct dilation. Spleen: Normal size. No mass. Adrenals/Urinary Tract: Normal right adrenal. A 1.6 cm left adrenal mass demonstrates indeterminate density of 93 HU. There is a 3.4 x 3.0 cm solid mass in the upper left kidney (series 301/image 80), which demonstrates slight  hypoenhancement relative to the renal parenchyma. No hydronephrosis. Normal bladder. Stomach/Bowel: Grossly normal stomach. Normal caliber small bowel with no small bowel wall thickening. Normal appendix. Normal large bowel with no diverticulosis, large bowel wall thickening or pericolonic fat stranding. Vascular/Lymphatic: Atherosclerotic nonaneurysmal abdominal aorta. Patent portal, splenic, hepatic and renal veins. There is a 1.2 x 1.1 cm mass in the left lower quadrant mesenteric (series 201/ image 96). Otherwise no lymphadenopathy in the abdomen or pelvis. Reproductive: Status post hysterectomy, with no abnormal findings at the vaginal cuff. No adnexal mass. Other: No pneumoperitoneum, ascites or focal fluid collection. Musculoskeletal: No aggressive appearing focal osseous lesions. IMPRESSION: 1. Central left lower lobe 3.8 cm lung mass, most in keeping with a primary bronchogenic carcinoma, which occludes the left lower lobe bronchus, with associated complete left lower lobe atelectasis. 2. Bulky ipsilateral, subcarinal and contralateral mediastinal lymphadenopathy. Bulky contralateral hilar lymphadenopathy. Contralateral supraclavicular lymphadenopathy. 3. Trace pericardial fluid/thickening.  No pleural effusion. 4. Solid 3.4 cm renal mass in the upper left kidney, favor a renal metastasis. 5. Indeterminate left adrenal 1.6 cm mass, probably an adrenal metastasis. 6. Left lower quadrant 1.2 cm mesenteric mass, favor a hematogenous metastasis. Electronically Signed   By: Ilona Sorrel M.D.   On: 11/24/2015 08:16   Mr Jeri Cos ZO Contrast  11/23/2015  CLINICAL DATA:  Initial evaluation for intracranial metastasis. EXAM: MRI HEAD WITHOUT AND WITH CONTRAST TECHNIQUE: Multiplanar, multiecho pulse sequences of the brain and surrounding structures were obtained without and with intravenous contrast. CONTRAST:  72m MULTIHANCE GADOBENATE DIMEGLUMINE 529 MG/ML IV SOLN COMPARISON:  Prior CT from earlier the same day.  FINDINGS: Age appropriate cerebral atrophy present. Patchy T2/FLAIR hyperintensity within the periventricular, deep, and subcortical white matter both cerebral hemispheres present, most like related to mild chronic small vessel ischemic disease. No acute infarct. Major intracranial vascular flow voids maintained. Previously identified hemorrhagic mass centered at the right cerebellar hemisphere again seen, measuring 2.5 x 3.6 x 2.2 cm (AP by transverse by craniocaudad). Internal fluid fluid level present within this lesion. There is mild localized vasogenic edema with partial effacement  of the fourth ventricle which remains patent. No hydrocephalus. Cerebellar tonsils are somewhat low lying at the upper limits of normal measuring 5-6 mm below the foramen magnum. Lesion abuts the right tentorium superiorly. There is focal enhancement along the tentorium itself (series 12, image 6). Second cortically based lesion within the right occipital lobe measures 1.4 x 1.5 x 1.3 cm (AP by transverse by craniocaudad). This lesion is also hemorrhagic in nature with internal fluid fluid level. Mild surrounding vasogenic edema without significant mass effect. Third lesion centered within the parasagittal high right parietal lobe measures 17 x 16 x 18 mm (AP by transverse by craniocaudad). This lesion is somewhat cystic in nature with minimal internal blood products. Mild localized vasogenic edema without significant mass effect. Possible additional tiny 6 mm lesion within the right occipital lobe without significant mass effect (series 11, image 13). Faint brush like enhancement measuring approximately 6 mm present within the parasagittal right frontal lobe (series 11, image 19). Indeterminate. Pituitary gland normal. No acute abnormality about the orbits. Sequela prior bilateral lens extraction noted. Paranasal sinuses are clear. No mastoid effusion. Inner ear structures normal. Bone marrow signal intensity within normal limits.  IMPRESSION: 1. 2.5 x 3.6 x 2.2 cm hemorrhagic metastasis centered at the right cerebellar hemisphere. There is mild localized vasogenic edema with mild mass effect on the adjacent fourth ventricle without hydrocephalus. 2. Additional 2 hemorrhagic metastatic lesions within the right parietal and occipital lobes as above. Mild localized edema without significant mass effect. 3. Additional possible tiny 6 mm metastasis within the right occipital lobe as above. Attention at follow-up. 4. Faint brush like enhancement within the parasagittal right frontal lobe as above, indeterminate. Attention at follow-up. 5. Cerebellar tonsils positioned at the upper limits of normal 5-6 mm below the foramen magnum. This is felt to be likely incidental and not due to edema from the right cerebellar metastasis. Electronically Signed   By: Jeannine Boga M.D.   On: 11/23/2015 22:00   Ct Abdomen Pelvis W Contrast  11/24/2015  CLINICAL DATA:  Inpatient with brain metastases of uncertain primary EXAM: CT CHEST, ABDOMEN, AND PELVIS WITH CONTRAST TECHNIQUE: Multidetector CT imaging of the chest, abdomen and pelvis was performed following the standard protocol during bolus administration of intravenous contrast. CONTRAST:  129m OMNIPAQUE IOHEXOL 300 MG/ML  SOLN COMPARISON:  Chest radiograph from one day prior. FINDINGS: CT CHEST Mediastinum/Nodes: Normal heart size. Trace pericardial fluid/thickening. Atherosclerotic nonaneurysmal thoracic aorta. Normal caliber pulmonary arteries. No central pulmonary emboli. Normal visualized thyroid. Normal esophagus. No axillary lymphadenopathy. There is a mildly enlarged 1.0 cm right supraclavicular lymph node (series 201/ image 7). There are bulky confluent enlarged right paratracheal lymph nodes, largest 3.1 cm (series 201/image 18). There is a bulky 3.5 cm subcarinal node (series 201/image 27). There is an enlarged 1.3 cm prevascular lymph node in the upper left mediastinum (series 201/image  13). There is confluent right hilar lymphadenopathy, with a dominant 3.0 cm right hilar node (series 201/image 25). Lungs/Pleura: No pneumothorax. No pleural effusion. There is an approximately 3.8 x 2.5 cm lung mass in the central left lower lobe (series 205/ image 27), which occludes the left lower lobe bronchus, with associated complete left lower lobe atelectasis. No acute consolidative airspace disease, significant pulmonary nodules or lung masses in the remaining lung lobes. Musculoskeletal:  No aggressive appearing focal osseous lesions. CT ABDOMEN AND PELVIS Hepatobiliary: Normal liver with no liver mass. Normal gallbladder with no radiopaque cholelithiasis. No biliary ductal dilatation. Pancreas: Normal, with no  mass or duct dilation. Spleen: Normal size. No mass. Adrenals/Urinary Tract: Normal right adrenal. A 1.6 cm left adrenal mass demonstrates indeterminate density of 93 HU. There is a 3.4 x 3.0 cm solid mass in the upper left kidney (series 301/image 80), which demonstrates slight hypoenhancement relative to the renal parenchyma. No hydronephrosis. Normal bladder. Stomach/Bowel: Grossly normal stomach. Normal caliber small bowel with no small bowel wall thickening. Normal appendix. Normal large bowel with no diverticulosis, large bowel wall thickening or pericolonic fat stranding. Vascular/Lymphatic: Atherosclerotic nonaneurysmal abdominal aorta. Patent portal, splenic, hepatic and renal veins. There is a 1.2 x 1.1 cm mass in the left lower quadrant mesenteric (series 201/ image 96). Otherwise no lymphadenopathy in the abdomen or pelvis. Reproductive: Status post hysterectomy, with no abnormal findings at the vaginal cuff. No adnexal mass. Other: No pneumoperitoneum, ascites or focal fluid collection. Musculoskeletal: No aggressive appearing focal osseous lesions. IMPRESSION: 1. Central left lower lobe 3.8 cm lung mass, most in keeping with a primary bronchogenic carcinoma, which occludes the left  lower lobe bronchus, with associated complete left lower lobe atelectasis. 2. Bulky ipsilateral, subcarinal and contralateral mediastinal lymphadenopathy. Bulky contralateral hilar lymphadenopathy. Contralateral supraclavicular lymphadenopathy. 3. Trace pericardial fluid/thickening.  No pleural effusion. 4. Solid 3.4 cm renal mass in the upper left kidney, favor a renal metastasis. 5. Indeterminate left adrenal 1.6 cm mass, probably an adrenal metastasis. 6. Left lower quadrant 1.2 cm mesenteric mass, favor a hematogenous metastasis. Electronically Signed   By: Ilona Sorrel M.D.   On: 11/24/2015 08:16   Ir US Guide Bx Asp/drain  11/25/2015  CLINICAL DATA:  65 year old female with new diagnosis concerning for primary lung cancer with extensive mediastinal adenopathy and a possible metastatic right supraclavicular lymph node. EXAM: IR ULTRASOUND GUIDANCE Date: 11/25/2015 PROCEDURE: 1. Ultrasound-guided biopsy small right supraclavicular lymph node Interventional Radiologist:  Criselda Peaches, MD ANESTHESIA/SEDATION: None MEDICATIONS: None TECHNIQUE: Informed consent was obtained from the patient following explanation of the procedure, risks, benefits and alternatives. The patient understands, agrees and consents for the procedure. All questions were addressed. A time out was performed. The right supraclavicular fossa was sterilely prepped and draped in the standard fashion using chlorhexidine skin prep. The region was then interrogated with ultrasound. There is a small 9 x 7 mm rounded hypoechoic lymph node adjacent to the internal jugular vein which corresponds with the abnormality on the recent CT scan of the chest. Local anesthesia was then attained by infiltration with 1% lidocaine. A small dermatotomy was made. Under real-time sonographic guidance, several 18 gauge core biopsies were obtained. Ultrasound was used to confirm placement of the biopsy needle within the lymph node on all passes. Lymph node samples  were divided between formalin and saline and sent to pathology for further analysis. The patient tolerated the procedure well. COMPLICATIONS: None IMPRESSION: Successful ultrasound-guided core biopsy of right supraclavicular lymph node. Signed, Criselda Peaches, MD [facility] Radiology Electronically Signed   By: Jacqulynn Cadet M.D.   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  Both mediastinal lymph node.  Ms. and treatment mass.  Probably metastases to the kidney. Adrenal metastases 2.  Diabetes on multiple medication presently blood sugar is high because patient is on Decadron 3.  I discussed situation with Dr. Baruch Gouty regarding radiation therapy to the brain. Considering extensive disease outside the brain possibility of starting chemotherapy as soon as possible with  carboplatinum and VP-16  will attend chemotherapy class  May help with arrange for port placement Review all ct  scan with the patient and family all the x-rays were reviewed independently pathology report has been reviewed. Possibility of of starting carboplatinum and VP-16 as soon as possible Intent of chemotherapy is palliation and relief in symptoms and extending survival Also her to discuss with the patient regarding living will Husband was present during the discussion and review of scan  PLAN:  No problem-specific assessment & plan notes found for this encounter.   Patient expressed understanding and was in agreement with this plan. She also understands that She can call clinic at any time with any questions, concerns, or complaints.    Cancer of lower lobe of left lung ([place])   Staging form: Lung, AJCC 7th Edition     Clinical: T2, N3, M1 - Signed by Forest Gleason, MD on 12/05/2015   Forest Gleason, MD   12/05/2015 4:23 PM

## 2015-12-06 ENCOUNTER — Ambulatory Visit
Admission: RE | Admit: 2015-12-06 | Discharge: 2015-12-06 | Disposition: A | Discharge status: Home / Self Care | Payer: BLUE CROSS/BLUE SHIELD | Source: Ambulatory Visit | Attending: Radiation Oncology | Admitting: Radiation Oncology

## 2015-12-06 ENCOUNTER — Telehealth: Payer: Self-pay | Admitting: *Deleted

## 2015-12-06 ENCOUNTER — Ambulatory Visit: Payer: BLUE CROSS/BLUE SHIELD | Admitting: Oncology

## 2015-12-06 DIAGNOSIS — Z7984 Long term (current) use of oral hypoglycemic drugs: Secondary | ICD-10-CM | POA: Diagnosis not present

## 2015-12-06 DIAGNOSIS — E279 Disorder of adrenal gland, unspecified: Secondary | ICD-10-CM | POA: Diagnosis not present

## 2015-12-06 DIAGNOSIS — E119 Type 2 diabetes mellitus without complications: Secondary | ICD-10-CM | POA: Diagnosis not present

## 2015-12-06 DIAGNOSIS — Z510 Encounter for antineoplastic radiation therapy: Secondary | ICD-10-CM | POA: Diagnosis not present

## 2015-12-06 DIAGNOSIS — F17210 Nicotine dependence, cigarettes, uncomplicated: Secondary | ICD-10-CM | POA: Diagnosis not present

## 2015-12-06 DIAGNOSIS — Z79899 Other long term (current) drug therapy: Secondary | ICD-10-CM | POA: Diagnosis not present

## 2015-12-06 DIAGNOSIS — R599 Enlarged lymph nodes, unspecified: Secondary | ICD-10-CM | POA: Diagnosis not present

## 2015-12-06 DIAGNOSIS — C7931 Secondary malignant neoplasm of brain: Secondary | ICD-10-CM | POA: Diagnosis not present

## 2015-12-06 DIAGNOSIS — C3432 Malignant neoplasm of lower lobe, left bronchus or lung: Secondary | ICD-10-CM | POA: Diagnosis not present

## 2015-12-06 DIAGNOSIS — N2889 Other specified disorders of kidney and ureter: Secondary | ICD-10-CM | POA: Diagnosis not present

## 2015-12-06 NOTE — Telephone Encounter (Signed)
Contacted patient to introduce navigation program, ensure patient has my contact information, and review plan of care. Discussed timing of radiation start and chemo as well as chemo class. Patient will call with any concerns and I will follow.

## 2015-12-07 ENCOUNTER — Telehealth: Payer: Self-pay | Admitting: *Deleted

## 2015-12-07 NOTE — Telephone Encounter (Signed)
States she thought she was to decrease her dexamethasone from 2 mg bid to once a day, but new rx is for 4 mg daily. Per Dr Jeb Levering, she needs 4 mg once a day until she completes her radiation therapy, then he will decrease her dose. Pt informed of this and repeated it back to me

## 2015-12-08 ENCOUNTER — Encounter: Payer: Self-pay | Admitting: *Deleted

## 2015-12-08 ENCOUNTER — Ambulatory Visit
Admission: RE | Admit: 2015-12-08 | Discharge: 2015-12-08 | Disposition: A | Discharge status: Home / Self Care | Payer: BLUE CROSS/BLUE SHIELD | Source: Ambulatory Visit | Attending: Radiation Oncology | Admitting: Radiation Oncology

## 2015-12-08 DIAGNOSIS — C7931 Secondary malignant neoplasm of brain: Secondary | ICD-10-CM | POA: Diagnosis not present

## 2015-12-09 ENCOUNTER — Encounter: Payer: Self-pay | Admitting: *Deleted

## 2015-12-09 ENCOUNTER — Ambulatory Visit
Admission: RE | Admit: 2015-12-09 | Discharge: 2015-12-09 | Disposition: A | Discharge status: Home / Self Care | Payer: BLUE CROSS/BLUE SHIELD | Source: Ambulatory Visit | Attending: Radiation Oncology | Admitting: Radiation Oncology

## 2015-12-09 DIAGNOSIS — C7931 Secondary malignant neoplasm of brain: Secondary | ICD-10-CM | POA: Diagnosis not present

## 2015-12-12 ENCOUNTER — Ambulatory Visit
Admission: RE | Admit: 2015-12-12 | Discharge: 2015-12-12 | Disposition: A | Discharge status: Home / Self Care | Payer: BLUE CROSS/BLUE SHIELD | Source: Ambulatory Visit | Attending: Radiation Oncology | Admitting: Radiation Oncology

## 2015-12-12 DIAGNOSIS — C7931 Secondary malignant neoplasm of brain: Secondary | ICD-10-CM | POA: Diagnosis not present

## 2015-12-13 ENCOUNTER — Ambulatory Visit
Admission: RE | Admit: 2015-12-13 | Discharge: 2015-12-13 | Disposition: A | Discharge status: Home / Self Care | Payer: BLUE CROSS/BLUE SHIELD | Source: Ambulatory Visit | Attending: Radiation Oncology | Admitting: Radiation Oncology

## 2015-12-13 ENCOUNTER — Encounter: Payer: Self-pay | Admitting: *Deleted

## 2015-12-13 DIAGNOSIS — C7931 Secondary malignant neoplasm of brain: Secondary | ICD-10-CM | POA: Diagnosis not present

## 2015-12-14 ENCOUNTER — Other Ambulatory Visit: Payer: BLUE CROSS/BLUE SHIELD

## 2015-12-14 ENCOUNTER — Other Ambulatory Visit: Payer: Self-pay | Admitting: *Deleted

## 2015-12-14 ENCOUNTER — Ambulatory Visit
Admission: RE | Admit: 2015-12-14 | Discharge: 2015-12-14 | Disposition: A | Discharge status: Home / Self Care | Payer: BLUE CROSS/BLUE SHIELD | Source: Ambulatory Visit | Attending: Radiation Oncology | Admitting: Radiation Oncology

## 2015-12-14 ENCOUNTER — Ambulatory Visit: Payer: BLUE CROSS/BLUE SHIELD | Admitting: Internal Medicine

## 2015-12-14 DIAGNOSIS — C7931 Secondary malignant neoplasm of brain: Secondary | ICD-10-CM | POA: Diagnosis not present

## 2015-12-14 MED ORDER — SULFAMETHOXAZOLE-TRIMETHOPRIM 800-160 MG PO TABS: 1.0000 | ORAL_TABLET | Freq: Two times a day (BID) | ORAL | Status: DC

## 2015-12-14 NOTE — Patient Instructions (Signed)
Etoposide, VP-16 injection  What is this medicine?  ETOPOSIDE, VP-16 (e toe POE side) is a chemotherapy drug. It is used to treat testicular cancer, lung cancer, and other cancers.  This medicine may be used for other purposes; ask your health care provider or pharmacist if you have questions.  What should I tell my health care provider before I take this medicine?  They need to know if you have any of these conditions:  -infection  -kidney disease  -low blood counts, like low white cell, platelet, or red cell counts  -an unusual or allergic reaction to etoposide, other chemotherapeutic agents, other medicines, foods, dyes, or preservatives  -pregnant or trying to get pregnant  -breast-feeding  How should I use this medicine?  This medicine is for infusion into a vein. It is administered in a hospital or clinic by a specially trained health care professional.  Talk to your pediatrician regarding the use of this medicine in children. Special care may be needed.  Overdosage: If you think you have taken too much of this medicine contact a poison control center or emergency room at once.  NOTE: This medicine is only for you. Do not share this medicine with others.  What if I miss a dose?  It is important not to miss your dose. Call your doctor or health care professional if you are unable to keep an appointment.  What may interact with this medicine?  -aspirin  -certain medications for seizures like carbamazepine, phenobarbital, phenytoin, valproic acid  -cyclosporine  -levamisole  -warfarin  This list may not describe all possible interactions. Give your health care provider a list of all the medicines, herbs, non-prescription drugs, or dietary supplements you use. Also tell them if you smoke, drink alcohol, or use illegal drugs. Some items may interact with your medicine.  What should I watch for while using this medicine?  Visit your doctor for checks on your progress. This drug may make you feel generally unwell.  This is not uncommon, as chemotherapy can affect healthy cells as well as cancer cells. Report any side effects. Continue your course of treatment even though you feel ill unless your doctor tells you to stop.  In some cases, you may be given additional medicines to help with side effects. Follow all directions for their use.  Call your doctor or health care professional for advice if you get a fever, chills or sore throat, or other symptoms of a cold or flu. Do not treat yourself. This drug decreases your body's ability to fight infections. Try to avoid being around people who are sick.  This medicine may increase your risk to bruise or bleed. Call your doctor or health care professional if you notice any unusual bleeding.  Be careful brushing and flossing your teeth or using a toothpick because you may get an infection or bleed more easily. If you have any dental work done, tell your dentist you are receiving this medicine.  Avoid taking products that contain aspirin, acetaminophen, ibuprofen, naproxen, or ketoprofen unless instructed by your doctor. These medicines may hide a fever.  Do not become pregnant while taking this medicine or for at least 6 months after stopping it. Women should inform their doctor if they wish to become pregnant or think they might be pregnant. Women of child-bearing potential will need to have a negative pregnancy test before starting this medicine. There is a potential for serious side effects to an unborn child. Talk to your health care professional or   4 months after stopping it. A latex condom is needed even if you have had a vasectomy. Contact your doctor right away if your partner becomes pregnant. Do not donate sperm while taking this medicine and for at least 4 months after you stop  taking this medicine. Men should inform their doctors if they wish to father a child. This medicine may lower sperm counts. What side effects may I notice from receiving this medicine? Side effects that you should report to your doctor or health care professional as soon as possible: -allergic reactions like skin rash, itching or hives, swelling of the face, lips, or tongue -low blood counts - this medicine may decrease the number of white blood cells, red blood cells and platelets. You may be at increased risk for infections and bleeding. -signs of infection - fever or chills, cough, sore throat, pain or difficulty passing urine -signs of decreased platelets or bleeding - bruising, pinpoint red spots on the skin, black, tarry stools, blood in the urine -signs of decreased red blood cells - unusually weak or tired, fainting spells, lightheadedness -breathing problems -changes in vision -mouth or throat sores or ulcers -pain, redness, swelling or irritation at the injection site -pain, tingling, numbness in the hands or feet -redness, blistering, peeling or loosening of the skin, including inside the mouth -seizures -vomiting Side effects that usually do not require medical attention (report to your doctor or health care professional if they continue or are bothersome): -diarrhea -hair loss -loss of appetite -nausea -stomach pain This list may not describe all possible side effects. Call your doctor for medical advice about side effects. You may report side effects to FDA at 1-800-FDA-1088. Where should I keep my medicine? This drug is given in a hospital or clinic and will not be stored at home. NOTE: This sheet is a summary. It may not cover all possible information. If you have questions about this medicine, talk to your doctor, pharmacist, or health care provider.    2016, [employer]. (2014-07-01 12:32:50) Etoposide, VP-16 injection What is this medicine? ETOPOSIDE,  VP-16 (e toe POE side) is a chemotherapy drug. It is used to treat testicular cancer, lung cancer, and other cancers. This medicine may be used for other purposes; ask your health care provider or pharmacist if you have questions. What should I tell my health care provider before I take this medicine? They need to know if you have any of these conditions: -infection -kidney disease -low blood counts, like low white cell, platelet, or red cell counts -an unusual or allergic reaction to etoposide, other chemotherapeutic agents, other medicines, foods, dyes, or preservatives -pregnant or trying to get pregnant -breast-feeding How should I use this medicine? This medicine is for infusion into a vein. It is administered in a hospital or clinic by a specially trained health care professional. Talk to your pediatrician regarding the use of this medicine in children. Special care may be needed. Overdosage: If you think you have taken too much of this medicine contact a poison control center or emergency room at once. NOTE: This medicine is only for you. Do not share this medicine with others. What if I miss a dose? It is important not to miss your dose. Call your doctor or health care professional if you are unable to keep an appointment. What may interact with this medicine? -aspirin -certain medications for seizures like carbamazepine, phenobarbital, phenytoin, valproic acid -cyclosporine -levamisole -warfarin This list may not describe all possible interactions. Give your health  care provider a list of all the medicines, herbs, non-prescription drugs, or dietary supplements you use. Also tell them if you smoke, drink alcohol, or use illegal drugs. Some items may interact with your medicine. What should I watch for while using this medicine? Visit your doctor for checks on your progress. This drug may make you feel generally unwell. This is not uncommon, as chemotherapy can affect healthy cells as  well as cancer cells. Report any side effects. Continue your course of treatment even though you feel ill unless your doctor tells you to stop. In some cases, you may be given additional medicines to help with side effects. Follow all directions for their use. Call your doctor or health care professional for advice if you get a fever, chills or sore throat, or other symptoms of a cold or flu. Do not treat yourself. This drug decreases your body's ability to fight infections. Try to avoid being around people who are sick. This medicine may increase your risk to bruise or bleed. Call your doctor or health care professional if you notice any unusual bleeding. Be careful brushing and flossing your teeth or using a toothpick because you may get an infection or bleed more easily. If you have any dental work done, tell your dentist you are receiving this medicine. Avoid taking products that contain aspirin, acetaminophen, ibuprofen, naproxen, or ketoprofen unless instructed by your doctor. These medicines may hide a fever. Do not become pregnant while taking this medicine or for at least 6 months after stopping it. Women should inform their doctor if they wish to become pregnant or think they might be pregnant. Women of child-bearing potential will need to have a negative pregnancy test before starting this medicine. There is a potential for serious side effects to an unborn child. Talk to your health care professional or pharmacist for more information. Do not breast-feed an infant while taking this medicine. Men must use a latex condom during sexual contact with a woman while taking this medicine and for at least 4 months after stopping it. A latex condom is needed even if you have had a vasectomy. Contact your doctor right away if your partner becomes pregnant. Do not donate sperm while taking this medicine and for at least 4 months after you stop taking this medicine. Men should inform their doctors if they wish  to father a child. This medicine may lower sperm counts. What side effects may I notice from receiving this medicine? Side effects that you should report to your doctor or health care professional as soon as possible: -allergic reactions like skin rash, itching or hives, swelling of the face, lips, or tongue -low blood counts - this medicine may decrease the number of white blood cells, red blood cells and platelets. You may be at increased risk for infections and bleeding. -signs of infection - fever or chills, cough, sore throat, pain or difficulty passing urine -signs of decreased platelets or bleeding - bruising, pinpoint red spots on the skin, black, tarry stools, blood in the urine -signs of decreased red blood cells - unusually weak or tired, fainting spells, lightheadedness -breathing problems -changes in vision -mouth or throat sores or ulcers -pain, redness, swelling or irritation at the injection site -pain, tingling, numbness in the hands or feet -redness, blistering, peeling or loosening of the skin, including inside the mouth -seizures -vomiting Side effects that usually do not require medical attention (report to your doctor or health care professional if they continue or are bothersome): -  diarrhea -hair loss -loss of appetite -nausea -stomach pain This list may not describe all possible side effects. Call your doctor for medical advice about side effects. You may report side effects to FDA at 1-800-FDA-1088. Where should I keep my medicine? This drug is given in a hospital or clinic and will not be stored at home. NOTE: This sheet is a summary. It may not cover all possible information. If you have questions about this medicine, talk to your doctor, pharmacist, or health care provider.    2016, [employer]. (2014-07-01 12:32:50) Carboplatin injection What is this medicine? CARBOPLATIN (KAR boe pla tin) is a chemotherapy drug. It targets fast dividing cells,  like cancer cells, and causes these cells to die. This medicine is used to treat ovarian cancer and many other cancers. This medicine may be used for other purposes; ask your health care provider or pharmacist if you have questions. What should I tell my health care provider before I take this medicine? They need to know if you have any of these conditions: -blood disorders -hearing problems -kidney disease -recent or ongoing radiation therapy -an unusual or allergic reaction to carboplatin, cisplatin, other chemotherapy, other medicines, foods, dyes, or preservatives -pregnant or trying to get pregnant -breast-feeding How should I use this medicine? This drug is usually given as an infusion into a vein. It is administered in a hospital or clinic by a specially trained health care professional. Talk to your pediatrician regarding the use of this medicine in children. Special care may be needed. Overdosage: If you think you have taken too much of this medicine contact a poison control center or emergency room at once. NOTE: This medicine is only for you. Do not share this medicine with others. What if I miss a dose? It is important not to miss a dose. Call your doctor or health care professional if you are unable to keep an appointment. What may interact with this medicine? -medicines for seizures -medicines to increase blood counts like filgrastim, pegfilgrastim, sargramostim -some antibiotics like amikacin, gentamicin, neomycin, streptomycin, tobramycin -vaccines Talk to your doctor or health care professional before taking any of these medicines: -acetaminophen -aspirin -ibuprofen -ketoprofen -naproxen This list may not describe all possible interactions. Give your health care provider a list of all the medicines, herbs, non-prescription drugs, or dietary supplements you use. Also tell them if you smoke, drink alcohol, or use illegal drugs. Some items may interact with your  medicine. What should I watch for while using this medicine? Your condition will be monitored carefully while you are receiving this medicine. You will need important blood work done while you are taking this medicine. This drug may make you feel generally unwell. This is not uncommon, as chemotherapy can affect healthy cells as well as cancer cells. Report any side effects. Continue your course of treatment even though you feel ill unless your doctor tells you to stop. In some cases, you may be given additional medicines to help with side effects. Follow all directions for their use. Call your doctor or health care professional for advice if you get a fever, chills or sore throat, or other symptoms of a cold or flu. Do not treat yourself. This drug decreases your body's ability to fight infections. Try to avoid being around people who are sick. This medicine may increase your risk to bruise or bleed. Call your doctor or health care professional if you notice any unusual bleeding. Be careful brushing and flossing your teeth or using a  toothpick because you may get an infection or bleed more easily. If you have any dental work done, tell your dentist you are receiving this medicine. Avoid taking products that contain aspirin, acetaminophen, ibuprofen, naproxen, or ketoprofen unless instructed by your doctor. These medicines may hide a fever. Do not become pregnant while taking this medicine. Women should inform their doctor if they wish to become pregnant or think they might be pregnant. There is a potential for serious side effects to an unborn child. Talk to your health care professional or pharmacist for more information. Do not breast-feed an infant while taking this medicine. What side effects may I notice from receiving this medicine? Side effects that you should report to your doctor or health care professional as soon as possible: -allergic reactions like skin rash, itching or hives, swelling of  the face, lips, or tongue -signs of infection - fever or chills, cough, sore throat, pain or difficulty passing urine -signs of decreased platelets or bleeding - bruising, pinpoint red spots on the skin, black, tarry stools, nosebleeds -signs of decreased red blood cells - unusually weak or tired, fainting spells, lightheadedness -breathing problems -changes in hearing -changes in vision -chest pain -high blood pressure -low blood counts - This drug may decrease the number of white blood cells, red blood cells and platelets. You may be at increased risk for infections and bleeding. -nausea and vomiting -pain, swelling, redness or irritation at the injection site -pain, tingling, numbness in the hands or feet -problems with balance, talking, walking -trouble passing urine or change in the amount of urine Side effects that usually do not require medical attention (report to your doctor or health care professional if they continue or are bothersome): -hair loss -loss of appetite -metallic taste in the mouth or changes in taste This list may not describe all possible side effects. Call your doctor for medical advice about side effects. You may report side effects to FDA at 1-800-FDA-1088. Where should I keep my medicine? This drug is given in a hospital or clinic and will not be stored at home. NOTE: This sheet is a summary. It may not cover all possible information. If you have questions about this medicine, talk to your doctor, pharmacist, or health care provider.    2016, [employer]. (2008-02-10 14:38:05)

## 2015-12-15 ENCOUNTER — Ambulatory Visit
Admission: RE | Admit: 2015-12-15 | Discharge: 2015-12-15 | Disposition: A | Discharge status: Home / Self Care | Payer: BLUE CROSS/BLUE SHIELD | Source: Ambulatory Visit | Attending: Radiation Oncology | Admitting: Radiation Oncology

## 2015-12-15 ENCOUNTER — Inpatient Hospital Stay: Payer: BLUE CROSS/BLUE SHIELD

## 2015-12-15 DIAGNOSIS — C7931 Secondary malignant neoplasm of brain: Secondary | ICD-10-CM | POA: Diagnosis not present

## 2015-12-16 ENCOUNTER — Ambulatory Visit
Admission: RE | Admit: 2015-12-16 | Discharge: 2015-12-16 | Disposition: A | Discharge status: Home / Self Care | Payer: BLUE CROSS/BLUE SHIELD | Source: Ambulatory Visit | Attending: Radiation Oncology | Admitting: Radiation Oncology

## 2015-12-16 DIAGNOSIS — C7931 Secondary malignant neoplasm of brain: Secondary | ICD-10-CM | POA: Diagnosis not present

## 2015-12-19 ENCOUNTER — Inpatient Hospital Stay: Payer: BLUE CROSS/BLUE SHIELD

## 2015-12-19 ENCOUNTER — Encounter: Payer: Self-pay | Admitting: *Deleted

## 2015-12-19 ENCOUNTER — Ambulatory Visit
Admission: RE | Admit: 2015-12-19 | Discharge: 2015-12-19 | Disposition: A | Discharge status: Home / Self Care | Payer: BLUE CROSS/BLUE SHIELD | Source: Ambulatory Visit | Attending: Radiation Oncology | Admitting: Radiation Oncology

## 2015-12-19 ENCOUNTER — Inpatient Hospital Stay: Payer: BLUE CROSS/BLUE SHIELD | Admitting: Oncology

## 2015-12-19 DIAGNOSIS — C7931 Secondary malignant neoplasm of brain: Secondary | ICD-10-CM | POA: Diagnosis not present

## 2015-12-20 ENCOUNTER — Encounter: Payer: Self-pay | Admitting: *Deleted

## 2015-12-20 ENCOUNTER — Ambulatory Visit
Admission: RE | Admit: 2015-12-20 | Discharge: 2015-12-20 | Disposition: A | Discharge status: Home / Self Care | Payer: BLUE CROSS/BLUE SHIELD | Source: Ambulatory Visit | Attending: Radiation Oncology | Admitting: Radiation Oncology

## 2015-12-20 ENCOUNTER — Ambulatory Visit: Payer: BLUE CROSS/BLUE SHIELD

## 2015-12-20 ENCOUNTER — Inpatient Hospital Stay: Payer: BLUE CROSS/BLUE SHIELD

## 2015-12-20 DIAGNOSIS — C7931 Secondary malignant neoplasm of brain: Secondary | ICD-10-CM | POA: Diagnosis not present

## 2015-12-21 ENCOUNTER — Ambulatory Visit
Admission: RE | Admit: 2015-12-21 | Discharge: 2015-12-21 | Disposition: A | Discharge status: Home / Self Care | Payer: BLUE CROSS/BLUE SHIELD | Source: Ambulatory Visit | Attending: Radiation Oncology | Admitting: Radiation Oncology

## 2015-12-21 ENCOUNTER — Ambulatory Visit: Payer: BLUE CROSS/BLUE SHIELD

## 2015-12-21 ENCOUNTER — Encounter: Payer: Self-pay | Admitting: *Deleted

## 2015-12-21 DIAGNOSIS — C7931 Secondary malignant neoplasm of brain: Secondary | ICD-10-CM | POA: Diagnosis not present

## 2015-12-22 ENCOUNTER — Encounter: Payer: Self-pay | Admitting: *Deleted

## 2015-12-22 ENCOUNTER — Ambulatory Visit: Payer: BLUE CROSS/BLUE SHIELD

## 2015-12-22 ENCOUNTER — Ambulatory Visit
Admission: RE | Admit: 2015-12-22 | Discharge: 2015-12-22 | Disposition: A | Discharge status: Home / Self Care | Payer: BLUE CROSS/BLUE SHIELD | Source: Ambulatory Visit | Attending: Radiation Oncology | Admitting: Radiation Oncology

## 2015-12-22 DIAGNOSIS — C7931 Secondary malignant neoplasm of brain: Secondary | ICD-10-CM | POA: Diagnosis not present

## 2015-12-23 ENCOUNTER — Ambulatory Visit: Payer: BLUE CROSS/BLUE SHIELD

## 2015-12-23 ENCOUNTER — Encounter: Payer: Self-pay | Admitting: *Deleted

## 2015-12-23 ENCOUNTER — Ambulatory Visit
Admission: RE | Admit: 2015-12-23 | Discharge: 2015-12-23 | Disposition: A | Discharge status: Home / Self Care | Payer: BLUE CROSS/BLUE SHIELD | Source: Ambulatory Visit | Attending: Radiation Oncology | Admitting: Radiation Oncology

## 2015-12-23 DIAGNOSIS — C7931 Secondary malignant neoplasm of brain: Secondary | ICD-10-CM | POA: Diagnosis not present

## 2015-12-26 ENCOUNTER — Inpatient Hospital Stay: Payer: BLUE CROSS/BLUE SHIELD

## 2015-12-26 ENCOUNTER — Encounter: Payer: Self-pay | Admitting: Oncology

## 2015-12-26 ENCOUNTER — Other Ambulatory Visit: Payer: Self-pay | Admitting: *Deleted

## 2015-12-26 ENCOUNTER — Inpatient Hospital Stay (HOSPITAL_BASED_OUTPATIENT_CLINIC_OR_DEPARTMENT_OTHER): Payer: BLUE CROSS/BLUE SHIELD | Admitting: Oncology

## 2015-12-26 ENCOUNTER — Inpatient Hospital Stay: Payer: BLUE CROSS/BLUE SHIELD | Attending: Oncology

## 2015-12-26 VITALS — BP 108/72 | HR 103 | Temp 98.2°F | Wt 148.4 lb

## 2015-12-26 DIAGNOSIS — C3432 Malignant neoplasm of lower lobe, left bronchus or lung: Secondary | ICD-10-CM

## 2015-12-26 DIAGNOSIS — Z79899 Other long term (current) drug therapy: Secondary | ICD-10-CM

## 2015-12-26 DIAGNOSIS — C7970 Secondary malignant neoplasm of unspecified adrenal gland: Secondary | ICD-10-CM

## 2015-12-26 DIAGNOSIS — C770 Secondary and unspecified malignant neoplasm of lymph nodes of head, face and neck: Secondary | ICD-10-CM | POA: Insufficient documentation

## 2015-12-26 DIAGNOSIS — Z87891 Personal history of nicotine dependence: Secondary | ICD-10-CM | POA: Diagnosis not present

## 2015-12-26 DIAGNOSIS — Z923 Personal history of irradiation: Secondary | ICD-10-CM | POA: Diagnosis not present

## 2015-12-26 DIAGNOSIS — F419 Anxiety disorder, unspecified: Secondary | ICD-10-CM

## 2015-12-26 DIAGNOSIS — C7931 Secondary malignant neoplasm of brain: Secondary | ICD-10-CM | POA: Insufficient documentation

## 2015-12-26 DIAGNOSIS — R590 Localized enlarged lymph nodes: Secondary | ICD-10-CM

## 2015-12-26 DIAGNOSIS — E119 Type 2 diabetes mellitus without complications: Secondary | ICD-10-CM | POA: Diagnosis not present

## 2015-12-26 DIAGNOSIS — Z8701 Personal history of pneumonia (recurrent): Secondary | ICD-10-CM

## 2015-12-26 DIAGNOSIS — Z7689 Persons encountering health services in other specified circumstances: Secondary | ICD-10-CM

## 2015-12-26 DIAGNOSIS — Z5111 Encounter for antineoplastic chemotherapy: Secondary | ICD-10-CM | POA: Diagnosis not present

## 2015-12-26 DIAGNOSIS — K1230 Oral mucositis (ulcerative), unspecified: Secondary | ICD-10-CM | POA: Diagnosis not present

## 2015-12-26 DIAGNOSIS — E785 Hyperlipidemia, unspecified: Secondary | ICD-10-CM | POA: Diagnosis not present

## 2015-12-26 DIAGNOSIS — C7900 Secondary malignant neoplasm of unspecified kidney and renal pelvis: Secondary | ICD-10-CM | POA: Diagnosis not present

## 2015-12-26 LAB — COMPREHENSIVE METABOLIC PANEL
ALBUMIN: 3.1 g/dL — AB (ref 3.5–5.0)
ALK PHOS: 76 U/L (ref 38–126)
ALT: 15 U/L (ref 14–54)
AST: 13 U/L — ABNORMAL LOW (ref 15–41)
Anion gap: 9 (ref 5–15)
BILIRUBIN TOTAL: 0.8 mg/dL (ref 0.3–1.2)
BUN: 13 mg/dL (ref 6–20)
CALCIUM: 8.9 mg/dL (ref 8.9–10.3)
CO2: 26 mmol/L (ref 22–32)
CREATININE: 0.73 mg/dL (ref 0.44–1.00)
Chloride: 94 mmol/L — ABNORMAL LOW (ref 101–111)
GFR calc non Af Amer: >60 mL/min (ref >60)
GLUCOSE: 314 mg/dL — AB (ref 65–99)
Potassium: 3.5 mmol/L (ref 3.5–5.1)
SODIUM: 129 mmol/L — AB (ref 135–145)
Total Protein: 7.1 g/dL (ref 6.5–8.1)

## 2015-12-26 LAB — MAGNESIUM: Magnesium: 1.7 mg/dL (ref 1.7–2.4)

## 2015-12-26 LAB — CBC WITH DIFFERENTIAL/PLATELET
Basophils Absolute: 0.1 10*3/uL (ref 0–0.1)
Basophils Relative: 1 %
EOS ABS: 0.1 10*3/uL (ref 0–0.7)
Eosinophils Relative: 1 %
HEMATOCRIT: 38.7 % (ref 35.0–47.0)
HEMOGLOBIN: 13 g/dL (ref 12.0–16.0)
LYMPHS ABS: 1.4 10*3/uL (ref 1.0–3.6)
Lymphocytes Relative: 11 %
MCH: 29.9 pg (ref 26.0–34.0)
MCHC: 33.6 g/dL (ref 32.0–36.0)
MCV: 89 fL (ref 80.0–100.0)
Monocytes Absolute: 0.7 10*3/uL (ref 0.2–0.9)
Monocytes Relative: 6 %
NEUTROS ABS: 10.1 10*3/uL — AB (ref 1.4–6.5)
NEUTROS PCT: 81 %
Platelets: 320 10*3/uL (ref 150–440)
RBC: 4.35 MIL/uL (ref 3.80–5.20)
RDW: 14 % (ref 11.5–14.5)
WBC: 12.3 10*3/uL — AB (ref 3.6–11.0)

## 2015-12-26 MED ORDER — SODIUM CHLORIDE 0.9 % IV SOLN
80.0000 mg/m2 | Freq: Once | INTRAVENOUS | Status: AC
  Administered 2015-12-26: 140 mg via INTRAVENOUS
  Filled 2015-12-26: qty 7

## 2015-12-26 MED ORDER — SODIUM CHLORIDE 0.9 % IV SOLN
Freq: Once | INTRAVENOUS | Status: AC
  Administered 2015-12-26: 10:00:00 via INTRAVENOUS
  Filled 2015-12-26: qty 1000

## 2015-12-26 MED ORDER — CARBOPLATIN CHEMO INJECTION 600 MG/60ML
532.0000 mg | Freq: Once | INTRAVENOUS | Status: AC
  Administered 2015-12-26: 530 mg via INTRAVENOUS
  Filled 2015-12-26: qty 53

## 2015-12-26 MED ORDER — SODIUM CHLORIDE 0.9 % IV SOLN
10.0000 mg | Freq: Once | INTRAVENOUS | Status: AC
  Administered 2015-12-26: 10 mg via INTRAVENOUS
  Filled 2015-12-26: qty 1

## 2015-12-26 MED ORDER — HEPARIN SOD (PORK) LOCK FLUSH 100 UNIT/ML IV SOLN: 500.0000 [IU] | Freq: Once | INTRAVENOUS | Status: DC | PRN

## 2015-12-26 MED ORDER — PALONOSETRON HCL INJECTION 0.25 MG/5ML
0.2500 mg | Freq: Once | INTRAVENOUS | Status: AC
  Administered 2015-12-26: 0.25 mg via INTRAVENOUS
  Filled 2015-12-26: qty 5

## 2015-12-26 MED ORDER — PROCHLORPERAZINE MALEATE 10 MG PO TABS: 10.0000 mg | ORAL_TABLET | Freq: Four times a day (QID) | ORAL | Status: AC | PRN

## 2015-12-26 NOTE — Progress Notes (Signed)
[place] @ [facility] [phone]  Fax:([phone]) [place]  Oaklie Durrett OB: 01/30/1951  MR#: [id number]  GYI#:[id number]  Patient Care Team: Marden Noble, MD as PCP - General (Internal Medicine)  CHIEF COMPLAINT: Oncology history Chief Complaint  Patient presents with  . Lung Cancer   1.   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 and treat mass adrenal mass.  Diagnosis in January of 2017.  Diagnosis by the right supraclavicular lymph node biopsy.. 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 patient has finished radiation therapy. ( feb of 2017) 3, starting    Chemotherapy  Doug Sou is 6, 2017 with carboplatinum and VP-16 INTERVAL HISTORY:   65 year old female with a history of diabetes mellitus, hyperlipidemia and anxiety presented to emergency room with [facility] with 3-4 days of slurred speech.. Approximately one month ago patient was in [facility] where she was treated for pneumonia. CT scan of the brain@[facility]  revealed multiple hemorrhagic passes.  The right cerebellar hemisphere with some mass effect.  Patient was transferred to [facility] for neurosurgical evaluation. Reevaluation with CT scan of the chest revealed down 3.8 cm left lower lobe mass with multiple bilateral mediastinal  or adenopathy.  Patient was started on steroid.  A biopsy from the left supraclavicular lymph node was positive for small cell undifferentiated carcinoma of lung and patient was referred to be [place] [facility] for possible radiation therapy and chemotherapy. Sent quite emotional at present time.  Accompanied with the husband.  Appetite has been stable.  agent is here for ongoing evaluation and treatment consideration.  No nausea.  No vomiting.  No diarrhea.  Appetite has been stable.  Patient has finished radiation therapy to the brain steroids and  Keppra is being gradually tapered off  Patient has     Number of questions regarding chemotherapy  VISIT DIAGNOSIS:     ICD-9-CM ICD-10-CM   1. Cancer of lower lobe of left lung (HCC) 162.5 C34.32 prochlorperazine (COMPAZINE) 10 MG tablet     CBC with Differential     Comprehensive metabolic panel      No history exists.    Oncology Flowsheet 11/24/2015 11/25/2015 11/25/2015 11/25/2015 11/26/2015 11/26/2015 11/26/2015  ALPRAZolam (XANAX) PO   0.5 mg     0.5 mg      dexamethasone (DECADRON) PO 4 mg 4 mg 4 mg 4 mg 4 mg 4 mg 4 mg     REVIEW OF SYSTEMS:   Gen. status: Patient is quite emotional.  Not in any acute distress. HEENT: Slurred speech.  Headache. Lungs: Increasing shortness of breath patient just quit smoking no hemoptysis or chest pain GI: No nausea no vomiting no diarrhea poor appetite Lower extremity no swelling Skin: No rash Neurological system and slurred speech.  No other focal signs Musculoskeletal system no bony pain GU: No dysuria or hematuria Ha s  Finished  radiation treatment  here to initiate chemotherapy with carboplatinum and VP-16  As per HPI. Otherwise, a complete review of systems is negatve.  PAST MEDICAL HISTORY: Past Medical History  Diagnosis Date  . Diabetes mellitus without complication ([place])   . Cancer of lung ([place]) 12/05/2015    PAST SURGICAL HISTORY: Past Surgical History  Procedure Laterality Date  . Abdominal hysterectomy      FAMILY HISTORYThere is no significant family history of breast cancer, ovarian cancer, colon cancer  History of lung cancer in family     ADVANCED DIRECTIVES:  Patient does not have any living will or healthcare power of attorney.  Information was given .  Available resources had been discussed.  We will follow-up on subsequent appointments regarding this issue  HEALTH MAINTENANCE: Social History  Substance Use Topics  . Smoking status: Former Smoker    Quit date: 11/19/2015  . Smokeless tobacco: Not on file  .  Alcohol Use: No      Allergies  Allergen Reactions  . Amoxicillin Other (See Comments)    Reaction:  Unknown   . Levofloxacin Other (See Comments)    Reaction:  Joint and muscle pain  Pt states that this medication made her unable to walk.     Current Outpatient Prescriptions  Medication Sig Dispense Refill  . ALPRAZolam (XANAX) 0.5 MG tablet Take 0.5 mg by mouth 3 (three) times daily as needed for anxiety.     Marland Kitchen dexamethasone (DECADRON) 4 MG tablet 1 po daily in am with breakfast 30 tablet 3  . glimepiride (AMARYL) 2 MG tablet     . latanoprost (XALATAN) 0.005 % ophthalmic solution Place 1 drop into both eyes at bedtime.    . levETIRAcetam (KEPPRA) 500 MG tablet Take 1 tablet (500 mg total) by mouth 2 (two) times daily. 60 tablet 0  . Omega-3 Fatty Acids (FISH OIL) 1000 MG CAPS Take 2,000 mg by mouth 2 (two) times daily.     Marland Kitchen omeprazole (PRILOSEC) 20 MG capsule Take 20 mg by mouth 2 (two) times daily.     . sitaGLIPtin (JANUVIA) 100 MG tablet Take 100 mg by mouth daily.    Marland Kitchen sulfamethoxazole-trimethoprim (BACTRIM DS,SEPTRA DS) 800-160 MG tablet Take 1 tablet by mouth 2 (two) times daily. 14 tablet 0  . prochlorperazine (COMPAZINE) 10 MG tablet Take 1 tablet (10 mg total) by mouth every 6 (six) hours as needed for nausea or vomiting. 30 tablet 3   No current facility-administered medications for this visit.    OBJECTIVE: PHYSICAL EXAM: General   status: Patient is alert oriented not any acute distress HEENT: No soreness in the mouth. Lymphatic system: Palpable right supraclavicular lymph node small no other lymphadenopathy Lungs: Bilateral wheezing Cardiac: Tachycardia Skin: No rash Lower extremity swelling 1+ Neurological system: Higher functions patient has dysphagia. All other cranial nodes are intact Motor system equal on both sides without any significant loss of power Sensory system no localizing sign Cerebellar ataxia grade 1 Psychiatric system: Anxiety mild  depression All other systems have been reviewed.  Filed Vitals:   12/26/15 0913  BP: 108/72  Pulse: 103  Temp: 98.2 F (36.8 C)     Body mass index is 24.69 kg/(m^2).    ECOG FS:2 - Symptomatic, <50% confined to bed  LAB RESULTS:  Appointment on 12/26/2015  Component Date Value Ref Range Status  . WBC 12/26/2015 12.3* 3.6 - 11.0 K/uL Final  . RBC 12/26/2015 4.35  3.80 - 5.20 MIL/uL Final  . Hemoglobin 12/26/2015 13.0  12.0 - 16.0 g/dL Final  . HCT 12/26/2015 38.7  35.0 - 47.0 % Final  . MCV 12/26/2015 89.0  80.0 - 100.0 fL Final  . MCH 12/26/2015 29.9  26.0 - 34.0 pg Final  . MCHC 12/26/2015 33.6  32.0 - 36.0 g/dL Final  . RDW 12/26/2015 14.0  11.5 - 14.5 % Final  . Platelets 12/26/2015 320  150 - 440 K/uL Final  . Neutrophils Relative % 12/26/2015 81   Final  .  Neutro Abs 12/26/2015 10.1* 1.4 - 6.5 K/uL Final  . Lymphocytes Relative 12/26/2015 11   Final  . Lymphs Abs 12/26/2015 1.4  1.0 - 3.6 K/uL Final  . Monocytes Relative 12/26/2015 6   Final  . Monocytes Absolute 12/26/2015 0.7  0.2 - 0.9 K/uL Final  . Eosinophils Relative 12/26/2015 1   Final  . Eosinophils Absolute 12/26/2015 0.1  0 - 0.7 K/uL Final  . Basophils Relative 12/26/2015 1   Final  . Basophils Absolute 12/26/2015 0.1  0 - 0.1 K/uL Final  . Sodium 12/26/2015 129* 135 - 145 mmol/L Final  . Potassium 12/26/2015 3.5  3.5 - 5.1 mmol/L Final  . Chloride 12/26/2015 94* 101 - 111 mmol/L Final  . CO2 12/26/2015 26  22 - 32 mmol/L Final  . Glucose, Bld 12/26/2015 314* 65 - 99 mg/dL Final  . BUN 12/26/2015 13  6 - 20 mg/dL Final  . Creatinine, Ser 12/26/2015 0.73  0.44 - 1.00 mg/dL Final  . Calcium 12/26/2015 8.9  8.9 - 10.3 mg/dL Final  . Total Protein 12/26/2015 7.1  6.5 - 8.1 g/dL Final  . Albumin 12/26/2015 3.1* 3.5 - 5.0 g/dL Final  . AST 12/26/2015 13* 15 - 41 U/L Final  . ALT 12/26/2015 15  14 - 54 U/L Final  . Alkaline Phosphatase 12/26/2015 76  38 - 126 U/L Final  . Total Bilirubin 12/26/2015 0.8  0.3 -  1.2 mg/dL Final  . GFR calc non Af Amer 12/26/2015 >60  >60 mL/min Final  . GFR calc Af Amer 12/26/2015 >60  >60 mL/min Final   Comment: (NOTE) The eGFR has been calculated using the CKD EPI equation. This calculation has not been validated in all clinical situations. eGFR's persistently <60 mL/min signify possible Chronic Kidney Disease.   . Anion gap 12/26/2015 9  5 - 15 Final  . Magnesium 12/26/2015 1.7  1.7 - 2.4 mg/dL Final     STUDIES: No results found.  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  Both mediastinal lymph node.  Ms. and treatment mass.  Probably metastases to the kidney. Adrenal metastases 2. Has finished radiation therapy to the brain (February, 2017) 3. Starting chemotherapy with carboplatinum and VP-16 All the side effects of chemotherapy including myelosuppression, alopecia, nausea vomiting fatigue weakness.  Secondary infection, and   peripheral neuropathy .  Has been discussed in details. Informal consent has been obtained and will be documented by nurses in the chart Patient was explained all the side effects of chemotherapy.  And informed consent has been obtained We will arrange for the port placement Compazine 10 mg every 4-6 hourly when necessary for nausea Total duration of visit was   30  minutes.  50% or more time was spent in counseling patient and family regarding prognosis and options of treatment and available resources PLAN:  No problem-specific assessment & plan notes found for this encounter.   Patient expressed understanding and was in agreement with this plan. She also understands that She can call clinic at any time with any questions, concerns, or complaints.    Cancer of lower lobe of left lung ([place])   Staging form: Lung, AJCC 7th Edition     Clinical: T2, N3, M1 - Signed by Forest Gleason, MD on 12/05/2015   Forest Gleason, MD   12/26/2015 9:46 AM

## 2015-12-27 ENCOUNTER — Inpatient Hospital Stay: Payer: BLUE CROSS/BLUE SHIELD

## 2015-12-27 DIAGNOSIS — R590 Localized enlarged lymph nodes: Secondary | ICD-10-CM

## 2015-12-27 DIAGNOSIS — C3432 Malignant neoplasm of lower lobe, left bronchus or lung: Secondary | ICD-10-CM

## 2015-12-27 MED ORDER — SODIUM CHLORIDE 0.9 % IV SOLN
Freq: Once | INTRAVENOUS | Status: AC
  Administered 2015-12-27: 15:00:00 via INTRAVENOUS
  Filled 2015-12-27: qty 1000

## 2015-12-27 MED ORDER — SODIUM CHLORIDE 0.9 % IV SOLN
Freq: Once | INTRAVENOUS | Status: AC
  Administered 2015-12-27: 15:00:00 via INTRAVENOUS
  Filled 2015-12-27: qty 4

## 2015-12-27 MED ORDER — SODIUM CHLORIDE 0.9 % IV SOLN
80.0000 mg/m2 | Freq: Once | INTRAVENOUS | Status: AC
  Administered 2015-12-27: 140 mg via INTRAVENOUS
  Filled 2015-12-27: qty 7

## 2015-12-27 MED ORDER — HEPARIN SOD (PORK) LOCK FLUSH 100 UNIT/ML IV SOLN: 500.0000 [IU] | Freq: Once | INTRAVENOUS | Status: DC | PRN

## 2015-12-28 ENCOUNTER — Inpatient Hospital Stay: Payer: BLUE CROSS/BLUE SHIELD

## 2015-12-28 VITALS — BP 99/74 | HR 96 | Temp 98.0°F

## 2015-12-28 DIAGNOSIS — C3432 Malignant neoplasm of lower lobe, left bronchus or lung: Secondary | ICD-10-CM

## 2015-12-28 DIAGNOSIS — R590 Localized enlarged lymph nodes: Secondary | ICD-10-CM

## 2015-12-28 MED ORDER — SODIUM CHLORIDE 0.9 % IV SOLN
Freq: Once | INTRAVENOUS | Status: AC
  Administered 2015-12-28: 14:00:00 via INTRAVENOUS
  Filled 2015-12-28: qty 4

## 2015-12-28 MED ORDER — SODIUM CHLORIDE 0.9 % IV SOLN
Freq: Once | INTRAVENOUS | Status: AC
  Administered 2015-12-28: 14:00:00 via INTRAVENOUS
  Filled 2015-12-28: qty 1000

## 2015-12-28 MED ORDER — SODIUM CHLORIDE 0.9 % IV SOLN
80.0000 mg/m2 | Freq: Once | INTRAVENOUS | Status: AC
  Administered 2015-12-28: 140 mg via INTRAVENOUS
  Filled 2015-12-28: qty 7

## 2015-12-28 MED ORDER — PEGFILGRASTIM 6 MG/0.6ML ~~LOC~~ PSKT
6.0000 mg | PREFILLED_SYRINGE | Freq: Once | SUBCUTANEOUS | Status: AC
  Administered 2015-12-28: 6 mg via SUBCUTANEOUS
  Filled 2015-12-28: qty 0.6

## 2016-01-02 ENCOUNTER — Inpatient Hospital Stay: Payer: BLUE CROSS/BLUE SHIELD

## 2016-01-04 ENCOUNTER — Telehealth: Payer: Self-pay | Admitting: *Deleted

## 2016-01-04 MED ORDER — FLUCONAZOLE 100 MG PO TABS: 100.0000 mg | ORAL_TABLET | Freq: Every day | ORAL | Status: DC

## 2016-01-04 NOTE — Telephone Encounter (Signed)
Called to report that her mouth looks like it is full of pustules and it is painful to even to swallow, eat, or drink

## 2016-01-04 NOTE — Telephone Encounter (Signed)
Diflucan 100 mg daily times five e scribed per VO Dr Oliva Bustard, Pt agrees to appt at [facility] tomorrow to see NP Herring and wa sinformed of rx being sent in for her

## 2016-01-05 ENCOUNTER — Inpatient Hospital Stay (HOSPITAL_BASED_OUTPATIENT_CLINIC_OR_DEPARTMENT_OTHER): Payer: BLUE CROSS/BLUE SHIELD | Admitting: Family Medicine

## 2016-01-05 ENCOUNTER — Telehealth: Payer: Self-pay | Admitting: *Deleted

## 2016-01-05 VITALS — BP 106/73 | HR 99 | Temp 97.7°F | Resp 18 | Wt 143.0 lb

## 2016-01-05 DIAGNOSIS — C7931 Secondary malignant neoplasm of brain: Secondary | ICD-10-CM

## 2016-01-05 DIAGNOSIS — C770 Secondary and unspecified malignant neoplasm of lymph nodes of head, face and neck: Secondary | ICD-10-CM

## 2016-01-05 DIAGNOSIS — Z8701 Personal history of pneumonia (recurrent): Secondary | ICD-10-CM

## 2016-01-05 DIAGNOSIS — K1231 Oral mucositis (ulcerative) due to antineoplastic therapy: Secondary | ICD-10-CM

## 2016-01-05 DIAGNOSIS — Z7689 Persons encountering health services in other specified circumstances: Secondary | ICD-10-CM | POA: Diagnosis not present

## 2016-01-05 DIAGNOSIS — F419 Anxiety disorder, unspecified: Secondary | ICD-10-CM

## 2016-01-05 DIAGNOSIS — Z87891 Personal history of nicotine dependence: Secondary | ICD-10-CM

## 2016-01-05 DIAGNOSIS — C7900 Secondary malignant neoplasm of unspecified kidney and renal pelvis: Secondary | ICD-10-CM

## 2016-01-05 DIAGNOSIS — Z923 Personal history of irradiation: Secondary | ICD-10-CM

## 2016-01-05 DIAGNOSIS — C3432 Malignant neoplasm of lower lobe, left bronchus or lung: Secondary | ICD-10-CM

## 2016-01-05 DIAGNOSIS — E119 Type 2 diabetes mellitus without complications: Secondary | ICD-10-CM

## 2016-01-05 DIAGNOSIS — E785 Hyperlipidemia, unspecified: Secondary | ICD-10-CM

## 2016-01-05 DIAGNOSIS — C7970 Secondary malignant neoplasm of unspecified adrenal gland: Secondary | ICD-10-CM

## 2016-01-05 DIAGNOSIS — Z79899 Other long term (current) drug therapy: Secondary | ICD-10-CM

## 2016-01-05 NOTE — Progress Notes (Signed)
Acute add on visit for sores in her mouth.

## 2016-01-05 NOTE — Telephone Encounter (Signed)
They do not do compounded prescriptions and are unable to fill the Medicated mouthwash for her.They will notify her of this as well. I attempted to call pt but had to leave message on her VM to call me back so that I know where she wants this rx sent

## 2016-01-05 NOTE — Telephone Encounter (Signed)
I called her daughter who [facility]

## 2016-01-05 NOTE — Progress Notes (Signed)
[place]  [phone]  Fax:([phone]     Cynthia Carson DOB: 29-Jun-1951  MR#: [id number]  JSE#:[id number]  Patient Care Team: Marden Noble, MD as PCP - General (Internal Medicine)  CHIEF COMPLAINT:  Chief Complaint  Patient presents with  . Acute Visit    INTERVAL HISTORY: Patient is seen as an acute add on regarding oral mucositis following cycle 1 day 1-3 Carboplatinum and etoposide on February 6-8th. She called our office yesterday and a prescription for oral diflucan 162m for 5 days was sent in. She was able to start this yesterday and reports that she has had a complete resolution of the mouth sores. She has been able to eat and drink normally today. She has a full set of upper and lower dentures and denies any pain or discomfort.   REVIEW OF SYSTEMS:   Review of Systems  Constitutional: Negative for fever, chills, weight loss, malaise/fatigue and diaphoresis.  HENT: Positive for sore throat.        Mouth sores/ mucositis  Eyes: Negative.   Respiratory: Negative for cough, hemoptysis, sputum production, shortness of breath and wheezing.   Cardiovascular: Negative for chest pain, palpitations, orthopnea, claudication, leg swelling and PND.  Gastrointestinal: Negative for heartburn, nausea, vomiting, abdominal pain, diarrhea, constipation, blood in stool and melena.  Genitourinary: Negative.   Musculoskeletal: Negative.   Skin: Negative.   Neurological: Negative for dizziness, tingling, focal weakness, seizures and weakness.  Endo/Heme/Allergies: Does not bruise/bleed easily.  Psychiatric/Behavioral: Negative for depression. The patient is not nervous/anxious and does not have insomnia.     As per HPI. Otherwise, a complete review of systems is negatve.  ONCOLOGY HISTORY: Oncology History   1. 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  Diagnosis in January of 2017.   Diagnosis by the right supraclavicular lymph node biopsy.  T2 N3 M1 stage IV disease with metastases to the brain, kidney, and adrenal gland.  2. Patient has finished radiation therapy. (Feb of 2017)  3. Startingchemotherapy December 26, 2015 with carboplatinum and VP-16.     Cancer of lower lobe of left lung ([place]   12/05/2015 Initial Diagnosis Cancer of lower lobe of left lung ([facility]    PAST MEDICAL HISTORY: Past Medical History  Diagnosis Date  . Diabetes mellitus without complication ([place]   . [place]    PAST SURGICAL HISTORY: Past Surgical History  Procedure Laterality Date  . Abdominal hysterectomy      FAMILY HISTORY No family history on file.  GYNECOLOGIC HISTORY:  No LMP recorded. Patient has had a hysterectomy.     ADVANCED DIRECTIVES:    HEALTH MAINTENANCE: Social History  Substance Use Topics  . Smoking status: Former Smoker    Quit date: 11/19/2015  . Smokeless tobacco: Not on file  . Alcohol Use: No     Colonoscopy:  PAP:  Bone density:  Mammogram:  Allergies  Allergen Reactions  . Amoxicillin Other (See Comments)    Reaction:  Unknown   . Levofloxacin Other (See Comments)    Reaction:  Joint and muscle pain  Pt states that this medication made her unable to walk.     Current Outpatient Prescriptions  Medication Sig Dispense Refill  . ALPRAZolam (XANAX) 0.5 MG tablet Take 0.5 mg by mouth 3 (three) times daily as needed for anxiety.     .Marland Kitchendexamethasone (DECADRON) 4 MG tablet 1 po daily in am  with breakfast 30 tablet 3  . fluconazole (DIFLUCAN) 100 MG tablet Take 1 tablet (100 mg total) by mouth daily. 5 tablet 0  . glimepiride (AMARYL) 2 MG tablet     . latanoprost (XALATAN) 0.005 % ophthalmic solution Place 1 drop into both eyes at bedtime.    . levETIRAcetam (KEPPRA) 500 MG tablet Take 1 tablet (500 mg total) by mouth 2 (two) times daily. 60 tablet 0  . Omega-3 Fatty Acids (FISH  OIL) 1000 MG CAPS Take 2,000 mg by mouth 2 (two) times daily.     Marland Kitchen omeprazole (PRILOSEC) 20 MG capsule Take 20 mg by mouth 2 (two) times daily.     . prochlorperazine (COMPAZINE) 10 MG tablet Take 1 tablet (10 mg total) by mouth every 6 (six) hours as needed for nausea or vomiting. 30 tablet 3  . sitaGLIPtin (JANUVIA) 100 MG tablet Take 100 mg by mouth daily.    Marland Kitchen sulfamethoxazole-trimethoprim (BACTRIM DS,SEPTRA DS) 800-160 MG tablet Take 1 tablet by mouth 2 (two) times daily. 14 tablet 0   No current facility-administered medications for this visit.    OBJECTIVE: BP 106/73 mmHg  Pulse 99  Temp(Src) 97.7 F (36.5 C) (Tympanic)  Resp 18  Wt 143 lb (64.864 kg)   Body mass index is 23.8 kg/(m^2).    ECOG FS:1 - Symptomatic but completely ambulatory  General: Well-developed, well-nourished, no acute distress. HEENT: No signs of oral mucositis. Normocephalic, moist mucous membranes, clear oropharnyx. Musculoskeletal: No edema, cyanosis, or clubbing. Neuro: Alert, answering all questions appropriately. Cranial nerves grossly intact. Skin: No rashes or petechiae noted. Psych: Normal affect. Lymphatics: No cervical, clavicular, or axillary LAD.   LAB RESULTS:  No visits with results within 3 Day(s) from this visit. Latest known visit with results is:  Appointment on 12/26/2015  Component Date Value Ref Range Status  . WBC 12/26/2015 12.3* 3.6 - 11.0 K/uL Final  . RBC 12/26/2015 4.35  3.80 - 5.20 MIL/uL Final  . Hemoglobin 12/26/2015 13.0  12.0 - 16.0 g/dL Final  . HCT 12/26/2015 38.7  35.0 - 47.0 % Final  . MCV 12/26/2015 89.0  80.0 - 100.0 fL Final  . MCH 12/26/2015 29.9  26.0 - 34.0 pg Final  . MCHC 12/26/2015 33.6  32.0 - 36.0 g/dL Final  . RDW 12/26/2015 14.0  11.5 - 14.5 % Final  . Platelets 12/26/2015 320  150 - 440 K/uL Final  . Neutrophils Relative % 12/26/2015 81   Final  . Neutro Abs 12/26/2015 10.1* 1.4 - 6.5 K/uL Final  . Lymphocytes Relative 12/26/2015 11   Final  .  Lymphs Abs 12/26/2015 1.4  1.0 - 3.6 K/uL Final  . Monocytes Relative 12/26/2015 6   Final  . Monocytes Absolute 12/26/2015 0.7  0.2 - 0.9 K/uL Final  . Eosinophils Relative 12/26/2015 1   Final  . Eosinophils Absolute 12/26/2015 0.1  0 - 0.7 K/uL Final  . Basophils Relative 12/26/2015 1   Final  . Basophils Absolute 12/26/2015 0.1  0 - 0.1 K/uL Final  . Sodium 12/26/2015 129* 135 - 145 mmol/L Final  . Potassium 12/26/2015 3.5  3.5 - 5.1 mmol/L Final  . Chloride 12/26/2015 94* 101 - 111 mmol/L Final  . CO2 12/26/2015 26  22 - 32 mmol/L Final  . Glucose, Bld 12/26/2015 314* 65 - 99 mg/dL Final  . BUN 12/26/2015 13  6 - 20 mg/dL Final  . Creatinine, Ser 12/26/2015 0.73  0.44 - 1.00 mg/dL Final  . Calcium 12/26/2015  8.9  8.9 - 10.3 mg/dL Final  . Total Protein 12/26/2015 7.1  6.5 - 8.1 g/dL Final  . Albumin 12/26/2015 3.1* 3.5 - 5.0 g/dL Final  . AST 12/26/2015 13* 15 - 41 U/L Final  . ALT 12/26/2015 15  14 - 54 U/L Final  . Alkaline Phosphatase 12/26/2015 76  38 - 126 U/L Final  . Total Bilirubin 12/26/2015 0.8  0.3 - 1.2 mg/dL Final  . GFR calc non Af Amer 12/26/2015 >60  >60 mL/min Final  . GFR calc Af Amer 12/26/2015 >60  >60 mL/min Final   Comment: (NOTE) The eGFR has been calculated using the CKD EPI equation. This calculation has not been validated in all clinical situations. eGFR's persistently <60 mL/min signify possible Chronic Kidney Disease.   . Anion gap 12/26/2015 9  5 - 15 Final  . Magnesium 12/26/2015 1.7  1.7 - 2.4 mg/dL Final    STUDIES: No results found.  ASSESSMENT:  Carcinoma of lung, Stage IV, T2 N3 M1. Chemotherapy related oral mucositis.  PLAN:   1. Lung cancer. Patient started on cycle 1 day 1 Carboplatinum and Etoposide on February 6th, 2017. Other than oral mucositis patient is tolerating treatment fairly well. Some mild fatigue but has been increasing her activity slowly. Appetite has been stable.  2. Chemo related oral mucositis. Prescription was  sent in yesterday for diflucan 134m x 5 days. She has taken 1 dose and reports that overnight her mouth has completely healed. Advised patient to continue with the remainder of the prescription. She is eating and drinking as usual and denies any further pain. Will send prescription for [facility] magic mouthwash to pharmacy to have in preparation for her next cycle of chemotherapy. Advised patient that we may have to obtain prior authorization through her insurance, she understands and is in agreement.   Cycle 2 day of Carbo and Etoposide is scheduled for 01/16/2016.  Patient expressed understanding and was in agreement with this plan. She also understands that She can call clinic at any time with any questions, concerns, or complaints.   Dr. COliva Bustardwas available for consultation and review of plan of care for this patient.  Cancer of lower lobe of left lung ([facility]   Staging form: Lung, AJCC 7th Edition     Clinical: T2, N3, M1 - Signed by JForest Gleason MD on 12/05/2015   LEvlyn Kanner NP   01/05/2016 3:07 PM

## 2016-01-06 ENCOUNTER — Other Ambulatory Visit: Payer: Self-pay | Admitting: Family Medicine

## 2016-01-06 ENCOUNTER — Telehealth: Payer: Self-pay | Admitting: *Deleted

## 2016-01-06 MED ORDER — FIRST-DUKES MOUTHWASH MT SUSP: 10.0000 mL | Freq: Four times a day (QID) | OROMUCOSAL | Status: DC | PRN

## 2016-01-06 NOTE — Telephone Encounter (Signed)
Requests a letter be faxed to her Husband Viva Gallaher for bringing her to her appt yesterday [phone]. (done) Reports that her insurance will not cover her MMW

## 2016-01-06 NOTE — Telephone Encounter (Signed)
[facility] via [email or web address]

## 2016-01-06 NOTE — Telephone Encounter (Signed)
Response received from [facility] that med does not require PA and to have pharmacy re-process Rx. I have [facility] and she said it is being rejected for an [place] number and is trying to correct it

## 2016-01-09 ENCOUNTER — Inpatient Hospital Stay: Payer: BLUE CROSS/BLUE SHIELD

## 2016-01-10 ENCOUNTER — Inpatient Hospital Stay: Payer: BLUE CROSS/BLUE SHIELD

## 2016-01-10 ENCOUNTER — Telehealth: Payer: Self-pay | Admitting: *Deleted

## 2016-01-10 DIAGNOSIS — C3432 Malignant neoplasm of lower lobe, left bronchus or lung: Secondary | ICD-10-CM

## 2016-01-10 LAB — COMPREHENSIVE METABOLIC PANEL
ALK PHOS: 92 U/L (ref 38–126)
ALT: 16 U/L (ref 14–54)
AST: 19 U/L (ref 15–41)
Albumin: 2.5 g/dL — ABNORMAL LOW (ref 3.5–5.0)
Anion gap: 7 (ref 5–15)
BILIRUBIN TOTAL: 0.6 mg/dL (ref 0.3–1.2)
BUN: 7 mg/dL (ref 6–20)
CALCIUM: 7.8 mg/dL — AB (ref 8.9–10.3)
CO2: 27 mmol/L (ref 22–32)
CREATININE: 0.92 mg/dL (ref 0.44–1.00)
Chloride: 97 mmol/L — ABNORMAL LOW (ref 101–111)
Glucose, Bld: 282 mg/dL — ABNORMAL HIGH (ref 65–99)
Potassium: 2.7 mmol/L — CL (ref 3.5–5.1)
Sodium: 131 mmol/L — ABNORMAL LOW (ref 135–145)
Total Protein: 6.3 g/dL — ABNORMAL LOW (ref 6.5–8.1)

## 2016-01-10 LAB — CBC WITH DIFFERENTIAL/PLATELET
Basophils Absolute: 0.1 10*3/uL (ref 0–0.1)
Basophils Relative: 1 %
EOS PCT: 0 %
Eosinophils Absolute: 0 10*3/uL (ref 0–0.7)
HEMATOCRIT: 31.3 % — AB (ref 35.0–47.0)
HEMOGLOBIN: 10.3 g/dL — AB (ref 12.0–16.0)
LYMPHS ABS: 1.3 10*3/uL (ref 1.0–3.6)
LYMPHS PCT: 10 %
MCH: 28.9 pg (ref 26.0–34.0)
MCHC: 33.1 g/dL (ref 32.0–36.0)
MCV: 87.4 fL (ref 80.0–100.0)
Monocytes Absolute: 1 10*3/uL — ABNORMAL HIGH (ref 0.2–0.9)
Monocytes Relative: 8 %
NEUTROS ABS: 9.9 10*3/uL — AB (ref 1.4–6.5)
NEUTROS PCT: 81 %
Platelets: 129 10*3/uL — ABNORMAL LOW (ref 150–440)
RBC: 3.58 MIL/uL — AB (ref 3.80–5.20)
RDW: 14.1 % (ref 11.5–14.5)
WBC: 12.4 10*3/uL — AB (ref 3.6–11.0)

## 2016-01-10 MED ORDER — POTASSIUM CHLORIDE CRYS ER 20 MEQ PO TBCR: 20.0000 meq | EXTENDED_RELEASE_TABLET | Freq: Every day | ORAL | Status: DC

## 2016-01-10 NOTE — Telephone Encounter (Signed)
Called patient and left message that K+ is critical @ 2.7.  MD has called prescription to pharmacy.  Patient should pick it up this afternoon and start on it today.  One of our schedulers will also be calling patient to let her know when to come to clinic tomorrow for repeat lab draw and possibly receive IV K+ or Magnesium.

## 2016-01-10 NOTE — Telephone Encounter (Signed)
Critical potassium of 2.7. MD aware. Pt will need to start oral potassium. Prescription will be sent to her pharmacy that she needs to start tonight. Will schedule appointment for pt to come in to recheck labs tomorrow morning to possibly receive IV potassium or magnesium. Scheduling to call pt with appt.

## 2016-01-11 ENCOUNTER — Inpatient Hospital Stay: Payer: BLUE CROSS/BLUE SHIELD

## 2016-01-12 ENCOUNTER — Telehealth: Payer: Self-pay | Admitting: *Deleted

## 2016-01-12 NOTE — Telephone Encounter (Signed)
Called asking about an appt she was to have gotten for lab inf. When checking, she missed her appt yesterday for this and nurse stated that they were unable to get her to answer her phone and had left a message for her regarding the appt.He claims she never got the message. I had tried to call her back today after this message and again was unable to get her on the phone or on her husbands number, so I called her daughter who got in touch with her and let me know to call her again. I did and this time she answered. She reports that she has a problem with transportation and asked if our Lucianne Lei could bring her in for this appt. I checked with Zannie Cove who approved her to be added to [facility] tomorrow morning and then I called pt back and let her know that if she does not answer her phone when they call her that they will not come pick her up. She expressed understanding and said she does not get all of her messages, [place] Appt scheduled.

## 2016-01-13 ENCOUNTER — Encounter: Payer: Self-pay | Admitting: *Deleted

## 2016-01-13 ENCOUNTER — Ambulatory Visit
Admission: AD | Admit: 2016-01-13 | Discharge: 2016-01-13 | Disposition: A | Discharge status: Home / Self Care | Payer: BLUE CROSS/BLUE SHIELD | Source: Ambulatory Visit | Attending: Vascular Surgery | Admitting: Vascular Surgery

## 2016-01-13 ENCOUNTER — Inpatient Hospital Stay: Payer: BLUE CROSS/BLUE SHIELD

## 2016-01-13 ENCOUNTER — Encounter
Admission: AD | Disposition: A | Discharge status: Home / Self Care | Payer: Self-pay | Source: Ambulatory Visit | Attending: Vascular Surgery

## 2016-01-13 DIAGNOSIS — C3432 Malignant neoplasm of lower lobe, left bronchus or lung: Secondary | ICD-10-CM

## 2016-01-13 DIAGNOSIS — K219 Gastro-esophageal reflux disease without esophagitis: Secondary | ICD-10-CM | POA: Insufficient documentation

## 2016-01-13 DIAGNOSIS — C3490 Malignant neoplasm of unspecified part of unspecified bronchus or lung: Secondary | ICD-10-CM | POA: Insufficient documentation

## 2016-01-13 DIAGNOSIS — Z79899 Other long term (current) drug therapy: Secondary | ICD-10-CM | POA: Insufficient documentation

## 2016-01-13 DIAGNOSIS — E876 Hypokalemia: Secondary | ICD-10-CM

## 2016-01-13 DIAGNOSIS — F418 Other specified anxiety disorders: Secondary | ICD-10-CM | POA: Diagnosis not present

## 2016-01-13 DIAGNOSIS — E1122 Type 2 diabetes mellitus with diabetic chronic kidney disease: Secondary | ICD-10-CM | POA: Insufficient documentation

## 2016-01-13 HISTORY — PX: PERIPHERAL VASCULAR CATHETERIZATION: SHX172C

## 2016-01-13 LAB — POTASSIUM: POTASSIUM: 3 mmol/L — AB (ref 3.5–5.1)

## 2016-01-13 LAB — CBC WITH DIFFERENTIAL/PLATELET
BASOS PCT: 1 %
Basophils Absolute: 0.1 10*3/uL (ref 0–0.1)
EOS ABS: 0 10*3/uL (ref 0–0.7)
Eosinophils Relative: 0 %
HCT: 28.5 % — ABNORMAL LOW (ref 35.0–47.0)
HEMOGLOBIN: 9.6 g/dL — AB (ref 12.0–16.0)
Lymphocytes Relative: 11 %
Lymphs Abs: 1.3 10*3/uL (ref 1.0–3.6)
MCH: 29.3 pg (ref 26.0–34.0)
MCHC: 33.5 g/dL (ref 32.0–36.0)
MCV: 87.6 fL (ref 80.0–100.0)
MONOS PCT: 9 %
Monocytes Absolute: 1 10*3/uL — ABNORMAL HIGH (ref 0.2–0.9)
NEUTROS PCT: 79 %
Neutro Abs: 9.5 10*3/uL — ABNORMAL HIGH (ref 1.4–6.5)
Platelets: 341 10*3/uL (ref 150–440)
RBC: 3.26 MIL/uL — ABNORMAL LOW (ref 3.80–5.20)
RDW: 14.3 % (ref 11.5–14.5)
WBC: 11.9 10*3/uL — ABNORMAL HIGH (ref 3.6–11.0)

## 2016-01-13 LAB — MAGNESIUM: MAGNESIUM: 1.2 mg/dL — AB (ref 1.7–2.4)

## 2016-01-13 SURGERY — PORTA CATH INSERTION
Anesthesia: Moderate Sedation

## 2016-01-13 MED ORDER — MIDAZOLAM HCL 2 MG/2ML IJ SOLN
INTRAMUSCULAR | Status: AC
  Filled 2016-01-13: qty 2

## 2016-01-13 MED ORDER — SODIUM CHLORIDE 0.9 % IV SOLN: Freq: Once | INTRAVENOUS | Status: DC

## 2016-01-13 MED ORDER — HEPARIN (PORCINE) IN NACL 2-0.9 UNIT/ML-% IJ SOLN
INTRAMUSCULAR | Status: AC
  Filled 2016-01-13: qty 500

## 2016-01-13 MED ORDER — MIDAZOLAM HCL 2 MG/2ML IJ SOLN
INTRAMUSCULAR | Status: DC | PRN
  Administered 2016-01-13: 1 mg via INTRAVENOUS
  Administered 2016-01-13: 2 mg via INTRAVENOUS

## 2016-01-13 MED ORDER — SODIUM CHLORIDE 0.9 % IV SOLN: 4.0000 g | Freq: Once | INTRAVENOUS | Status: DC

## 2016-01-13 MED ORDER — SODIUM CHLORIDE 0.9 % IV SOLN
INTRAVENOUS | Status: DC
  Administered 2016-01-13: 15:00:00 via INTRAVENOUS

## 2016-01-13 MED ORDER — DEXTROSE 5 % IV SOLN
1.5000 g | Freq: Once | INTRAVENOUS | Status: AC
  Administered 2016-01-13: 1.5 g via INTRAVENOUS

## 2016-01-13 MED ORDER — FENTANYL CITRATE (PF) 100 MCG/2ML IJ SOLN
INTRAMUSCULAR | Status: AC
  Filled 2016-01-13: qty 2

## 2016-01-13 MED ORDER — SODIUM CHLORIDE 0.9 % IV SOLN
Freq: Once | INTRAVENOUS | Status: AC
  Administered 2016-01-13: 10:00:00 via INTRAVENOUS
  Filled 2016-01-13: qty 500

## 2016-01-13 MED ORDER — LIDOCAINE-EPINEPHRINE (PF) 1 %-1:200000 IJ SOLN
INTRAMUSCULAR | Status: AC
  Filled 2016-01-13: qty 30

## 2016-01-13 MED ORDER — FENTANYL CITRATE (PF) 100 MCG/2ML IJ SOLN
INTRAMUSCULAR | Status: DC | PRN
  Administered 2016-01-13 (×2): 50 ug via INTRAVENOUS

## 2016-01-13 MED ORDER — SODIUM CHLORIDE 0.9 % IV SOLN: 40.0000 meq | Freq: Once | INTRAVENOUS | Status: DC

## 2016-01-13 SURGICAL SUPPLY — 9 items
BAG DECANTER STRL (MISCELLANEOUS) ×3 IMPLANT
DRAPE INCISE IOBAN 66X45 STRL (DRAPES) ×3 IMPLANT
KIT PORT POWER 8FR ISP CVUE (Catheter) ×3 IMPLANT
PACK ANGIOGRAPHY (CUSTOM PROCEDURE TRAY) ×3 IMPLANT
PREP CHG 10.5 TEAL (MISCELLANEOUS) ×3 IMPLANT
SUT MNCRL AB 4-0 PS2 18 (SUTURE) ×3 IMPLANT
SUT PROLENE 0 CT 1 30 (SUTURE) ×3 IMPLANT
SUTURE VIC 3-0 (SUTURE) ×3 IMPLANT
TOWEL OR 17X26 4PK STRL BLUE (TOWEL DISPOSABLE) ×3 IMPLANT

## 2016-01-13 NOTE — Discharge Instructions (Signed)

## 2016-01-13 NOTE — H&P (Signed)
[facility] History & Physical Update  The patient was interviewed and re-examined.  The patient's previous History and Physical has been reviewed and is unchanged.  There is no change in the plan of care. We plan to proceed with the scheduled procedure.  Schnier, Dolores Lory, MD  01/13/2016, 4:52 PM

## 2016-01-13 NOTE — Op Note (Signed)
OPERATIVE NOTE   PROCEDURE: 1. Placement of a right IJ Infuse-a-Port  PRE-OPERATIVE DIAGNOSIS: Metastatic small cell lung CA  POST-OPERATIVE DIAGNOSIS: same  SURGEON: Katha Cabal M.D.  ANESTHESIA: Conscious sedation combined with 1% lidocaine with epinephrine for 26 minutes  ESTIMATED BLOOD LOSS: Minimal   FINDING(S): 1. Patent vein  SPECIMEN(S): None  INDICATIONS:  Cynthia Carson is a 65 y.o. female who presents with metastatic small cell lung carcinoma. She will be undergoing chemotherapy and therefore requires an Infuse-a-Port. Risks and benefits of been reviewed all questions of been answered alternative therapies were discussed patient agrees to proceed.  DESCRIPTION: After obtaining full informed written consent, the patient was brought back to the special procedure suite and placed in the supine position. The patient's right neck and chest wall are prepped and draped in sterile fashion. Appropriate timeout was called.  Ultrasound is placed in a sterile sleeve, ultrasound is utilized to avoid vascular injury as well as secondary to lack of appropriate landmarks. The right internal jugular vein is identified. It is echolucent and homogeneous as well as easily compressible indicating patency. 1% lidocaine is infiltrated into the soft tissue at the base of the neck as well as on the chest wall.  Under direct ultrasound visualization Seldinger needle is inserted into the right internal jugular vein. J-wire is advanced under fluoroscopic guidance. A small counterincision was created at the wire insertion site. A transverse incision is created 2 fingerbreadths below the scapula and a pocket is fashioned using both blunt and sharp dissection. The pocket is tested for appropriate size with the hub of the Infuse-a-Port. The tunneling device is then used to pull the intravascular portion of the catheter from the pocket to the neck counterincision.  Dilator and peel-away sheath  were then inserted over the wire and the wire is removed. Catheter is then advanced into the venous system without difficulty. Peel-away sheath was then removed.  Catheter is then positioned under fluoroscopic guidance at the atrial caval junction. It is then transected connected to the hub and the hope is slipped into the subcutaneous pocket on the chest wall. The hub was then accessed percutaneously and aspirates easily and flushes well and is flushed with 30 cc of heparinized saline. The pocket incision is then closed in layers using interrupted 3-0 Vicryl for the subcutaneous tissues and 4-0 Monocryl subcuticular for skin closure. Dermabond is applied. The neck counterincision was closed with 4-0 Monocryl subcuticular and Dermabond as well.  The patient tolerated the procedure well and there were no immediate complications.  COMPLICATIONS: None  CONDITION: Unchanged  Katha Cabal M.D. [facility] Office: [phone]   01/13/2016, 5:40 PM

## 2016-01-16 ENCOUNTER — Inpatient Hospital Stay: Payer: BLUE CROSS/BLUE SHIELD

## 2016-01-16 ENCOUNTER — Inpatient Hospital Stay: Payer: BLUE CROSS/BLUE SHIELD | Admitting: Oncology

## 2016-01-17 ENCOUNTER — Ambulatory Visit: Payer: BLUE CROSS/BLUE SHIELD

## 2016-01-17 ENCOUNTER — Encounter: Payer: Self-pay | Admitting: Vascular Surgery

## 2016-01-17 ENCOUNTER — Other Ambulatory Visit: Payer: BLUE CROSS/BLUE SHIELD

## 2016-01-18 ENCOUNTER — Inpatient Hospital Stay: Payer: BLUE CROSS/BLUE SHIELD

## 2016-01-18 ENCOUNTER — Inpatient Hospital Stay: Payer: BLUE CROSS/BLUE SHIELD | Attending: Oncology

## 2016-01-18 ENCOUNTER — Other Ambulatory Visit: Payer: Self-pay | Admitting: *Deleted

## 2016-01-18 ENCOUNTER — Encounter: Payer: Self-pay | Admitting: Oncology

## 2016-01-18 ENCOUNTER — Inpatient Hospital Stay (HOSPITAL_BASED_OUTPATIENT_CLINIC_OR_DEPARTMENT_OTHER): Payer: BLUE CROSS/BLUE SHIELD | Admitting: Oncology

## 2016-01-18 ENCOUNTER — Ambulatory Visit: Payer: BLUE CROSS/BLUE SHIELD

## 2016-01-18 VITALS — BP 98/71 | HR 104 | Temp 97.1°F | Resp 18 | Wt 145.2 lb

## 2016-01-18 DIAGNOSIS — C7931 Secondary malignant neoplasm of brain: Secondary | ICD-10-CM

## 2016-01-18 DIAGNOSIS — B370 Candidal stomatitis: Secondary | ICD-10-CM | POA: Insufficient documentation

## 2016-01-18 DIAGNOSIS — I959 Hypotension, unspecified: Secondary | ICD-10-CM | POA: Insufficient documentation

## 2016-01-18 DIAGNOSIS — C7900 Secondary malignant neoplasm of unspecified kidney and renal pelvis: Secondary | ICD-10-CM

## 2016-01-18 DIAGNOSIS — Z79899 Other long term (current) drug therapy: Secondary | ICD-10-CM | POA: Diagnosis not present

## 2016-01-18 DIAGNOSIS — Z801 Family history of malignant neoplasm of trachea, bronchus and lung: Secondary | ICD-10-CM | POA: Diagnosis not present

## 2016-01-18 DIAGNOSIS — C3432 Malignant neoplasm of lower lobe, left bronchus or lung: Secondary | ICD-10-CM | POA: Diagnosis present

## 2016-01-18 DIAGNOSIS — F17210 Nicotine dependence, cigarettes, uncomplicated: Secondary | ICD-10-CM

## 2016-01-18 DIAGNOSIS — Z5111 Encounter for antineoplastic chemotherapy: Secondary | ICD-10-CM | POA: Diagnosis not present

## 2016-01-18 DIAGNOSIS — E119 Type 2 diabetes mellitus without complications: Secondary | ICD-10-CM | POA: Diagnosis not present

## 2016-01-18 DIAGNOSIS — Z923 Personal history of irradiation: Secondary | ICD-10-CM

## 2016-01-18 DIAGNOSIS — E876 Hypokalemia: Secondary | ICD-10-CM | POA: Diagnosis not present

## 2016-01-18 DIAGNOSIS — C7970 Secondary malignant neoplasm of unspecified adrenal gland: Secondary | ICD-10-CM | POA: Insufficient documentation

## 2016-01-18 DIAGNOSIS — R590 Localized enlarged lymph nodes: Secondary | ICD-10-CM

## 2016-01-18 DIAGNOSIS — Z7689 Persons encountering health services in other specified circumstances: Secondary | ICD-10-CM | POA: Diagnosis not present

## 2016-01-18 LAB — CBC WITH DIFFERENTIAL/PLATELET
Basophils Absolute: 0.1 10*3/uL (ref 0–0.1)
Basophils Relative: 1 %
EOS ABS: 0 10*3/uL (ref 0–0.7)
Eosinophils Relative: 0 %
HEMATOCRIT: 29.4 % — AB (ref 35.0–47.0)
HEMOGLOBIN: 9.9 g/dL — AB (ref 12.0–16.0)
LYMPHS ABS: 2.1 10*3/uL (ref 1.0–3.6)
LYMPHS PCT: 20 %
MCH: 29.3 pg (ref 26.0–34.0)
MCHC: 33.6 g/dL (ref 32.0–36.0)
MCV: 87.4 fL (ref 80.0–100.0)
MONOS PCT: 12 %
Monocytes Absolute: 1.2 10*3/uL — ABNORMAL HIGH (ref 0.2–0.9)
NEUTROS ABS: 7 10*3/uL — AB (ref 1.4–6.5)
NEUTROS PCT: 67 %
Platelets: 705 10*3/uL — ABNORMAL HIGH (ref 150–440)
RBC: 3.36 MIL/uL — AB (ref 3.80–5.20)
RDW: 15 % — ABNORMAL HIGH (ref 11.5–14.5)
WBC: 10.4 10*3/uL (ref 3.6–11.0)

## 2016-01-18 LAB — COMPREHENSIVE METABOLIC PANEL
ALK PHOS: 70 U/L (ref 38–126)
ALT: 18 U/L (ref 14–54)
AST: 18 U/L (ref 15–41)
Albumin: 2.5 g/dL — ABNORMAL LOW (ref 3.5–5.0)
Anion gap: 9 (ref 5–15)
BILIRUBIN TOTAL: 0.4 mg/dL (ref 0.3–1.2)
BUN: <5 mg/dL — ABNORMAL LOW (ref 6–20)
CALCIUM: 8 mg/dL — AB (ref 8.9–10.3)
CO2: 24 mmol/L (ref 22–32)
Chloride: 98 mmol/L — ABNORMAL LOW (ref 101–111)
Creatinine, Ser: 0.76 mg/dL (ref 0.44–1.00)
GFR calc non Af Amer: >60 mL/min (ref >60)
GLUCOSE: 274 mg/dL — AB (ref 65–99)
Potassium: 3.3 mmol/L — ABNORMAL LOW (ref 3.5–5.1)
SODIUM: 131 mmol/L — AB (ref 135–145)
Total Protein: 6.9 g/dL (ref 6.5–8.1)

## 2016-01-18 LAB — MAGNESIUM: Magnesium: 1.1 mg/dL — ABNORMAL LOW (ref 1.7–2.4)

## 2016-01-18 MED ORDER — SODIUM CHLORIDE 0.9 % IV SOLN
10.0000 mg | Freq: Once | INTRAVENOUS | Status: AC
  Administered 2016-01-18: 10 mg via INTRAVENOUS
  Filled 2016-01-18: qty 1

## 2016-01-18 MED ORDER — SODIUM CHLORIDE 0.9 % IV SOLN
Freq: Once | INTRAVENOUS | Status: AC
  Administered 2016-01-18: 10:00:00 via INTRAVENOUS
  Filled 2016-01-18: qty 1000

## 2016-01-18 MED ORDER — SODIUM CHLORIDE 0.9 % IV SOLN
532.0000 mg | Freq: Once | INTRAVENOUS | Status: AC
  Administered 2016-01-18: 530 mg via INTRAVENOUS
  Filled 2016-01-18: qty 53

## 2016-01-18 MED ORDER — SODIUM CHLORIDE 0.9 % IJ SOLN
10.0000 mL | INTRAMUSCULAR | Status: DC | PRN
  Administered 2016-01-18: 10 mL
  Filled 2016-01-18: qty 10

## 2016-01-18 MED ORDER — HEPARIN SOD (PORK) LOCK FLUSH 100 UNIT/ML IV SOLN
500.0000 [IU] | Freq: Once | INTRAVENOUS | Status: AC | PRN
  Administered 2016-01-18: 500 [IU]
  Filled 2016-01-18: qty 5

## 2016-01-18 MED ORDER — MAGNESIUM SULFATE 50 % IJ SOLN
Freq: Once | INTRAMUSCULAR | Status: AC
Start: 2016-01-18 — End: 2016-01-18
  Administered 2016-01-18: 10:00:00 via INTRAVENOUS
  Filled 2016-01-18: qty 500

## 2016-01-18 MED ORDER — SODIUM CHLORIDE 0.9 % IV SOLN
80.0000 mg/m2 | Freq: Once | INTRAVENOUS | Status: AC
  Administered 2016-01-18: 140 mg via INTRAVENOUS
  Filled 2016-01-18: qty 7

## 2016-01-18 MED ORDER — LIDOCAINE-PRILOCAINE 2.5-2.5 % EX CREA
1.0000 | TOPICAL_CREAM | CUTANEOUS | Status: DC | PRN
Start: 2016-01-18 — End: 2016-10-19

## 2016-01-18 MED ORDER — PALONOSETRON HCL INJECTION 0.25 MG/5ML
0.2500 mg | Freq: Once | INTRAVENOUS | Status: AC
  Administered 2016-01-18: 0.25 mg via INTRAVENOUS
  Filled 2016-01-18: qty 5

## 2016-01-18 NOTE — Telephone Encounter (Signed)
Does not have any diflucan and no refills on bottle

## 2016-01-18 NOTE — Progress Notes (Signed)
Patient had an episode of diarrhea early this morning.  BP 98/71  HR 104.  Requesting rx for EMLA.

## 2016-01-18 NOTE — Progress Notes (Signed)
[place] @ [facility] [phone]  [phone]  Kendi Defalco OB: [birth year]  MR#: [id number]  JZP#:[id number]  Patient Care Team: Marden Noble, MD as PCP - General (Internal Medicine)  CHIEF COMPLAINT: Oncology history Chief Complaint  Patient presents with  . Lung Cancer   1.   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 and treat mass adrenal mass.  Diagnosis in January of 2017.  Diagnosis by the right supraclavicular lymph node biopsy.. 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 patient has finished radiation therapy. ( feb of 2017) 3, starting    Chemotherapy    Feb  6 th ,, 2017 with carboplatinum and VP-16 INTERVAL HISTORY:  65 year old lady came today for second cycle of chemotherapy. Appetite has been poor.  Patient had developed a fungal infection (candidiasis of mouth) and started on Diflucan.  Which helped. Patient required intravenous fluid.  Had 1 episode of diarrhea.  Here for continuation of second cycle of chemotherapy Patient had hypokalemia and hypomagnesemia  Patient has     Number of questions regarding chemotherapy  VISIT DIAGNOSIS:   No diagnosis found.   Oncology History   1. 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 Diagnosis in January of 2017.   Diagnosis by the right supraclavicular lymph node biopsy.  T2 N3 M1 stage IV disease with metastases to the brain, kidney, and adrenal gland.  2. Patient has finished radiation therapy. (Feb of 2017)  3. Startingchemotherapy December 26, 2015 with carboplatinum and VP-16.     Cancer of lower lobe of left lung ([place])   12/05/2015 Initial Diagnosis Cancer of lower lobe of left lung [facility])    Oncology Flowsheet 11/25/2015 11/26/2015 11/26/2015 11/26/2015 12/26/2015  12/27/2015 12/28/2015  Day, Cycle - - - - Day 1, Cycle 1 Day 2, Cycle 1 Day 3, Cycle 1  ALPRAZolam (XANAX) PO   0.5 mg     - - -  CARBOplatin (PARAPLATIN) IV - - - - 530 mg - -  dexamethasone (DECADRON) IV - - - - 10 mg - -  dexamethasone (DECADRON) PO 4 mg 4 mg 4 mg 4 mg - - -  etoposide (VEPESID) IV - - - - 80 mg/m2 80 mg/m2 80 mg/m2  ondansetron (ZOFRAN) IV - - - - - [ 8 mg ] [ 8 mg ]  palonosetron (ALOXI) IV - - - - 0.25 mg - -  pegfilgrastim (NEULASTA ONPRO KIT) [place] - - - - - - 6 mg     REVIEW OF SYSTEMS:   Gen. status: Patient is quite emotional.  Not in any acute distress. HEENT: Slurred speech.  Headache. Lungs: Increasing shortness of breath patient just quit smoking no hemoptysis or chest pain GI: No nausea no vomiting no diarrhea poor appetite Lower extremity no swelling Skin: No rash Neurological system and slurred speech.  No other focal signs Musculoskeletal system no bony pain GU: No dysuria or hematuria Ha s  Finished  radiation treatment  here to initiate chemotherapy with carboplatinum and VP-16  As per HPI. Otherwise, a complete review of systems is negatve.  PAST MEDICAL HISTORY: Past Medical History  Diagnosis Date  . Diabetes mellitus without complication ([place])   . Cancer of lung ([place]) 12/05/2015    PAST SURGICAL HISTORY: Past Surgical History  Procedure Laterality Date  . Abdominal hysterectomy    . Peripheral vascular catheterization N/A 01/13/2016    Procedure: Glori Luis Cath Insertion;  Surgeon: Katha Cabal, MD;  Location: [place] [facility];  Service: Cardiovascular;  Laterality: N/A;    FAMILY HISTORYThere is no significant family history of breast cancer, ovarian cancer, colon cancer  History of lung cancer in family     ADVANCED DIRECTIVES:  Patient does not have any living will or healthcare power of attorney.  Information was given .  Available resources had been discussed.  We will follow-up on subsequent appointments regarding this  issue  HEALTH MAINTENANCE: Social History  Substance Use Topics  . Smoking status: Former Smoker -- 1.00 packs/day for 49 years    Types: Cigarettes    Quit date: 11/19/2011  . Smokeless tobacco: None  . Alcohol Use: No      Allergies  Allergen Reactions  . Amoxicillin Other (See Comments)    Reaction:  Unknown   . Levofloxacin Other (See Comments)    Reaction:  Joint and muscle pain  Pt states that this medication made her unable to walk.     Current Outpatient Prescriptions  Medication Sig Dispense Refill  . ALPRAZolam (XANAX) 0.5 MG tablet Take 0.5 mg by mouth 3 (three) times daily as needed for anxiety.     Marland Kitchen dexamethasone (DECADRON) 4 MG tablet 1 po daily in am with breakfast 30 tablet 3  . Diphenhyd-Hydrocort-Nystatin (FIRST-DUKES MOUTHWASH) SUSP Use as directed 10 mLs in the mouth or throat 4 (four) times daily as needed. 240 mL 2  . fluconazole (DIFLUCAN) 100 MG tablet Take 1 tablet (100 mg total) by mouth daily. 5 tablet 0  . glimepiride (AMARYL) 2 MG tablet     . latanoprost (XALATAN) 0.005 % ophthalmic solution Place 1 drop into both eyes at bedtime.    . levETIRAcetam (KEPPRA) 500 MG tablet Take 1 tablet (500 mg total) by mouth 2 (two) times daily. 60 tablet 0  . Omega-3 Fatty Acids (FISH OIL) 1000 MG CAPS Take 2,000 mg by mouth 2 (two) times daily.     Marland Kitchen omeprazole (PRILOSEC) 20 MG capsule Take 20 mg by mouth 2 (two) times daily.     . potassium chloride SA (K-DUR,KLOR-CON) 20 MEQ tablet Take 1 tablet (20 mEq total) by mouth daily. 30 tablet 3  . prochlorperazine (COMPAZINE) 10 MG tablet Take 1 tablet (10 mg total) by mouth every 6 (six) hours as needed for nausea or vomiting. 30 tablet 3  . sitaGLIPtin (JANUVIA) 100 MG tablet Take 100 mg by mouth daily.    Marland Kitchen sulfamethoxazole-trimethoprim (BACTRIM DS,SEPTRA DS) 800-160 MG tablet Take 1 tablet by mouth 2 (two) times daily. 14 tablet 0  . nystatin (MYCOSTATIN) 100000 UNIT/ML suspension      No current  facility-administered medications for this visit.    OBJECTIVE: PHYSICAL EXAM: General   status: Patient is alert oriented not any acute distress HEENT: No soreness in the mouth.  No evidence of candidiasis Lymphatic system: Palpable right supraclavicular lymph node small no other lymphadenopathy Lungs: Bilateral wheezing Cardiac: Tachycardia Skin: No rash Lower extremity swelling has improved Neurological system: Higher functions patient has dysphagia. All other cranial nodes are intact Motor system equal on both sides without any significant loss of power Sensory system no localizing sign Cerebellar ataxia grade 1, improving Psychiatric system: Anxiety mild depression All other systems have been reviewed.  Filed Vitals:   01/18/16 0905  BP: 98/71  Pulse: 104  Temp: 97.1 F (36.2 C)  Resp: 18     Body mass index is 24.16 kg/(m^2).    ECOG FS:2 - Symptomatic, <50% confined to bed  LAB RESULTS:  Appointment on 01/18/2016  Component Date Value Ref Range Status  . WBC 01/18/2016 10.4  3.6 - 11.0 K/uL Final  . RBC 01/18/2016 3.36* 3.80 - 5.20 MIL/uL Final  . Hemoglobin 01/18/2016 9.9* 12.0 - 16.0 g/dL Final  . HCT 01/18/2016 29.4* 35.0 - 47.0 % Final  . MCV 01/18/2016 87.4  80.0 - 100.0 fL Final  . MCH 01/18/2016 29.3  26.0 - 34.0 pg Final  . MCHC 01/18/2016 33.6  32.0 - 36.0 g/dL Final  . RDW 01/18/2016 15.0* 11.5 - 14.5 % Final  . Platelets 01/18/2016 705* 150 - 440 K/uL Final  . Neutrophils Relative % 01/18/2016 67   Final  . Neutro Abs 01/18/2016 7.0* 1.4 - 6.5 K/uL Final  . Lymphocytes Relative 01/18/2016 20   Final  . Lymphs Abs 01/18/2016 2.1  1.0 - 3.6 K/uL Final  . Monocytes Relative 01/18/2016 12   Final  . Monocytes Absolute 01/18/2016 1.2* 0.2 - 0.9 K/uL Final  . Eosinophils Relative 01/18/2016 0   Final  . Eosinophils Absolute 01/18/2016 0.0  0 - 0.7 K/uL Final  . Basophils Relative 01/18/2016 1   Final  . Basophils Absolute 01/18/2016 0.1  0 - 0.1 K/uL  Final  . Sodium 01/18/2016 131* 135 - 145 mmol/L Final  . Potassium 01/18/2016 3.3* 3.5 - 5.1 mmol/L Final  . Chloride 01/18/2016 98* 101 - 111 mmol/L Final  . CO2 01/18/2016 24  22 - 32 mmol/L Final  . Glucose, Bld 01/18/2016 274* 65 - 99 mg/dL Final  . BUN 01/18/2016 <5* 6 - 20 mg/dL Final  . Creatinine, Ser 01/18/2016 0.76  0.44 - 1.00 mg/dL Final  . Calcium 01/18/2016 8.0* 8.9 - 10.3 mg/dL Final  . Total Protein 01/18/2016 6.9  6.5 - 8.1 g/dL Final  . Albumin 01/18/2016 2.5* 3.5 - 5.0 g/dL Final  . AST 01/18/2016 18  15 - 41 U/L Final  . ALT 01/18/2016 18  14 - 54 U/L Final  . Alkaline Phosphatase 01/18/2016 70  38 - 126 U/L Final  . Total Bilirubin 01/18/2016 0.4  0.3 - 1.2 mg/dL Final  . GFR calc non Af Amer 01/18/2016 >60  >60 mL/min Final  . GFR calc Af Amer 01/18/2016 >60  >60 mL/min Final   Comment: (NOTE) The eGFR has been calculated using the CKD EPI equation. This calculation has not been validated in all clinical situations. eGFR's persistently <60 mL/min signify possible Chronic Kidney Disease.   . Anion gap 01/18/2016 9  5 - 15 Final  . Magnesium 01/18/2016 1.1* 1.7 - 2.4 mg/dL Final     STUDIES: No results found.  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  Both mediastinal lymph node.  Ms. and treatment mass.  Probably metastases to the kidney. Adrenal metastases 2. Has finished radiation therapy to the brain (February, 2017) 3. Starting chemotherapy with carboplatinum and VP-16 In interim.  Patient had developed candidiasis of mouth responded well to Diflucan  2.  Hypokalemia hypomagnesemia We will start patient on IV 500 cc of fluid because of slight hypotension as well as Q4 grams of IV magnesium and 20 mEq of potassium.  Magnesium and potassium will be checked periodically once a week and supplements be given.  3 total 4 cycles  of chemotherapy has been planned  4.  Patient had  number of questions which were explained and answered. Total duration of visit was 35   minutes.  50% or more time was spent in counseling patient and family regarding prognosis and options of treatment and available resources  Patient expressed understanding and was in agreement with this plan. She also understands that She can call clinic at any time with any questions, concerns, or complaints.    Cancer of lower lobe of left lung ([place])   Staging form: Lung, AJCC 7th Edition     Clinical: T2, N3, M1 - Signed by Forest Gleason, MD on 12/05/2015   Forest Gleason, MD   01/18/2016 9:13 AM

## 2016-01-19 ENCOUNTER — Inpatient Hospital Stay: Payer: BLUE CROSS/BLUE SHIELD

## 2016-01-19 VITALS — BP 120/70 | HR 78 | Temp 96.1°F | Resp 18

## 2016-01-19 DIAGNOSIS — C3432 Malignant neoplasm of lower lobe, left bronchus or lung: Secondary | ICD-10-CM

## 2016-01-19 DIAGNOSIS — R590 Localized enlarged lymph nodes: Secondary | ICD-10-CM

## 2016-01-19 MED ORDER — SODIUM CHLORIDE 0.9 % IV SOLN
80.0000 mg/m2 | Freq: Once | INTRAVENOUS | Status: AC
  Administered 2016-01-19: 140 mg via INTRAVENOUS
  Filled 2016-01-19: qty 7

## 2016-01-19 MED ORDER — HEPARIN SOD (PORK) LOCK FLUSH 100 UNIT/ML IV SOLN
500.0000 [IU] | Freq: Once | INTRAVENOUS | Status: AC | PRN
  Administered 2016-01-19: 500 [IU]

## 2016-01-19 MED ORDER — SODIUM CHLORIDE 0.9 % IV SOLN
Freq: Once | INTRAVENOUS | Status: AC
  Administered 2016-01-19: 14:00:00 via INTRAVENOUS
  Filled 2016-01-19: qty 1000

## 2016-01-19 MED ORDER — SODIUM CHLORIDE 0.9 % IJ SOLN
10.0000 mL | INTRAMUSCULAR | Status: DC | PRN
  Administered 2016-01-19: 10 mL
  Filled 2016-01-19: qty 10

## 2016-01-19 MED ORDER — SODIUM CHLORIDE 0.9 % IV SOLN
Freq: Once | INTRAVENOUS | Status: AC
  Administered 2016-01-19: 14:00:00 via INTRAVENOUS
  Filled 2016-01-19: qty 4

## 2016-01-19 MED ORDER — FLUCONAZOLE 100 MG PO TABS: 100.0000 mg | ORAL_TABLET | Freq: Every day | ORAL | Status: DC

## 2016-01-19 MED ORDER — HEPARIN SOD (PORK) LOCK FLUSH 100 UNIT/ML IV SOLN
INTRAVENOUS | Status: AC
  Filled 2016-01-19: qty 5

## 2016-01-20 ENCOUNTER — Inpatient Hospital Stay: Payer: BLUE CROSS/BLUE SHIELD

## 2016-01-20 VITALS — BP 117/80 | HR 93 | Temp 97.0°F | Resp 18

## 2016-01-20 DIAGNOSIS — C3432 Malignant neoplasm of lower lobe, left bronchus or lung: Secondary | ICD-10-CM | POA: Diagnosis not present

## 2016-01-20 DIAGNOSIS — R590 Localized enlarged lymph nodes: Secondary | ICD-10-CM

## 2016-01-20 MED ORDER — HEPARIN SOD (PORK) LOCK FLUSH 100 UNIT/ML IV SOLN
500.0000 [IU] | Freq: Once | INTRAVENOUS | Status: AC
  Administered 2016-01-20: 500 [IU] via INTRAVENOUS

## 2016-01-20 MED ORDER — HEPARIN SOD (PORK) LOCK FLUSH 100 UNIT/ML IV SOLN
INTRAVENOUS | Status: AC
  Filled 2016-01-20: qty 5

## 2016-01-20 MED ORDER — SODIUM CHLORIDE 0.9 % IV SOLN
80.0000 mg/m2 | Freq: Once | INTRAVENOUS | Status: AC
  Administered 2016-01-20: 140 mg via INTRAVENOUS
  Filled 2016-01-20: qty 7

## 2016-01-20 MED ORDER — SODIUM CHLORIDE 0.9 % IV SOLN
Freq: Once | INTRAVENOUS | Status: AC
  Administered 2016-01-20: 14:00:00 via INTRAVENOUS
  Filled 2016-01-20: qty 1000

## 2016-01-20 MED ORDER — SODIUM CHLORIDE 0.9 % IV SOLN
Freq: Once | INTRAVENOUS | Status: AC
  Administered 2016-01-20: 14:00:00 via INTRAVENOUS
  Filled 2016-01-20: qty 4

## 2016-01-20 MED ORDER — PEGFILGRASTIM 6 MG/0.6ML ~~LOC~~ PSKT
6.0000 mg | PREFILLED_SYRINGE | Freq: Once | SUBCUTANEOUS | Status: AC
  Administered 2016-01-20: 6 mg via SUBCUTANEOUS
  Filled 2016-01-20: qty 0.6

## 2016-01-25 ENCOUNTER — Inpatient Hospital Stay: Payer: BLUE CROSS/BLUE SHIELD

## 2016-01-25 ENCOUNTER — Ambulatory Visit
Admission: RE | Admit: 2016-01-25 | Discharge: 2016-01-25 | Disposition: A | Discharge status: Home / Self Care | Payer: BLUE CROSS/BLUE SHIELD | Source: Ambulatory Visit | Attending: Radiation Oncology | Admitting: Radiation Oncology

## 2016-01-25 ENCOUNTER — Encounter: Payer: Self-pay | Admitting: Radiation Oncology

## 2016-01-25 VITALS — BP 122/73 | HR 98 | Temp 96.6°F | Resp 18

## 2016-01-25 DIAGNOSIS — C7931 Secondary malignant neoplasm of brain: Secondary | ICD-10-CM

## 2016-01-25 DIAGNOSIS — C3432 Malignant neoplasm of lower lobe, left bronchus or lung: Secondary | ICD-10-CM | POA: Diagnosis not present

## 2016-01-25 LAB — CBC WITH DIFFERENTIAL/PLATELET
Basophils Absolute: 0.2 10*3/uL — ABNORMAL HIGH (ref 0–0.1)
Basophils Relative: 1 %
EOS PCT: 1 %
Eosinophils Absolute: 0.2 10*3/uL (ref 0–0.7)
HEMATOCRIT: 28 % — AB (ref 35.0–47.0)
HEMOGLOBIN: 9.4 g/dL — AB (ref 12.0–16.0)
LYMPHS ABS: 1.9 10*3/uL (ref 1.0–3.6)
Lymphocytes Relative: 11 %
MCH: 29.2 pg (ref 26.0–34.0)
MCHC: 33.5 g/dL (ref 32.0–36.0)
MCV: 87.2 fL (ref 80.0–100.0)
Monocytes Absolute: 0.3 10*3/uL (ref 0.2–0.9)
Monocytes Relative: 2 %
NEUTROS PCT: 85 %
Neutro Abs: 14.8 10*3/uL — ABNORMAL HIGH (ref 1.4–6.5)
Platelets: 316 10*3/uL (ref 150–440)
RBC: 3.21 MIL/uL — AB (ref 3.80–5.20)
RDW: 14.8 % — AB (ref 11.5–14.5)
WBC: 17.4 10*3/uL — AB (ref 3.6–11.0)

## 2016-01-25 LAB — MAGNESIUM: Magnesium: 1.6 mg/dL — ABNORMAL LOW (ref 1.7–2.4)

## 2016-01-25 LAB — POTASSIUM: Potassium: 3.5 mmol/L (ref 3.5–5.1)

## 2016-01-25 MED ORDER — SODIUM CHLORIDE 0.9% FLUSH
10.0000 mL | INTRAVENOUS | Status: DC | PRN
  Administered 2016-01-25: 10 mL via INTRAVENOUS
  Filled 2016-01-25: qty 10

## 2016-01-25 MED ORDER — SODIUM CHLORIDE 0.9 % IV SOLN
Freq: Once | INTRAVENOUS | Status: AC
  Administered 2016-01-25: 12:00:00 via INTRAVENOUS
  Filled 2016-01-25: qty 500

## 2016-01-25 MED ORDER — HEPARIN SOD (PORK) LOCK FLUSH 100 UNIT/ML IV SOLN
500.0000 [IU] | Freq: Once | INTRAVENOUS | Status: AC
  Administered 2016-01-25: 500 [IU] via INTRAVENOUS
  Filled 2016-01-25: qty 5

## 2016-01-25 NOTE — Progress Notes (Signed)
Radiation Oncology Follow up Note  Name: Cynthia Carson   Date:   01/25/2016 MRN:  [id number] DOB: [birth year]    This 65 y.o. female presents to the clinic today for follow-up status post whole brain radiation for small cell lung cancer stage IV disease.  REFERRING PROVIDER: Kelvin Cellar, MD  HPI: Patient is a 65 year old female now 1 month out having completed whole brain radiation therapy for extensive stage small cell lung cancer with bulky chest disease as well as brain kidney and adrenal metastasis. She's currently on carboplatinum VP-16 chemotherapy she is doing well she's been off steroids now. She's having note significant change in neurologic status. Her speech has returned somewhat to normal she continues to be quite fatigued and has on and off days which we would expect..  COMPLICATIONS OF TREATMENT: none  FOLLOW UP COMPLIANCE: keeps appointments   PHYSICAL EXAM:  BP 122/73 mmHg  Pulse 98  Temp(Src) 96.6 F (35.9 C)  Resp 30  Wt  Wheelchair-bound female in NAD. Cranial nerves II through XII are grossly intact motor sensory and DTR levels appear equal and symmetric in the upper lower extremities crude visual fields within normal range proprioception is intact. Her speech still is somewhat compromised although much improved over initial evaluation. Well-developed well-nourished patient in NAD. HEENT reveals PERLA, EOMI, discs not visualized.  Oral cavity is clear. No oral mucosal lesions are identified. Neck is clear without evidence of cervical or supraclavicular adenopathy. Lungs are clear to A&P. Cardiac examination is essentially unremarkable with regular rate and rhythm without murmur rub or thrill. Abdomen is benign with no organomegaly or masses noted. Motor sensory and DTR levels are equal and symmetric in the upper and lower extremities. Cranial nerves II through XII are grossly intact. Proprioception is intact. No peripheral adenopathy or edema is identified. No motor or  sensory levels are noted. Crude visual fields are within normal range.  RADIOLOGY RESULTS: No current films for review  PLAN: Present time she is doing well from a neurologic standpoint. I'm going to turn follow-up care over to medical oncology. I would be happy to reevaluate the patient should any palliative treatment need to be indicated including treatment to her chest prevent lung atelectasis.  I would like to take this opportunity for allowing me to participate in the care of your patient.Armstead Peaks., MD

## 2016-02-01 ENCOUNTER — Inpatient Hospital Stay: Payer: BLUE CROSS/BLUE SHIELD

## 2016-02-01 ENCOUNTER — Ambulatory Visit: Payer: BLUE CROSS/BLUE SHIELD

## 2016-02-01 VITALS — BP 109/73 | HR 71 | Temp 97.0°F | Resp 18

## 2016-02-01 DIAGNOSIS — C3432 Malignant neoplasm of lower lobe, left bronchus or lung: Secondary | ICD-10-CM | POA: Diagnosis not present

## 2016-02-01 LAB — COMPREHENSIVE METABOLIC PANEL
ALBUMIN: 3.3 g/dL — AB (ref 3.5–5.0)
ALK PHOS: 90 U/L (ref 38–126)
ALT: 37 U/L (ref 14–54)
AST: 53 U/L — AB (ref 15–41)
Anion gap: 9 (ref 5–15)
CALCIUM: 8.5 mg/dL — AB (ref 8.9–10.3)
CHLORIDE: 98 mmol/L — AB (ref 101–111)
CO2: 24 mmol/L (ref 22–32)
CREATININE: 0.78 mg/dL (ref 0.44–1.00)
GFR calc non Af Amer: >60 mL/min (ref >60)
GLUCOSE: 154 mg/dL — AB (ref 65–99)
Potassium: 3.2 mmol/L — ABNORMAL LOW (ref 3.5–5.1)
SODIUM: 131 mmol/L — AB (ref 135–145)
Total Bilirubin: 0.5 mg/dL (ref 0.3–1.2)
Total Protein: 6.9 g/dL (ref 6.5–8.1)

## 2016-02-01 LAB — CBC WITH DIFFERENTIAL/PLATELET
BASOS PCT: 0 %
Basophils Absolute: 0 10*3/uL (ref 0–0.1)
EOS ABS: 0.1 10*3/uL (ref 0–0.7)
EOS PCT: 1 %
HCT: 27.8 % — ABNORMAL LOW (ref 35.0–47.0)
HEMOGLOBIN: 9.2 g/dL — AB (ref 12.0–16.0)
Lymphocytes Relative: 15 %
Lymphs Abs: 2.5 10*3/uL (ref 1.0–3.6)
MCH: 29.2 pg (ref 26.0–34.0)
MCHC: 33.1 g/dL (ref 32.0–36.0)
MCV: 88.2 fL (ref 80.0–100.0)
MONO ABS: 1.2 10*3/uL — AB (ref 0.2–0.9)
MONOS PCT: 7 %
NEUTROS PCT: 77 %
Neutro Abs: 12.9 10*3/uL — ABNORMAL HIGH (ref 1.4–6.5)
Platelets: 67 10*3/uL — ABNORMAL LOW (ref 150–440)
RBC: 3.15 MIL/uL — ABNORMAL LOW (ref 3.80–5.20)
RDW: 15 % — ABNORMAL HIGH (ref 11.5–14.5)
WBC: 16.6 10*3/uL — ABNORMAL HIGH (ref 3.6–11.0)

## 2016-02-01 LAB — MAGNESIUM: Magnesium: 1.5 mg/dL — ABNORMAL LOW (ref 1.7–2.4)

## 2016-02-01 MED ORDER — HEPARIN SOD (PORK) LOCK FLUSH 100 UNIT/ML IV SOLN
500.0000 [IU] | Freq: Once | INTRAVENOUS | Status: DC | PRN
Start: 2016-02-01 — End: 2016-11-02

## 2016-02-01 MED ORDER — ALTEPLASE 2 MG IJ SOLR: 2.0000 mg | Freq: Once | INTRAMUSCULAR | Status: DC | PRN

## 2016-02-01 MED ORDER — SODIUM CHLORIDE 0.9% FLUSH
10.0000 mL | INTRAVENOUS | Status: AC | PRN
  Filled 2016-02-01: qty 10

## 2016-02-01 MED ORDER — HEPARIN SOD (PORK) LOCK FLUSH 100 UNIT/ML IV SOLN: 250.0000 [IU] | Freq: Once | INTRAVENOUS | Status: DC | PRN

## 2016-02-01 MED ORDER — SODIUM CHLORIDE 0.9% FLUSH
10.0000 mL | INTRAVENOUS | Status: AC | PRN
  Administered 2016-02-01 (×2): 10 mL via INTRAVENOUS
  Filled 2016-02-01: qty 10

## 2016-02-01 MED ORDER — SODIUM CHLORIDE 0.9 % IV SOLN
Freq: Once | INTRAVENOUS | Status: AC
  Administered 2016-02-01: 12:00:00 via INTRAVENOUS
  Filled 2016-02-01: qty 250

## 2016-02-01 MED ORDER — SODIUM CHLORIDE 0.9 % IJ SOLN
10.0000 mL | INTRAMUSCULAR | Status: DC | PRN
  Filled 2016-02-01: qty 10

## 2016-02-01 MED ORDER — HEPARIN SOD (PORK) LOCK FLUSH 100 UNIT/ML IV SOLN: 500.0000 [IU] | Freq: Once | INTRAVENOUS | Status: AC

## 2016-02-01 MED ORDER — HEPARIN SOD (PORK) LOCK FLUSH 100 UNIT/ML IV SOLN
500.0000 [IU] | Freq: Once | INTRAVENOUS | Status: AC
  Administered 2016-02-01: 500 [IU] via INTRAVENOUS

## 2016-02-02 ENCOUNTER — Inpatient Hospital Stay: Payer: BLUE CROSS/BLUE SHIELD

## 2016-02-08 ENCOUNTER — Inpatient Hospital Stay: Payer: BLUE CROSS/BLUE SHIELD

## 2016-02-08 ENCOUNTER — Inpatient Hospital Stay (HOSPITAL_BASED_OUTPATIENT_CLINIC_OR_DEPARTMENT_OTHER): Payer: BLUE CROSS/BLUE SHIELD | Admitting: Oncology

## 2016-02-08 ENCOUNTER — Encounter: Payer: Self-pay | Admitting: Oncology

## 2016-02-08 VITALS — HR 97 | Temp 97.7°F | Ht 65.0 in | Wt 143.1 lb

## 2016-02-08 DIAGNOSIS — C7931 Secondary malignant neoplasm of brain: Secondary | ICD-10-CM | POA: Diagnosis not present

## 2016-02-08 DIAGNOSIS — Z801 Family history of malignant neoplasm of trachea, bronchus and lung: Secondary | ICD-10-CM

## 2016-02-08 DIAGNOSIS — E119 Type 2 diabetes mellitus without complications: Secondary | ICD-10-CM

## 2016-02-08 DIAGNOSIS — I959 Hypotension, unspecified: Secondary | ICD-10-CM

## 2016-02-08 DIAGNOSIS — Z923 Personal history of irradiation: Secondary | ICD-10-CM

## 2016-02-08 DIAGNOSIS — F17210 Nicotine dependence, cigarettes, uncomplicated: Secondary | ICD-10-CM

## 2016-02-08 DIAGNOSIS — B370 Candidal stomatitis: Secondary | ICD-10-CM

## 2016-02-08 DIAGNOSIS — C7900 Secondary malignant neoplasm of unspecified kidney and renal pelvis: Secondary | ICD-10-CM | POA: Diagnosis not present

## 2016-02-08 DIAGNOSIS — C3432 Malignant neoplasm of lower lobe, left bronchus or lung: Secondary | ICD-10-CM | POA: Diagnosis not present

## 2016-02-08 DIAGNOSIS — R590 Localized enlarged lymph nodes: Secondary | ICD-10-CM

## 2016-02-08 DIAGNOSIS — E876 Hypokalemia: Secondary | ICD-10-CM

## 2016-02-08 DIAGNOSIS — Z79899 Other long term (current) drug therapy: Secondary | ICD-10-CM

## 2016-02-08 DIAGNOSIS — C7970 Secondary malignant neoplasm of unspecified adrenal gland: Secondary | ICD-10-CM

## 2016-02-08 DIAGNOSIS — Z7689 Persons encountering health services in other specified circumstances: Secondary | ICD-10-CM

## 2016-02-08 LAB — COMPREHENSIVE METABOLIC PANEL
ALT: 15 U/L (ref 14–54)
ANION GAP: 8 (ref 5–15)
AST: 23 U/L (ref 15–41)
Albumin: 3.3 g/dL — ABNORMAL LOW (ref 3.5–5.0)
Alkaline Phosphatase: 65 U/L (ref 38–126)
BILIRUBIN TOTAL: 0.6 mg/dL (ref 0.3–1.2)
BUN: 5 mg/dL — AB (ref 6–20)
CO2: 24 mmol/L (ref 22–32)
Calcium: 8.9 mg/dL (ref 8.9–10.3)
Chloride: 101 mmol/L (ref 101–111)
Creatinine, Ser: 0.73 mg/dL (ref 0.44–1.00)
GFR calc Af Amer: >60 mL/min (ref >60)
Glucose, Bld: 217 mg/dL — ABNORMAL HIGH (ref 65–99)
POTASSIUM: 3.3 mmol/L — AB (ref 3.5–5.1)
Sodium: 133 mmol/L — ABNORMAL LOW (ref 135–145)
TOTAL PROTEIN: 6.7 g/dL (ref 6.5–8.1)

## 2016-02-08 LAB — CBC WITH DIFFERENTIAL/PLATELET
BASOS PCT: 1 %
Basophils Absolute: 0.1 10*3/uL (ref 0–0.1)
EOS ABS: 0.1 10*3/uL (ref 0–0.7)
Eosinophils Relative: 1 %
HCT: 29.1 % — ABNORMAL LOW (ref 35.0–47.0)
Hemoglobin: 9.7 g/dL — ABNORMAL LOW (ref 12.0–16.0)
Lymphocytes Relative: 26 %
Lymphs Abs: 2 10*3/uL (ref 1.0–3.6)
MCH: 30.2 pg (ref 26.0–34.0)
MCHC: 33.5 g/dL (ref 32.0–36.0)
MCV: 89.9 fL (ref 80.0–100.0)
MONO ABS: 0.9 10*3/uL (ref 0.2–0.9)
MONOS PCT: 12 %
NEUTROS PCT: 60 %
Neutro Abs: 4.5 10*3/uL (ref 1.4–6.5)
Platelets: 288 10*3/uL (ref 150–440)
RBC: 3.23 MIL/uL — ABNORMAL LOW (ref 3.80–5.20)
RDW: 18.5 % — AB (ref 11.5–14.5)
WBC: 7.5 10*3/uL (ref 3.6–11.0)

## 2016-02-08 LAB — MAGNESIUM: MAGNESIUM: 1.7 mg/dL (ref 1.7–2.4)

## 2016-02-08 MED ORDER — SODIUM CHLORIDE 0.9 % IV SOLN
Freq: Once | INTRAVENOUS | Status: AC
  Administered 2016-02-08: 11:00:00 via INTRAVENOUS
  Filled 2016-02-08: qty 100

## 2016-02-08 MED ORDER — PALONOSETRON HCL INJECTION 0.25 MG/5ML
0.2500 mg | Freq: Once | INTRAVENOUS | Status: AC
  Administered 2016-02-08: 0.25 mg via INTRAVENOUS
  Filled 2016-02-08: qty 5

## 2016-02-08 MED ORDER — SODIUM CHLORIDE 0.9 % IV SOLN
Freq: Once | INTRAVENOUS | Status: AC
  Administered 2016-02-08: 11:00:00 via INTRAVENOUS
  Filled 2016-02-08: qty 1000

## 2016-02-08 MED ORDER — HEPARIN SOD (PORK) LOCK FLUSH 100 UNIT/ML IV SOLN
500.0000 [IU] | Freq: Once | INTRAVENOUS | Status: AC | PRN
  Administered 2016-02-08: 500 [IU]
  Filled 2016-02-08: qty 5

## 2016-02-08 MED ORDER — SODIUM CHLORIDE 0.9 % IV SOLN
10.0000 mg | Freq: Once | INTRAVENOUS | Status: AC
  Administered 2016-02-08: 10 mg via INTRAVENOUS
  Filled 2016-02-08: qty 1

## 2016-02-08 MED ORDER — SODIUM CHLORIDE 0.9 % IV SOLN
80.0000 mg/m2 | Freq: Once | INTRAVENOUS | Status: AC
  Administered 2016-02-08: 140 mg via INTRAVENOUS
  Filled 2016-02-08: qty 7

## 2016-02-08 MED ORDER — SODIUM CHLORIDE 0.9 % IJ SOLN
10.0000 mL | INTRAMUSCULAR | Status: DC | PRN
  Administered 2016-02-08: 10 mL
  Filled 2016-02-08: qty 10

## 2016-02-08 MED ORDER — SODIUM CHLORIDE 0.9 % IV SOLN
532.0000 mg | Freq: Once | INTRAVENOUS | Status: AC
  Administered 2016-02-08: 530 mg via INTRAVENOUS
  Filled 2016-02-08: qty 53

## 2016-02-08 NOTE — Progress Notes (Signed)
[place] @ [facility] [phone]) [phone]  Fax:(336) [place]  Kayte Borchard OB: [birth year]  MR#: [id number]  YIF#:[id number]  Patient Care Team: Marden Noble, MD as PCP - General (Internal Medicine)  CHIEF COMPLAINT: Oncology history Chief Complaint  Patient presents with  . Lung Mass   1.   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 and treat mass adrenal mass.  Diagnosis in January of 2017.  Diagnosis by the right supraclavicular lymph node biopsy.. 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 patient has finished radiation therapy. ( feb of 2017) 3, starting    Chemotherapy    Feb  6 th ,, 2017 with carboplatinum and VP-16 INTERVAL HISTORY:  65 year old lady came today for second cycle of chemotherapy. Appetite has been poor.  Patient had developed a fungal infection (candidiasis of mouth) and started on Diflucan.  Which helped. Patient required intravenous fluid.  Had 1 episode of diarrhea.  Here for continuation of second cycle of chemotherapy Patient had hypokalemia and hypomagnesemia.  Patient is here for the next cycle of chemotherapy.  Patient's speech has improved headache is better.  No nausea.  No vomiting.  No diarrhea. Data has been stabilizing.  No significant loss of weight  Patient has     Number of questions regarding chemotherapy  VISIT DIAGNOSIS:     ICD-9-CM ICD-10-CM   1. Cancer of lower lobe of left lung (HCC) 162.5 C34.32 Comprehensive metabolic panel     Magnesium     Potassium     Potassium     Potassium     Potassium     Comprehensive metabolic panel     CT Abdomen W Contrast     CT Chest W Contrast     MR Brain W Wo Contrast  2. Brain metastasis ([place]) 198.3 C79.31 MR Brain W Wo Contrast     Oncology History   1. 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 Diagnosis in January of 2017.   Diagnosis by the right supraclavicular lymph node biopsy.  T2 N3 M1 stage IV disease with metastases to the brain, kidney, and adrenal gland.  2. Patient has finished radiation therapy. (Feb of 2017)  3. Startingchemotherapy December 26, 2015 with carboplatinum and VP-16.     Cancer of lower lobe of left lung ([place])   12/05/2015 Initial Diagnosis Cancer of lower lobe of left lung [facility])    Oncology Flowsheet 12/26/2015 12/27/2015 12/28/2015 01/18/2016 01/19/2016 01/20/2016 02/08/2016  Day, Cycle Day 1, Cycle 1 Day 2, Cycle 1 Day 3, Cycle 1 Day 1, Cycle 2 Day 2, Cycle 2 Day 3, Cycle 2 Day 1, Cycle 3  ALPRAZolam (XANAX) PO - - - - - - -  CARBOplatin (PARAPLATIN) IV 530 mg - - 530 mg - - -  dexamethasone (DECADRON) IV 10 mg - - 10 mg - - 10 mg  dexamethasone (DECADRON) PO - - - - - - -  etoposide (VEPESID) IV 80 mg/m2 80 mg/m2 80 mg/m2 80 mg/m2 80 mg/m2 80 mg/m2 80 mg/m2  ondansetron (ZOFRAN) IV - [ 8 mg ] [ 8 mg ] - [ 8 mg ] [ 8 mg ] -  palonosetron (ALOXI) IV 0.25 mg - - 0.25 mg - - 0.25 mg  pegfilgrastim (NEULASTA ONPRO KIT) [place] - - 6  mg - - 6 mg -     REVIEW OF SYSTEMS:   Gen. status: Patient is quite emotional.  Not in any acute distress. HEENT: Slurred speech.  Headache. Lungs: Increasing shortness of breath patient just quit smoking no hemoptysis or chest pain GI: No nausea no vomiting no diarrhea poor appetite Lower extremity no swelling Skin: No rash Neurological system and slurred speech.  No other focal signs Musculoskeletal system no bony pain GU: No dysuria or hematuria Ha s  Finished  radiation treatment  here to initiate chemotherapy with carboplatinum and VP-16  As per HPI. Otherwise, a complete review of systems is negatve.  PAST MEDICAL HISTORY: Past Medical History  Diagnosis Date  . Diabetes mellitus without complication ([place])   . Cancer of lung ([place]) 12/05/2015    PAST SURGICAL  HISTORY: Past Surgical History  Procedure Laterality Date  . Abdominal hysterectomy    . Peripheral vascular catheterization N/A 01/13/2016    Procedure: Glori Luis Cath Insertion;  Surgeon: Katha Cabal, MD;  Location: [place] [facility];  Service: Cardiovascular;  Laterality: N/A;    FAMILY HISTORYThere is no significant family history of breast cancer, ovarian cancer, colon cancer  History of lung cancer in family     ADVANCED DIRECTIVES:  Patient does not have any living will or healthcare power of attorney.  Information was given .  Available resources had been discussed.  We will follow-up on subsequent appointments regarding this issue  HEALTH MAINTENANCE: Social History  Substance Use Topics  . Smoking status: Former Smoker -- 1.00 packs/day for 49 years    Types: Cigarettes    Quit date: 11/19/2011  . Smokeless tobacco: None  . Alcohol Use: No      Allergies  Allergen Reactions  . Amoxicillin Other (See Comments)    Reaction:  Unknown   . Levofloxacin Other (See Comments)    Reaction:  Joint and muscle pain  Pt states that this medication made her unable to walk.     Current Outpatient Prescriptions  Medication Sig Dispense Refill  . ALPRAZolam (XANAX) 0.5 MG tablet Take 0.5 mg by mouth 3 (three) times daily as needed for anxiety.     Marland Kitchen glimepiride (AMARYL) 2 MG tablet     . latanoprost (XALATAN) 0.005 % ophthalmic solution Place 1 drop into both eyes at bedtime.    . lidocaine-prilocaine (EMLA) cream Apply 1 application topically as needed. 30 g 3  . Omega-3 Fatty Acids (FISH OIL) 1000 MG CAPS Take 2,000 mg by mouth 2 (two) times daily.     Marland Kitchen omeprazole (PRILOSEC) 20 MG capsule Take 20 mg by mouth 2 (two) times daily.     . potassium chloride SA (K-DUR,KLOR-CON) 20 MEQ tablet Take 1 tablet (20 mEq total) by mouth daily. 30 tablet 3  . prochlorperazine (COMPAZINE) 10 MG tablet Take 1 tablet (10 mg total) by mouth every 6 (six) hours as needed for nausea or  vomiting. 30 tablet 3  . sitaGLIPtin (JANUVIA) 100 MG tablet Take 100 mg by mouth daily.     No current facility-administered medications for this visit.   Facility-Administered Medications Ordered in Other Visits  Medication Dose Route Frequency Provider Last Rate Last Dose  . alteplase (CATHFLO ACTIVASE) injection 2 mg  2 mg Intracatheter Once PRN Forest Gleason, MD      . CARBOplatin (PARAPLATIN) 530 mg in sodium chloride 0.9 % 250 mL chemo infusion  530 mg Intravenous Once Forest Gleason, MD      .  etoposide (VEPESID) 140 mg in sodium chloride 0.9 % 500 mL chemo infusion  80 mg/m2 (Treatment Plan Actual) Intravenous Once Forest Gleason, MD 507 mL/hr at 02/08/16 1222 140 mg at 02/08/16 1222  . heparin lock flush 100 unit/mL  500 Units Intravenous Once Forest Gleason, MD      . heparin lock flush 100 unit/mL  500 Units Intracatheter Once PRN Forest Gleason, MD      . heparin lock flush 100 unit/mL  250 Units Intracatheter Once PRN Forest Gleason, MD      . heparin lock flush 100 unit/mL  500 Units Intracatheter Once PRN Forest Gleason, MD      . sodium chloride 0.9 % injection 10 mL  10 mL Intracatheter PRN Forest Gleason, MD      . sodium chloride 0.9 % injection 10 mL  10 mL Intracatheter PRN Forest Gleason, MD   10 mL at 02/08/16 0912  . sodium chloride flush (NS) 0.9 % injection 10 mL  10 mL Intravenous PRN Forest Gleason, MD      . sodium chloride flush (NS) 0.9 % injection 10 mL  10 mL Intravenous PRN Forest Gleason, MD   10 mL at 02/01/16 1439    OBJECTIVE: PHYSICAL EXAM: General   status: Patient is alert oriented not any acute distress HEENT: No soreness in the mouth.  No evidence of candidiasis Lymphatic system: Palpable right supraclavicular lymph node small no other lymphadenopathy Cardiac exam revealed the PMI to be normally situated and sized. The rhythm was regular and no extrasystoles were noted during several minutes of auscultation. The first and second heart sounds were normal and physiologic  splitting of the second heart sound was noted. There were no murmurs, rubs, clicks, or gallops. Examination of the chest was unremarkable. There were no bony deformities, no asymmetry, and no other abnormalities. Skin: No rash Lower extremity swelling has improved Neurological system: Higher functions patient has dysphagia. All other cranial NERVES   are intact Motor system equal on both sides without any significant loss of power Sensory system no localizing sign Cerebellar ataxia grade 1, improving Psychiatric system: Anxiety mild depression All other systems have been reviewed.  Filed Vitals:   02/08/16 0927  BP: 121/82  Pulse: 97  Temp: 97.7 F (36.5 C)     Body mass index is 23.81 kg/(m^2).    ECOG FS:2 - Symptomatic, <50% confined to bed  LAB RESULTS:  Infusion on 02/08/2016  Component Date Value Ref Range Status  . WBC 02/08/2016 7.5  3.6 - 11.0 K/uL Final  . RBC 02/08/2016 3.23* 3.80 - 5.20 MIL/uL Final  . Hemoglobin 02/08/2016 9.7* 12.0 - 16.0 g/dL Final  . HCT 02/08/2016 29.1* 35.0 - 47.0 % Final  . MCV 02/08/2016 89.9  80.0 - 100.0 fL Final  . MCH 02/08/2016 30.2  26.0 - 34.0 pg Final  . MCHC 02/08/2016 33.5  32.0 - 36.0 g/dL Final  . RDW 02/08/2016 18.5* 11.5 - 14.5 % Final  . Platelets 02/08/2016 288  150 - 440 K/uL Final  . Neutrophils Relative % 02/08/2016 60   Final  . Neutro Abs 02/08/2016 4.5  1.4 - 6.5 K/uL Final  . Lymphocytes Relative 02/08/2016 26   Final  . Lymphs Abs 02/08/2016 2.0  1.0 - 3.6 K/uL Final  . Monocytes Relative 02/08/2016 12   Final  . Monocytes Absolute 02/08/2016 0.9  0.2 - 0.9 K/uL Final  . Eosinophils Relative 02/08/2016 1   Final  . Eosinophils Absolute 02/08/2016 0.1  0 - 0.7 K/uL Final  . Basophils Relative 02/08/2016 1   Final  . Basophils Absolute 02/08/2016 0.1  0 - 0.1 K/uL Final  . Sodium 02/08/2016 133* 135 - 145 mmol/L Final  . Potassium 02/08/2016 3.3* 3.5 - 5.1 mmol/L Final  . Chloride 02/08/2016 101  101 - 111 mmol/L  Final  . CO2 02/08/2016 24  22 - 32 mmol/L Final  . Glucose, Bld 02/08/2016 217* 65 - 99 mg/dL Final  . BUN 02/08/2016 5* 6 - 20 mg/dL Final  . Creatinine, Ser 02/08/2016 0.73  0.44 - 1.00 mg/dL Final  . Calcium 02/08/2016 8.9  8.9 - 10.3 mg/dL Final  . Total Protein 02/08/2016 6.7  6.5 - 8.1 g/dL Final  . Albumin 02/08/2016 3.3* 3.5 - 5.0 g/dL Final  . AST 02/08/2016 23  15 - 41 U/L Final  . ALT 02/08/2016 15  14 - 54 U/L Final  . Alkaline Phosphatase 02/08/2016 65  38 - 126 U/L Final  . Total Bilirubin 02/08/2016 0.6  0.3 - 1.2 mg/dL Final  . GFR calc non Af Amer 02/08/2016 >60  >60 mL/min Final  . GFR calc Af Amer 02/08/2016 >60  >60 mL/min Final   Comment: (NOTE) The eGFR has been calculated using the CKD EPI equation. This calculation has not been validated in all clinical situations. eGFR's persistently <60 mL/min signify possible Chronic Kidney Disease.   . Anion gap 02/08/2016 8  5 - 15 Final  . Magnesium 02/08/2016 1.7  1.7 - 2.4 mg/dL Final     STUDIES: No results found.  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  Both mediastinal lymph node.  Ms. and treatment mass.  Probably metastases to the kidney. Adrenal metastases 2. Has finished radiation therapy to the brain (February, 2017)  2.  Hypokalemia hypomagnesemia Began to replacement of potassium and magnesium.  Patient will continue third cycle of chemotherapy with carboplatinum and VP-16 tolerating very well. PATIENT  tolerated last treatment without any evidence of oral candidiasis.  Sheet to Diflucan as a preventive medicine.  Continue third cycle of chemotherapy starting today patient will get fourth cycle in 3 weeks and then reevaluation with CT scan of chest and abdomen and MRI scan of brain with and without contrast for complete reevaluation of disease to decide on further options of treatment  Patient expressed understanding and  was in agreement with this plan. She also understands that She can call clinic at any time with any questions, concerns, or complaints.    Cancer of lower lobe of left lung ([place])   Staging form: Lung, AJCC 7th Edition     Clinical: T2, N3, M1 - Signed by Forest Gleason, MD on 12/05/2015   Forest Gleason, MD   02/08/2016 12:45 PM

## 2016-02-09 ENCOUNTER — Inpatient Hospital Stay: Payer: BLUE CROSS/BLUE SHIELD

## 2016-02-09 VITALS — BP 118/80 | HR 77 | Temp 97.7°F

## 2016-02-09 DIAGNOSIS — C3432 Malignant neoplasm of lower lobe, left bronchus or lung: Secondary | ICD-10-CM

## 2016-02-09 DIAGNOSIS — R590 Localized enlarged lymph nodes: Secondary | ICD-10-CM

## 2016-02-09 MED ORDER — SODIUM CHLORIDE 0.9 % IV SOLN
80.0000 mg/m2 | Freq: Once | INTRAVENOUS | Status: AC
  Administered 2016-02-09: 140 mg via INTRAVENOUS
  Filled 2016-02-09: qty 7

## 2016-02-09 MED ORDER — SODIUM CHLORIDE 0.9 % IV SOLN
Freq: Once | INTRAVENOUS | Status: AC
  Administered 2016-02-09: 14:00:00 via INTRAVENOUS
  Filled 2016-02-09: qty 4

## 2016-02-09 MED ORDER — SODIUM CHLORIDE 0.9 % IV SOLN
Freq: Once | INTRAVENOUS | Status: AC
  Administered 2016-02-09: 14:00:00 via INTRAVENOUS
  Filled 2016-02-09: qty 1000

## 2016-02-09 MED ORDER — SODIUM CHLORIDE 0.9% FLUSH
10.0000 mL | INTRAVENOUS | Status: DC | PRN
  Administered 2016-02-09: 10 mL via INTRAVENOUS
  Filled 2016-02-09: qty 10

## 2016-02-09 MED ORDER — HEPARIN SOD (PORK) LOCK FLUSH 100 UNIT/ML IV SOLN
500.0000 [IU] | Freq: Once | INTRAVENOUS | Status: AC
  Administered 2016-02-09: 500 [IU] via INTRAVENOUS
  Filled 2016-02-09: qty 5

## 2016-02-10 ENCOUNTER — Inpatient Hospital Stay: Payer: BLUE CROSS/BLUE SHIELD

## 2016-02-10 VITALS — BP 104/69 | HR 77 | Temp 98.5°F | Resp 20

## 2016-02-10 DIAGNOSIS — C3432 Malignant neoplasm of lower lobe, left bronchus or lung: Secondary | ICD-10-CM

## 2016-02-10 DIAGNOSIS — R590 Localized enlarged lymph nodes: Secondary | ICD-10-CM

## 2016-02-10 MED ORDER — SODIUM CHLORIDE 0.9 % IV SOLN
80.0000 mg/m2 | Freq: Once | INTRAVENOUS | Status: AC
  Administered 2016-02-10: 140 mg via INTRAVENOUS
  Filled 2016-02-10: qty 7

## 2016-02-10 MED ORDER — HEPARIN SOD (PORK) LOCK FLUSH 100 UNIT/ML IV SOLN
500.0000 [IU] | Freq: Once | INTRAVENOUS | Status: AC
  Administered 2016-02-10: 500 [IU] via INTRAVENOUS

## 2016-02-10 MED ORDER — SODIUM CHLORIDE 0.9 % IV SOLN
Freq: Once | INTRAVENOUS | Status: AC
  Administered 2016-02-10: 14:00:00 via INTRAVENOUS
  Filled 2016-02-10: qty 4

## 2016-02-10 MED ORDER — PEGFILGRASTIM 6 MG/0.6ML ~~LOC~~ PSKT
6.0000 mg | PREFILLED_SYRINGE | Freq: Once | SUBCUTANEOUS | Status: AC
  Administered 2016-02-10: 6 mg via SUBCUTANEOUS
  Filled 2016-02-10: qty 0.6

## 2016-02-10 MED ORDER — HEPARIN SOD (PORK) LOCK FLUSH 100 UNIT/ML IV SOLN
INTRAVENOUS | Status: AC
  Filled 2016-02-10: qty 5

## 2016-02-10 MED ORDER — SODIUM CHLORIDE 0.9 % IV SOLN
Freq: Once | INTRAVENOUS | Status: AC
  Administered 2016-02-10: 14:00:00 via INTRAVENOUS
  Filled 2016-02-10: qty 1000

## 2016-02-15 ENCOUNTER — Inpatient Hospital Stay: Payer: BLUE CROSS/BLUE SHIELD

## 2016-02-15 DIAGNOSIS — E876 Hypokalemia: Secondary | ICD-10-CM

## 2016-02-15 DIAGNOSIS — C3432 Malignant neoplasm of lower lobe, left bronchus or lung: Secondary | ICD-10-CM | POA: Diagnosis not present

## 2016-02-15 LAB — CBC WITH DIFFERENTIAL/PLATELET
BASOS PCT: 1 %
Basophils Absolute: 0.2 10*3/uL — ABNORMAL HIGH (ref 0–0.1)
Eosinophils Absolute: 0.2 10*3/uL (ref 0–0.7)
Eosinophils Relative: 1 %
HEMATOCRIT: 25.2 % — AB (ref 35.0–47.0)
HEMOGLOBIN: 8.5 g/dL — AB (ref 12.0–16.0)
LYMPHS ABS: 2 10*3/uL (ref 1.0–3.6)
LYMPHS PCT: 13 %
MCH: 30.3 pg (ref 26.0–34.0)
MCHC: 33.8 g/dL (ref 32.0–36.0)
MCV: 89.8 fL (ref 80.0–100.0)
MONOS PCT: 3 %
Monocytes Absolute: 0.5 10*3/uL (ref 0.2–0.9)
Neutro Abs: 12.2 10*3/uL — ABNORMAL HIGH (ref 1.4–6.5)
Neutrophils Relative %: 82 %
Platelets: 182 10*3/uL (ref 150–440)
RBC: 2.81 MIL/uL — ABNORMAL LOW (ref 3.80–5.20)
RDW: 18.3 % — AB (ref 11.5–14.5)
WBC: 15.1 10*3/uL — ABNORMAL HIGH (ref 3.6–11.0)

## 2016-02-15 LAB — MAGNESIUM: Magnesium: 1.7 mg/dL (ref 1.7–2.4)

## 2016-02-15 LAB — POTASSIUM: POTASSIUM: 3.9 mmol/L (ref 3.5–5.1)

## 2016-02-15 MED ORDER — SODIUM CHLORIDE 0.9% FLUSH
10.0000 mL | Freq: Once | INTRAVENOUS | Status: AC
  Administered 2016-02-15: 10 mL via INTRAVENOUS
  Filled 2016-02-15: qty 10

## 2016-02-15 MED ORDER — HEPARIN SOD (PORK) LOCK FLUSH 100 UNIT/ML IV SOLN
INTRAVENOUS | Status: AC
  Filled 2016-02-15: qty 5

## 2016-02-15 MED ORDER — HEPARIN SOD (PORK) LOCK FLUSH 100 UNIT/ML IV SOLN
500.0000 [IU] | Freq: Once | INTRAVENOUS | Status: AC
  Administered 2016-02-15: 500 [IU] via INTRAVENOUS

## 2016-02-22 ENCOUNTER — Inpatient Hospital Stay: Payer: BLUE CROSS/BLUE SHIELD

## 2016-02-22 ENCOUNTER — Inpatient Hospital Stay: Payer: BLUE CROSS/BLUE SHIELD | Attending: Oncology

## 2016-02-22 DIAGNOSIS — E876 Hypokalemia: Secondary | ICD-10-CM | POA: Insufficient documentation

## 2016-02-22 DIAGNOSIS — Z801 Family history of malignant neoplasm of trachea, bronchus and lung: Secondary | ICD-10-CM | POA: Diagnosis not present

## 2016-02-22 DIAGNOSIS — Z923 Personal history of irradiation: Secondary | ICD-10-CM | POA: Insufficient documentation

## 2016-02-22 DIAGNOSIS — Z87891 Personal history of nicotine dependence: Secondary | ICD-10-CM | POA: Insufficient documentation

## 2016-02-22 DIAGNOSIS — Z79899 Other long term (current) drug therapy: Secondary | ICD-10-CM | POA: Insufficient documentation

## 2016-02-22 DIAGNOSIS — C7931 Secondary malignant neoplasm of brain: Secondary | ICD-10-CM | POA: Insufficient documentation

## 2016-02-22 DIAGNOSIS — R42 Dizziness and giddiness: Secondary | ICD-10-CM | POA: Diagnosis not present

## 2016-02-22 DIAGNOSIS — C3432 Malignant neoplasm of lower lobe, left bronchus or lung: Secondary | ICD-10-CM | POA: Diagnosis present

## 2016-02-22 DIAGNOSIS — C7900 Secondary malignant neoplasm of unspecified kidney and renal pelvis: Secondary | ICD-10-CM | POA: Insufficient documentation

## 2016-02-22 DIAGNOSIS — C7970 Secondary malignant neoplasm of unspecified adrenal gland: Secondary | ICD-10-CM | POA: Diagnosis not present

## 2016-02-22 DIAGNOSIS — Z5111 Encounter for antineoplastic chemotherapy: Secondary | ICD-10-CM | POA: Insufficient documentation

## 2016-02-22 DIAGNOSIS — C3412 Malignant neoplasm of upper lobe, left bronchus or lung: Secondary | ICD-10-CM | POA: Insufficient documentation

## 2016-02-22 LAB — CBC WITH DIFFERENTIAL/PLATELET
BASOS PCT: 2 %
Basophils Absolute: 0.2 10*3/uL — ABNORMAL HIGH (ref 0–0.1)
EOS ABS: 0.1 10*3/uL (ref 0–0.7)
Eosinophils Relative: 1 %
HCT: 24.3 % — ABNORMAL LOW (ref 35.0–47.0)
HEMOGLOBIN: 8.1 g/dL — AB (ref 12.0–16.0)
LYMPHS ABS: 2.5 10*3/uL (ref 1.0–3.6)
Lymphocytes Relative: 22 %
MCH: 29.9 pg (ref 26.0–34.0)
MCHC: 33.2 g/dL (ref 32.0–36.0)
MCV: 90.1 fL (ref 80.0–100.0)
MONO ABS: 0.9 10*3/uL (ref 0.2–0.9)
MONOS PCT: 8 %
NEUTROS PCT: 67 %
Neutro Abs: 7.6 10*3/uL — ABNORMAL HIGH (ref 1.4–6.5)
Platelets: 34 10*3/uL — ABNORMAL LOW (ref 150–440)
RBC: 2.69 MIL/uL — ABNORMAL LOW (ref 3.80–5.20)
RDW: 17.5 % — AB (ref 11.5–14.5)
WBC: 11.3 10*3/uL — ABNORMAL HIGH (ref 3.6–11.0)

## 2016-02-22 LAB — MAGNESIUM: MAGNESIUM: 1.7 mg/dL (ref 1.7–2.4)

## 2016-02-22 LAB — POTASSIUM: Potassium: 4 mmol/L (ref 3.5–5.1)

## 2016-02-29 ENCOUNTER — Inpatient Hospital Stay: Payer: BLUE CROSS/BLUE SHIELD

## 2016-02-29 ENCOUNTER — Inpatient Hospital Stay (HOSPITAL_BASED_OUTPATIENT_CLINIC_OR_DEPARTMENT_OTHER): Payer: BLUE CROSS/BLUE SHIELD | Admitting: Internal Medicine

## 2016-02-29 VITALS — BP 103/68 | HR 106 | Temp 97.3°F | Resp 18 | Wt 140.0 lb

## 2016-02-29 DIAGNOSIS — C3432 Malignant neoplasm of lower lobe, left bronchus or lung: Secondary | ICD-10-CM

## 2016-02-29 DIAGNOSIS — C7970 Secondary malignant neoplasm of unspecified adrenal gland: Secondary | ICD-10-CM

## 2016-02-29 DIAGNOSIS — C7900 Secondary malignant neoplasm of unspecified kidney and renal pelvis: Secondary | ICD-10-CM

## 2016-02-29 DIAGNOSIS — Z923 Personal history of irradiation: Secondary | ICD-10-CM

## 2016-02-29 DIAGNOSIS — R42 Dizziness and giddiness: Secondary | ICD-10-CM

## 2016-02-29 DIAGNOSIS — Z87891 Personal history of nicotine dependence: Secondary | ICD-10-CM

## 2016-02-29 DIAGNOSIS — C3412 Malignant neoplasm of upper lobe, left bronchus or lung: Secondary | ICD-10-CM | POA: Diagnosis not present

## 2016-02-29 DIAGNOSIS — C7931 Secondary malignant neoplasm of brain: Secondary | ICD-10-CM

## 2016-02-29 DIAGNOSIS — R590 Localized enlarged lymph nodes: Secondary | ICD-10-CM

## 2016-02-29 DIAGNOSIS — E876 Hypokalemia: Secondary | ICD-10-CM

## 2016-02-29 DIAGNOSIS — Z801 Family history of malignant neoplasm of trachea, bronchus and lung: Secondary | ICD-10-CM

## 2016-02-29 DIAGNOSIS — Z79899 Other long term (current) drug therapy: Secondary | ICD-10-CM

## 2016-02-29 LAB — CBC WITH DIFFERENTIAL/PLATELET
BASOS ABS: 0 10*3/uL (ref 0–0.1)
Basophils Relative: 1 %
EOS ABS: 0 10*3/uL (ref 0–0.7)
Eosinophils Relative: 1 %
HCT: 26.3 % — ABNORMAL LOW (ref 35.0–47.0)
HEMOGLOBIN: 8.9 g/dL — AB (ref 12.0–16.0)
LYMPHS ABS: 1.8 10*3/uL (ref 1.0–3.6)
LYMPHS PCT: 29 %
MCH: 31.5 pg (ref 26.0–34.0)
MCHC: 34 g/dL (ref 32.0–36.0)
MCV: 92.8 fL (ref 80.0–100.0)
Monocytes Absolute: 1 10*3/uL — ABNORMAL HIGH (ref 0.2–0.9)
Monocytes Relative: 16 %
Neutro Abs: 3.4 10*3/uL (ref 1.4–6.5)
Neutrophils Relative %: 53 %
Platelets: 174 10*3/uL (ref 150–440)
RBC: 2.84 MIL/uL — AB (ref 3.80–5.20)
RDW: 22.6 % — ABNORMAL HIGH (ref 11.5–14.5)
WBC: 6.2 10*3/uL (ref 3.6–11.0)

## 2016-02-29 LAB — COMPREHENSIVE METABOLIC PANEL
ALK PHOS: 55 U/L (ref 38–126)
ALT: 13 U/L — AB (ref 14–54)
AST: 22 U/L (ref 15–41)
Albumin: 3.8 g/dL (ref 3.5–5.0)
Anion gap: 5 (ref 5–15)
BUN: 11 mg/dL (ref 6–20)
CALCIUM: 8.8 mg/dL — AB (ref 8.9–10.3)
CHLORIDE: 105 mmol/L (ref 101–111)
CO2: 22 mmol/L (ref 22–32)
CREATININE: 0.81 mg/dL (ref 0.44–1.00)
GFR calc non Af Amer: >60 mL/min (ref >60)
GLUCOSE: 195 mg/dL — AB (ref 65–99)
Potassium: 3.3 mmol/L — ABNORMAL LOW (ref 3.5–5.1)
SODIUM: 132 mmol/L — AB (ref 135–145)
Total Bilirubin: 0.7 mg/dL (ref 0.3–1.2)
Total Protein: 6.7 g/dL (ref 6.5–8.1)

## 2016-02-29 LAB — MAGNESIUM: Magnesium: 1.8 mg/dL (ref 1.7–2.4)

## 2016-02-29 MED ORDER — SODIUM CHLORIDE 0.9 % IV SOLN
80.0000 mg/m2 | Freq: Once | INTRAVENOUS | Status: AC
  Administered 2016-02-29: 140 mg via INTRAVENOUS
  Filled 2016-02-29: qty 7

## 2016-02-29 MED ORDER — SODIUM CHLORIDE 0.9 % IV SOLN
521.5000 mg | Freq: Once | INTRAVENOUS | Status: AC
  Administered 2016-02-29: 520 mg via INTRAVENOUS
  Filled 2016-02-29: qty 52

## 2016-02-29 MED ORDER — HEPARIN SOD (PORK) LOCK FLUSH 100 UNIT/ML IV SOLN
500.0000 [IU] | Freq: Once | INTRAVENOUS | Status: AC
  Administered 2016-02-29: 500 [IU] via INTRAVENOUS
  Filled 2016-02-29: qty 5

## 2016-02-29 MED ORDER — SODIUM CHLORIDE 0.9 % IV SOLN
Freq: Once | INTRAVENOUS | Status: AC
  Administered 2016-02-29: 12:00:00 via INTRAVENOUS
  Filled 2016-02-29: qty 1000

## 2016-02-29 MED ORDER — PALONOSETRON HCL INJECTION 0.25 MG/5ML
0.2500 mg | Freq: Once | INTRAVENOUS | Status: AC
  Administered 2016-02-29: 0.25 mg via INTRAVENOUS
  Filled 2016-02-29: qty 5

## 2016-02-29 MED ORDER — SODIUM CHLORIDE 0.9% FLUSH
10.0000 mL | INTRAVENOUS | Status: DC | PRN
  Administered 2016-02-29: 10 mL via INTRAVENOUS
  Filled 2016-02-29: qty 10

## 2016-02-29 MED ORDER — SODIUM CHLORIDE 0.9 % IV SOLN
10.0000 mg | Freq: Once | INTRAVENOUS | Status: AC
  Administered 2016-02-29: 10 mg via INTRAVENOUS
  Filled 2016-02-29: qty 1

## 2016-02-29 NOTE — Progress Notes (Signed)
[place] @ [facility] [phone]  Fax:(336) [place]  Cynthia Carson OB: [birth year]  MR#: [id number]  PQA#:[id number]  Patient Care Team: Marden Noble, MD as PCP - General (Internal Medicine)  CHIEF COMPLAINT: Oncology history Chief Complaint  Patient presents with  . Cancer of lower lobe of left lung   1.   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  Diagnosis in January of 2017.  Diagnosis by the right supraclavicular lymph node biopsy.. 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 patient has finished radiation therapy. ( feb of 2017) 3, starting    Chemotherapy    Feb  6 th ,, 2017 with carboplatinum and VP-16 INTERVAL HISTORY:   65 year old female patient with above history of extensive stage small cell lung cancer currently on palliative chemotherapy with carboplatin and VP-16 status post 3 cycles is here for follow-up.  Patient's appetite is fair. Denies any nausea vomiting. She does complain of mild dizziness on standing up. No syncopal episodes. No skin rash. No tingling and numbness. No diarrhea. She has been keeping herself hydrated.  Patient denies any unusual headaches. Complains of sinus pressure. Complains of nasal drainage from allergies. She is on claritin.    VISIT DIAGNOSIS:   No diagnosis found.   Oncology History   1. 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 Diagnosis in January of 2017.   Diagnosis by the right supraclavicular lymph node biopsy.  T2 N3 M1 stage IV disease with metastases to the brain, kidney, and adrenal gland.  2. Patient has finished radiation therapy. (Feb of 2017)  3. Startingchemotherapy December 26, 2015 with carboplatinum and VP-16.     Cancer of lower lobe of left lung ([place])    12/05/2015 Initial Diagnosis Cancer of lower lobe of left lung [facility])    Oncology Flowsheet 12/28/2015 01/18/2016 01/19/2016 01/20/2016 02/08/2016 02/09/2016 02/10/2016  Day, Cycle Day 3, Cycle 1 Day 1, Cycle 2 Day 2, Cycle 2 Day 3, Cycle 2 Day 1, Cycle 3 Day 2, Cycle 3 Day 3, Cycle 3  ALPRAZolam (XANAX) PO - - - - - - -  CARBOplatin (PARAPLATIN) IV - 530 mg - - 530 mg - -  dexamethasone (DECADRON) IV - 10 mg - - 10 mg - -  dexamethasone (DECADRON) PO - - - - - - -  etoposide (VEPESID) IV 80 mg/m2 80 mg/m2 80 mg/m2 80 mg/m2 80 mg/m2 80 mg/m2 80 mg/m2  ondansetron (ZOFRAN) IV [ 8 mg ] - [ 8 mg ] [ 8 mg ] - [ 8 mg ] [ 8 mg ]  palonosetron (ALOXI) IV - 0.25 mg - - 0.25 mg - -  pegfilgrastim (NEULASTA ONPRO KIT) [place] 6 mg - - 6 mg - - 6 mg     REVIEW OF SYSTEMS:   Gen. status: Patient is quite emotional.  Not in any acute distress. Lungs: Increasing shortness of breath patient just quit smoking no hemoptysis or chest pain GI: No nausea no vomiting no diarrhea poor appetite Lower extremity no swelling Skin: No rash Neurological system and slurred speech.  No other focal signs Musculoskeletal system no bony pain GU: No dysuria or hematuria   As per HPI. Otherwise, a complete review of systems is negatve.  PAST MEDICAL HISTORY: Past Medical History  Diagnosis Date  .  Diabetes mellitus without complication ([place])   . Cancer of lung ([place]) 12/05/2015    PAST SURGICAL HISTORY: Past Surgical History  Procedure Laterality Date  . Abdominal hysterectomy    . Peripheral vascular catheterization N/A 01/13/2016    Procedure: Glori Luis Cath Insertion;  Surgeon: Katha Cabal, MD;  Location: [place] [facility];  Service: Cardiovascular;  Laterality: N/A;    FAMILY HISTORYThere is no significant family history of breast cancer, ovarian cancer, colon cancer  History of lung cancer in family     ADVANCED DIRECTIVES:  Patient does not have any living will or healthcare power of attorney.  Information  was given .  Available resources had been discussed.  We will follow-up on subsequent appointments regarding this issue  HEALTH MAINTENANCE: Social History  Substance Use Topics  . Smoking status: Former Smoker -- 1.00 packs/day for 49 years    Types: Cigarettes    Quit date: 11/19/2011  . Smokeless tobacco: Not on file  . Alcohol Use: No      Allergies  Allergen Reactions  . Amoxicillin Other (See Comments)    Reaction:  Unknown   . Levofloxacin Other (See Comments)    Reaction:  Joint and muscle pain  Pt states that this medication made her unable to walk.     Current Outpatient Prescriptions  Medication Sig Dispense Refill  . ALPRAZolam (XANAX) 0.5 MG tablet Take 0.5 mg by mouth 3 (three) times daily as needed for anxiety.     Marland Kitchen glimepiride (AMARYL) 2 MG tablet     . latanoprost (XALATAN) 0.005 % ophthalmic solution Place 1 drop into both eyes at bedtime.    . lidocaine-prilocaine (EMLA) cream Apply 1 application topically as needed. 30 g 3  . Omega-3 Fatty Acids (FISH OIL) 1000 MG CAPS Take 2,000 mg by mouth 2 (two) times daily.     Marland Kitchen omeprazole (PRILOSEC) 20 MG capsule Take 20 mg by mouth 2 (two) times daily.     . potassium chloride SA (K-DUR,KLOR-CON) 20 MEQ tablet Take 1 tablet (20 mEq total) by mouth daily. 30 tablet 3  . prochlorperazine (COMPAZINE) 10 MG tablet Take 1 tablet (10 mg total) by mouth every 6 (six) hours as needed for nausea or vomiting. 30 tablet 3  . sitaGLIPtin (JANUVIA) 100 MG tablet Take 100 mg by mouth daily.     No current facility-administered medications for this visit.   Facility-Administered Medications Ordered in Other Visits  Medication Dose Route Frequency Provider Last Rate Last Dose  . alteplase (CATHFLO ACTIVASE) injection 2 mg  2 mg Intracatheter Once PRN Forest Gleason, MD      . heparin lock flush 100 unit/mL  500 Units Intravenous Once Forest Gleason, MD      . heparin lock flush 100 unit/mL  500 Units Intracatheter Once PRN Forest Gleason, MD      . heparin lock flush 100 unit/mL  250 Units Intracatheter Once PRN Forest Gleason, MD      . heparin lock flush 100 unit/mL  500 Units Intravenous Once Forest Gleason, MD      . sodium chloride 0.9 % injection 10 mL  10 mL Intracatheter PRN Forest Gleason, MD      . sodium chloride flush (NS) 0.9 % injection 10 mL  10 mL Intravenous PRN Forest Gleason, MD      . sodium chloride flush (NS) 0.9 % injection 10 mL  10 mL Intravenous PRN Forest Gleason, MD   10 mL at 02/01/16 1439  .  sodium chloride flush (NS) 0.9 % injection 10 mL  10 mL Intravenous PRN Forest Gleason, MD   10 mL at 02/29/16 0900    OBJECTIVE: PHYSICAL EXAM: General   status: Patient is alert oriented not any acute distress.   accompanied by her husband. She is walking herself. HEENT: No soreness in the mouth.  No evidence of candidiasis Lymphatic system: Palpable right supraclavicular lymph node small no other lymphadenopathy.  CVS: Heart regular rate and rhythm. No murmurs. Skin: No rash Lower extremity swelling has improved All other cranial NERVES   are intact Motor system equal on both sides without any significant loss of power Sensory system no localizing sign Cerebellar ataxia grade 1, improving Psychiatric system: Anxiety mild depression All other systems have been reviewed.  Filed Vitals:   02/29/16 0939  BP: 103/68  Pulse: 106  Temp: 97.3 F (36.3 C)  Resp: 18     Body mass index is 23.3 kg/(m^2).    ECOG FS:2 - Symptomatic, <50% confined to bed  LAB RESULTS:  Infusion on 02/29/2016  Component Date Value Ref Range Status  . WBC 02/29/2016 6.2  3.6 - 11.0 K/uL Final  . RBC 02/29/2016 2.84* 3.80 - 5.20 MIL/uL Final  . Hemoglobin 02/29/2016 8.9* 12.0 - 16.0 g/dL Final  . HCT 02/29/2016 26.3* 35.0 - 47.0 % Final  . MCV 02/29/2016 92.8  80.0 - 100.0 fL Final  . MCH 02/29/2016 31.5  26.0 - 34.0 pg Final  . MCHC 02/29/2016 34.0  32.0 - 36.0 g/dL Final  . RDW 02/29/2016 22.6* 11.5 - 14.5 % Final  .  Platelets 02/29/2016 174  150 - 440 K/uL Final  . Neutrophils Relative % 02/29/2016 53   Final  . Neutro Abs 02/29/2016 3.4  1.4 - 6.5 K/uL Final  . Lymphocytes Relative 02/29/2016 29   Final  . Lymphs Abs 02/29/2016 1.8  1.0 - 3.6 K/uL Final  . Monocytes Relative 02/29/2016 16   Final  . Monocytes Absolute 02/29/2016 1.0* 0.2 - 0.9 K/uL Final  . Eosinophils Relative 02/29/2016 1   Final  . Eosinophils Absolute 02/29/2016 0.0  0 - 0.7 K/uL Final  . Basophils Relative 02/29/2016 1   Final  . Basophils Absolute 02/29/2016 0.0  0 - 0.1 K/uL Final  . Sodium 02/29/2016 132* 135 - 145 mmol/L Final  . Potassium 02/29/2016 3.3* 3.5 - 5.1 mmol/L Final  . Chloride 02/29/2016 105  101 - 111 mmol/L Final  . CO2 02/29/2016 22  22 - 32 mmol/L Final  . Glucose, Bld 02/29/2016 195* 65 - 99 mg/dL Final  . BUN 02/29/2016 11  6 - 20 mg/dL Final  . Creatinine, Ser 02/29/2016 0.81  0.44 - 1.00 mg/dL Final  . Calcium 02/29/2016 8.8* 8.9 - 10.3 mg/dL Final  . Total Protein 02/29/2016 6.7  6.5 - 8.1 g/dL Final  . Albumin 02/29/2016 3.8  3.5 - 5.0 g/dL Final  . AST 02/29/2016 22  15 - 41 U/L Final  . ALT 02/29/2016 13* 14 - 54 U/L Final  . Alkaline Phosphatase 02/29/2016 55  38 - 126 U/L Final  . Total Bilirubin 02/29/2016 0.7  0.3 - 1.2 mg/dL Final  . GFR calc non Af Amer 02/29/2016 >60  >60 mL/min Final  . GFR calc Af Amer 02/29/2016 >60  >60 mL/min Final   Comment: (NOTE) The eGFR has been calculated using the CKD EPI equation. This calculation has not been validated in all clinical situations. eGFR's persistently <60 mL/min signify possible Chronic  Kidney Disease.   . Anion gap 02/29/2016 5  5 - 15 Final  . Magnesium 02/29/2016 1.8  1.7 - 2.4 mg/dL Final      ASSESSMENT:  # Extensive stage small cell lung cancer currently on palliative chemotherapy with carboplatin plus VP-16. Patient currently status post 3 cycles. Tolerated very well. Clinically no orders of progression.   # Proceed with  cycle #4 today. CBC within normal limits except for mild anemia. CMP within normal limits except for mild hypokalemia. We'll plan to get a CT scan of the chest abdomen pelvis with contrast after this cycle.  # Metastatic disease to the brain- status post radiation. No evidence of any obvious progression at this time. Also repeat an MRI of the brain in 2 weeks.  # Mild hypokalemia continue potassium supplementation at home.  # Mild dizziness likely orthostasis/ recommend increased fluid intake.  #  Patient will follow-up with Dr. Oliva Bustard in 3 weeks with the scans. Based upon the scans it will be decided if the patient needs 2 more cycles of the chemotherapy. She'll be set up for chemotherapy again 3 weeks. Continue weekly labs.    Cammie Sickle, MD   02/29/2016 10:12 AM

## 2016-03-01 ENCOUNTER — Inpatient Hospital Stay: Payer: BLUE CROSS/BLUE SHIELD

## 2016-03-01 DIAGNOSIS — R590 Localized enlarged lymph nodes: Secondary | ICD-10-CM

## 2016-03-01 DIAGNOSIS — C3412 Malignant neoplasm of upper lobe, left bronchus or lung: Secondary | ICD-10-CM | POA: Diagnosis not present

## 2016-03-01 DIAGNOSIS — C3432 Malignant neoplasm of lower lobe, left bronchus or lung: Secondary | ICD-10-CM

## 2016-03-01 MED ORDER — SODIUM CHLORIDE 0.9 % IV SOLN
Freq: Once | INTRAVENOUS | Status: AC
  Administered 2016-03-01: 14:00:00 via INTRAVENOUS
  Filled 2016-03-01: qty 4

## 2016-03-01 MED ORDER — ETOPOSIDE CHEMO INJECTION 1 GM/50ML
80.0000 mg/m2 | Freq: Once | INTRAVENOUS | Status: AC
  Administered 2016-03-01: 140 mg via INTRAVENOUS
  Filled 2016-03-01: qty 7

## 2016-03-01 MED ORDER — SODIUM CHLORIDE 0.9% FLUSH
10.0000 mL | INTRAVENOUS | Status: DC | PRN
  Administered 2016-03-01: 10 mL via INTRAVENOUS
  Filled 2016-03-01: qty 10

## 2016-03-01 MED ORDER — HEPARIN SOD (PORK) LOCK FLUSH 100 UNIT/ML IV SOLN
INTRAVENOUS | Status: AC
  Filled 2016-03-01: qty 5

## 2016-03-01 MED ORDER — HEPARIN SOD (PORK) LOCK FLUSH 100 UNIT/ML IV SOLN
500.0000 [IU] | Freq: Once | INTRAVENOUS | Status: AC
  Administered 2016-03-01: 500 [IU] via INTRAVENOUS

## 2016-03-01 MED ORDER — SODIUM CHLORIDE 0.9 % IV SOLN
Freq: Once | INTRAVENOUS | Status: AC
  Administered 2016-03-01: 14:00:00 via INTRAVENOUS
  Filled 2016-03-01: qty 1000

## 2016-03-02 ENCOUNTER — Inpatient Hospital Stay: Payer: BLUE CROSS/BLUE SHIELD

## 2016-03-02 VITALS — BP 99/66 | HR 84 | Temp 97.0°F

## 2016-03-02 DIAGNOSIS — C3432 Malignant neoplasm of lower lobe, left bronchus or lung: Secondary | ICD-10-CM

## 2016-03-02 DIAGNOSIS — R590 Localized enlarged lymph nodes: Secondary | ICD-10-CM

## 2016-03-02 DIAGNOSIS — C3412 Malignant neoplasm of upper lobe, left bronchus or lung: Secondary | ICD-10-CM | POA: Diagnosis not present

## 2016-03-02 MED ORDER — SODIUM CHLORIDE 0.9% FLUSH
10.0000 mL | Freq: Once | INTRAVENOUS | Status: AC
  Administered 2016-03-02: 10 mL via INTRAVENOUS
  Filled 2016-03-02: qty 10

## 2016-03-02 MED ORDER — HEPARIN SOD (PORK) LOCK FLUSH 100 UNIT/ML IV SOLN
500.0000 [IU] | Freq: Once | INTRAVENOUS | Status: AC
Start: 2016-03-02 — End: 2016-03-02
  Administered 2016-03-02: 500 [IU] via INTRAVENOUS
  Filled 2016-03-02: qty 5

## 2016-03-02 MED ORDER — ONDANSETRON HCL 40 MG/20ML IJ SOLN
Freq: Once | INTRAMUSCULAR | Status: AC
  Administered 2016-03-02: 14:00:00 via INTRAVENOUS
  Filled 2016-03-02: qty 4

## 2016-03-02 MED ORDER — SODIUM CHLORIDE 0.9 % IV SOLN
INTRAVENOUS | Status: DC
  Administered 2016-03-02: 14:00:00 via INTRAVENOUS
  Filled 2016-03-02: qty 1000

## 2016-03-02 MED ORDER — SODIUM CHLORIDE 0.9 % IV SOLN
80.0000 mg/m2 | Freq: Once | INTRAVENOUS | Status: AC
  Administered 2016-03-02: 140 mg via INTRAVENOUS
  Filled 2016-03-02: qty 7

## 2016-03-02 MED ORDER — PEGFILGRASTIM 6 MG/0.6ML ~~LOC~~ PSKT
6.0000 mg | PREFILLED_SYRINGE | Freq: Once | SUBCUTANEOUS | Status: AC
  Administered 2016-03-02: 6 mg via SUBCUTANEOUS

## 2016-03-07 ENCOUNTER — Inpatient Hospital Stay: Payer: BLUE CROSS/BLUE SHIELD

## 2016-03-07 DIAGNOSIS — C3432 Malignant neoplasm of lower lobe, left bronchus or lung: Secondary | ICD-10-CM

## 2016-03-07 DIAGNOSIS — C3412 Malignant neoplasm of upper lobe, left bronchus or lung: Secondary | ICD-10-CM | POA: Diagnosis not present

## 2016-03-07 LAB — BASIC METABOLIC PANEL
Anion gap: 8 (ref 5–15)
BUN: 21 mg/dL — AB (ref 6–20)
CO2: 23 mmol/L (ref 22–32)
CREATININE: 0.92 mg/dL (ref 0.44–1.00)
Calcium: 9.2 mg/dL (ref 8.9–10.3)
Chloride: 101 mmol/L (ref 101–111)
GFR calc Af Amer: >60 mL/min (ref >60)
GLUCOSE: 200 mg/dL — AB (ref 65–99)
Potassium: 3.8 mmol/L (ref 3.5–5.1)
SODIUM: 132 mmol/L — AB (ref 135–145)

## 2016-03-07 LAB — CBC WITH DIFFERENTIAL/PLATELET
Basophils Absolute: 0.1 10*3/uL (ref 0–0.1)
Basophils Relative: 0 %
EOS ABS: 0.1 10*3/uL (ref 0–0.7)
EOS PCT: 1 %
HCT: 23.2 % — ABNORMAL LOW (ref 35.0–47.0)
Hemoglobin: 7.7 g/dL — ABNORMAL LOW (ref 12.0–16.0)
LYMPHS ABS: 1.4 10*3/uL (ref 1.0–3.6)
LYMPHS PCT: 10 %
MCH: 31.3 pg (ref 26.0–34.0)
MCHC: 33.2 g/dL (ref 32.0–36.0)
MCV: 94.3 fL (ref 80.0–100.0)
MONO ABS: 0.4 10*3/uL (ref 0.2–0.9)
MONOS PCT: 3 %
Neutro Abs: 11.5 10*3/uL — ABNORMAL HIGH (ref 1.4–6.5)
Neutrophils Relative %: 86 %
PLATELETS: 147 10*3/uL — AB (ref 150–440)
RBC: 2.46 MIL/uL — ABNORMAL LOW (ref 3.80–5.20)
RDW: 22.2 % — ABNORMAL HIGH (ref 11.5–14.5)
WBC: 13.4 10*3/uL — ABNORMAL HIGH (ref 3.6–11.0)

## 2016-03-07 LAB — MAGNESIUM: Magnesium: 1.6 mg/dL — ABNORMAL LOW (ref 1.7–2.4)

## 2016-03-07 MED ORDER — MAGNESIUM CHLORIDE-CALCIUM 64-106 MG PO TBEC: 1.0000 | DELAYED_RELEASE_TABLET | Freq: Two times a day (BID) | ORAL | Status: DC

## 2016-03-13 ENCOUNTER — Ambulatory Visit
Admission: RE | Admit: 2016-03-13 | Discharge: 2016-03-13 | Disposition: A | Discharge status: Home / Self Care | Payer: BLUE CROSS/BLUE SHIELD | Source: Ambulatory Visit | Attending: Oncology | Admitting: Oncology

## 2016-03-13 ENCOUNTER — Ambulatory Visit: Admission: RE | Admit: 2016-03-13 | Payer: BLUE CROSS/BLUE SHIELD | Source: Ambulatory Visit

## 2016-03-13 DIAGNOSIS — C3432 Malignant neoplasm of lower lobe, left bronchus or lung: Secondary | ICD-10-CM | POA: Insufficient documentation

## 2016-03-13 DIAGNOSIS — C7931 Secondary malignant neoplasm of brain: Secondary | ICD-10-CM | POA: Diagnosis present

## 2016-03-13 DIAGNOSIS — I7090 Unspecified atherosclerosis: Secondary | ICD-10-CM | POA: Insufficient documentation

## 2016-03-13 DIAGNOSIS — J479 Bronchiectasis, uncomplicated: Secondary | ICD-10-CM | POA: Insufficient documentation

## 2016-03-13 MED ORDER — GADOBENATE DIMEGLUMINE 529 MG/ML IV SOLN
15.0000 mL | Freq: Once | INTRAVENOUS | Status: AC | PRN
  Administered 2016-03-13: 15 mL via INTRAVENOUS

## 2016-03-13 MED ORDER — IOPAMIDOL (ISOVUE-300) INJECTION 61%
100.0000 mL | Freq: Once | INTRAVENOUS | Status: AC | PRN
  Administered 2016-03-13: 85 mL via INTRAVENOUS

## 2016-03-14 ENCOUNTER — Inpatient Hospital Stay: Payer: BLUE CROSS/BLUE SHIELD

## 2016-03-14 DIAGNOSIS — C3432 Malignant neoplasm of lower lobe, left bronchus or lung: Secondary | ICD-10-CM

## 2016-03-14 DIAGNOSIS — C3412 Malignant neoplasm of upper lobe, left bronchus or lung: Secondary | ICD-10-CM | POA: Diagnosis not present

## 2016-03-14 LAB — BASIC METABOLIC PANEL
ANION GAP: 9 (ref 5–15)
BUN: 9 mg/dL (ref 6–20)
CALCIUM: 9.5 mg/dL (ref 8.9–10.3)
CO2: 24 mmol/L (ref 22–32)
CREATININE: 0.85 mg/dL (ref 0.44–1.00)
Chloride: 101 mmol/L (ref 101–111)
GFR calc non Af Amer: >60 mL/min (ref >60)
Glucose, Bld: 165 mg/dL — ABNORMAL HIGH (ref 65–99)
Potassium: 3.7 mmol/L (ref 3.5–5.1)
Sodium: 134 mmol/L — ABNORMAL LOW (ref 135–145)

## 2016-03-14 LAB — MAGNESIUM: Magnesium: 1.6 mg/dL — ABNORMAL LOW (ref 1.7–2.4)

## 2016-03-14 MED ORDER — SODIUM CHLORIDE 0.9 % IV SOLN
INTRAVENOUS | Status: DC
Start: 2016-03-14 — End: 2016-03-14
  Administered 2016-03-14: 15:00:00 via INTRAVENOUS
  Filled 2016-03-14: qty 1000

## 2016-03-14 MED ORDER — HEPARIN SOD (PORK) LOCK FLUSH 100 UNIT/ML IV SOLN
500.0000 [IU] | Freq: Once | INTRAVENOUS | Status: AC
  Administered 2016-03-14: 500 [IU] via INTRAVENOUS
  Filled 2016-03-14: qty 5

## 2016-03-14 MED ORDER — SODIUM CHLORIDE 0.9% FLUSH
10.0000 mL | INTRAVENOUS | Status: DC | PRN
  Administered 2016-03-14: 10 mL via INTRAVENOUS
  Filled 2016-03-14: qty 10

## 2016-03-14 MED ORDER — MAGNESIUM SULFATE 2 GM/50ML IV SOLN
2.0000 g | Freq: Once | INTRAVENOUS | Status: AC
  Administered 2016-03-14: 2 g via INTRAVENOUS

## 2016-03-14 MED ORDER — SODIUM CHLORIDE 0.9 % IV SOLN: 2.0000 g | Freq: Once | INTRAVENOUS | Status: DC

## 2016-03-21 ENCOUNTER — Inpatient Hospital Stay (HOSPITAL_BASED_OUTPATIENT_CLINIC_OR_DEPARTMENT_OTHER): Payer: BLUE CROSS/BLUE SHIELD | Admitting: Oncology

## 2016-03-21 ENCOUNTER — Inpatient Hospital Stay: Payer: BLUE CROSS/BLUE SHIELD

## 2016-03-21 ENCOUNTER — Encounter: Payer: Self-pay | Admitting: Oncology

## 2016-03-21 ENCOUNTER — Inpatient Hospital Stay: Payer: BLUE CROSS/BLUE SHIELD | Attending: Oncology

## 2016-03-21 ENCOUNTER — Encounter: Payer: Self-pay | Admitting: *Deleted

## 2016-03-21 VITALS — BP 97/66 | HR 112 | Temp 97.1°F | Resp 18 | Wt 138.2 lb

## 2016-03-21 DIAGNOSIS — Z801 Family history of malignant neoplasm of trachea, bronchus and lung: Secondary | ICD-10-CM | POA: Insufficient documentation

## 2016-03-21 DIAGNOSIS — Z9221 Personal history of antineoplastic chemotherapy: Secondary | ICD-10-CM

## 2016-03-21 DIAGNOSIS — R630 Anorexia: Secondary | ICD-10-CM | POA: Diagnosis not present

## 2016-03-21 DIAGNOSIS — C7970 Secondary malignant neoplasm of unspecified adrenal gland: Secondary | ICD-10-CM | POA: Insufficient documentation

## 2016-03-21 DIAGNOSIS — B370 Candidal stomatitis: Secondary | ICD-10-CM

## 2016-03-21 DIAGNOSIS — Z7984 Long term (current) use of oral hypoglycemic drugs: Secondary | ICD-10-CM | POA: Diagnosis not present

## 2016-03-21 DIAGNOSIS — E876 Hypokalemia: Secondary | ICD-10-CM

## 2016-03-21 DIAGNOSIS — E119 Type 2 diabetes mellitus without complications: Secondary | ICD-10-CM

## 2016-03-21 DIAGNOSIS — C3432 Malignant neoplasm of lower lobe, left bronchus or lung: Secondary | ICD-10-CM | POA: Insufficient documentation

## 2016-03-21 DIAGNOSIS — C7931 Secondary malignant neoplasm of brain: Secondary | ICD-10-CM | POA: Diagnosis not present

## 2016-03-21 DIAGNOSIS — R0602 Shortness of breath: Secondary | ICD-10-CM | POA: Insufficient documentation

## 2016-03-21 DIAGNOSIS — Z79899 Other long term (current) drug therapy: Secondary | ICD-10-CM

## 2016-03-21 DIAGNOSIS — Z923 Personal history of irradiation: Secondary | ICD-10-CM | POA: Diagnosis not present

## 2016-03-21 DIAGNOSIS — G3281 Cerebellar ataxia in diseases classified elsewhere: Secondary | ICD-10-CM

## 2016-03-21 DIAGNOSIS — Z87891 Personal history of nicotine dependence: Secondary | ICD-10-CM | POA: Insufficient documentation

## 2016-03-21 DIAGNOSIS — F418 Other specified anxiety disorders: Secondary | ICD-10-CM | POA: Diagnosis not present

## 2016-03-21 DIAGNOSIS — C7900 Secondary malignant neoplasm of unspecified kidney and renal pelvis: Secondary | ICD-10-CM | POA: Insufficient documentation

## 2016-03-21 DIAGNOSIS — M7989 Other specified soft tissue disorders: Secondary | ICD-10-CM

## 2016-03-21 DIAGNOSIS — R590 Localized enlarged lymph nodes: Secondary | ICD-10-CM

## 2016-03-21 DIAGNOSIS — R51 Headache: Secondary | ICD-10-CM | POA: Diagnosis not present

## 2016-03-21 DIAGNOSIS — R4781 Slurred speech: Secondary | ICD-10-CM | POA: Insufficient documentation

## 2016-03-21 DIAGNOSIS — C801 Malignant (primary) neoplasm, unspecified: Secondary | ICD-10-CM

## 2016-03-21 LAB — CBC WITH DIFFERENTIAL/PLATELET
Basophils Absolute: 0 10*3/uL (ref 0–0.1)
Basophils Relative: 0 %
Eosinophils Absolute: 0 10*3/uL (ref 0–0.7)
Eosinophils Relative: 1 %
HEMATOCRIT: 22.5 % — AB (ref 35.0–47.0)
HEMOGLOBIN: 7.6 g/dL — AB (ref 12.0–16.0)
LYMPHS ABS: 1.7 10*3/uL (ref 1.0–3.6)
LYMPHS PCT: 30 %
MCH: 33.3 pg (ref 26.0–34.0)
MCHC: 33.6 g/dL (ref 32.0–36.0)
MCV: 99.1 fL (ref 80.0–100.0)
MONO ABS: 0.9 10*3/uL (ref 0.2–0.9)
MONOS PCT: 16 %
NEUTROS ABS: 3 10*3/uL (ref 1.4–6.5)
NEUTROS PCT: 53 %
Platelets: 134 10*3/uL — ABNORMAL LOW (ref 150–440)
RBC: 2.27 MIL/uL — ABNORMAL LOW (ref 3.80–5.20)
RDW: 24 % — AB (ref 11.5–14.5)
WBC: 5.6 10*3/uL (ref 3.6–11.0)

## 2016-03-21 LAB — COMPREHENSIVE METABOLIC PANEL
ALK PHOS: 55 U/L (ref 38–126)
ALT: 14 U/L (ref 14–54)
ANION GAP: 9 (ref 5–15)
AST: 25 U/L (ref 15–41)
Albumin: 3.9 g/dL (ref 3.5–5.0)
BILIRUBIN TOTAL: 0.4 mg/dL (ref 0.3–1.2)
BUN: 10 mg/dL (ref 6–20)
CALCIUM: 9.5 mg/dL (ref 8.9–10.3)
CO2: 24 mmol/L (ref 22–32)
CREATININE: 0.9 mg/dL (ref 0.44–1.00)
Chloride: 102 mmol/L (ref 101–111)
GFR calc non Af Amer: >60 mL/min (ref >60)
GLUCOSE: 177 mg/dL — AB (ref 65–99)
Potassium: 3.8 mmol/L (ref 3.5–5.1)
Sodium: 135 mmol/L (ref 135–145)
TOTAL PROTEIN: 6.8 g/dL (ref 6.5–8.1)

## 2016-03-21 LAB — MAGNESIUM: Magnesium: 1.9 mg/dL (ref 1.7–2.4)

## 2016-03-21 MED ORDER — SODIUM CHLORIDE 0.9% FLUSH
10.0000 mL | INTRAVENOUS | Status: DC | PRN
  Administered 2016-03-21: 10 mL via INTRAVENOUS
  Filled 2016-03-21: qty 10

## 2016-03-21 MED ORDER — HEPARIN SOD (PORK) LOCK FLUSH 100 UNIT/ML IV SOLN
500.0000 [IU] | Freq: Once | INTRAVENOUS | Status: AC
  Administered 2016-03-21: 500 [IU] via INTRAVENOUS

## 2016-03-21 MED ORDER — HEPARIN SOD (PORK) LOCK FLUSH 100 UNIT/ML IV SOLN
INTRAVENOUS | Status: AC
  Filled 2016-03-21: qty 5

## 2016-03-21 NOTE — Progress Notes (Signed)
[place] @ [facility] [phone]  Fax:([phone]) [place]  Ardelle Haliburton OB: 1951/07/05  MR#: [id number]  HDQ#:[id number]  Patient Care Team: Marden Noble, MD as PCP - General (Internal Medicine)  CHIEF COMPLAINT: Oncology history Chief Complaint  Patient presents with  . Lung Cancer   1.   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 and treat mass adrenal mass.  Diagnosis in January of 2017.  Diagnosis by the right supraclavicular lymph node biopsy.. 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 patient has finished radiation therapy.  To brain  ( feb of 2017) 3, starting    Chemotherapy    Feb  6 th ,, 2017 with carboplatinum and VP-16.  4.Finished fourth cycle of chemotherapy on March 02, 2016 Repeat CT scan show significant response INTERVAL HISTORY:  65 year old lady came today for second cycle of chemotherapy. Appetite has been poor.  Patient had developed a fungal infection (candidiasis of mouth) and started on Diflucan.  Which helped. Patient has finished her 4 cycles of chemotherapy.  Repeat CT scan has been reviewed independently so significant response. 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 status: Patient is quite emotional.  Not in any acute distress. HEENT: Slurred speech.  Headache. Lungs: Increasing shortness of breath patient just quit smoking no hemoptysis or chest pain GI: No nausea no vomiting no diarrhea poor appetite Lower extremity no swelling Skin: No rash Neurological system and slurred speech.  No other focal signs Musculoskeletal system no bony pain GU: No dysuria or hematuria Ha s  Finished  radiation treatment  here to initiate chemotherapy with carboplatinum  and VP-16  As per HPI. Otherwise, a complete review of systems is negatve.  PAST MEDICAL HISTORY: Past Medical History  Diagnosis Date  . Diabetes mellitus without complication ([place])   . Cancer of lung ([place]) 12/05/2015    PAST SURGICAL HISTORY: Past Surgical History  Procedure Laterality Date  . Abdominal hysterectomy    . Peripheral vascular catheterization N/A 01/13/2016    Procedure: Glori Luis Cath Insertion;  Surgeon: Katha Cabal, MD;  Location: [place] [facility];  Service: Cardiovascular;  Laterality: N/A;    FAMILY HISTORYThere is no significant family history of breast cancer, ovarian cancer, colon cancer  History of lung cancer in family     ADVANCED DIRECTIVES:  Patient does not have any living will or healthcare power of attorney.  Information was given .  Available resources had been discussed.  We will follow-up on subsequent appointments regarding this issue  HEALTH MAINTENANCE: Social History  Substance Use Topics  . Smoking status: Former Smoker -- 1.00 packs/day for 49 years    Types: Cigarettes    Quit date: 11/19/2011  . Smokeless tobacco: None  . Alcohol Use: No      Allergies  Allergen Reactions  . Amoxicillin Other (See Comments)    Reaction:  Unknown   . Levofloxacin Other (See Comments)    Reaction:  Joint and muscle pain  Pt states that this medication made her unable to walk.     Current Outpatient Prescriptions  Medication Sig Dispense Refill  . ALPRAZolam (XANAX) 0.5 MG tablet Take 0.5 mg by mouth 3 (three)  times daily as needed for anxiety.     Marland Kitchen glimepiride (AMARYL) 2 MG tablet     . latanoprost (XALATAN) 0.005 % ophthalmic solution Place 1 drop into both eyes at bedtime.    . lidocaine-prilocaine (EMLA) cream Apply 1 application topically as needed. 30 g 3  . Magnesium Chloride-Calcium 64-106 MG TBEC Take 1 tablet by mouth 2 (two) times daily. 60 tablet 3  . Omega-3 Fatty Acids (FISH OIL) 1000 MG CAPS Take 2,000 mg by mouth 2  (two) times daily.     Marland Kitchen omeprazole (PRILOSEC) 20 MG capsule Take 20 mg by mouth 2 (two) times daily.     . potassium chloride SA (K-DUR,KLOR-CON) 20 MEQ tablet Take 1 tablet (20 mEq total) by mouth daily. 30 tablet 3  . prochlorperazine (COMPAZINE) 10 MG tablet Take 1 tablet (10 mg total) by mouth every 6 (six) hours as needed for nausea or vomiting. 30 tablet 3  . sitaGLIPtin (JANUVIA) 100 MG tablet Take 100 mg by mouth daily.     No current facility-administered medications for this visit.   Facility-Administered Medications Ordered in Other Visits  Medication Dose Route Frequency Provider Last Rate Last Dose  . alteplase (CATHFLO ACTIVASE) injection 2 mg  2 mg Intracatheter Once PRN Forest Gleason, MD      . heparin lock flush 100 unit/mL  500 Units Intravenous Once Forest Gleason, MD      . heparin lock flush 100 unit/mL  500 Units Intracatheter Once PRN Forest Gleason, MD      . heparin lock flush 100 unit/mL  250 Units Intracatheter Once PRN Forest Gleason, MD      . heparin lock flush 100 unit/mL  500 Units Intravenous Once Forest Gleason, MD      . sodium chloride 0.9 % injection 10 mL  10 mL Intracatheter PRN Forest Gleason, MD      . sodium chloride flush (NS) 0.9 % injection 10 mL  10 mL Intravenous PRN Forest Gleason, MD      . sodium chloride flush (NS) 0.9 % injection 10 mL  10 mL Intravenous PRN Forest Gleason, MD   10 mL at 02/01/16 1439  . sodium chloride flush (NS) 0.9 % injection 10 mL  10 mL Intravenous PRN Forest Gleason, MD   10 mL at 03/21/16 0924    OBJECTIVE: PHYSICAL EXAM: General   status: Patient is alert oriented not any acute distress HEENT: No soreness in the mouth.  No evidence of candidiasis Lymphatic system: Palpable right supraclavicular lymph node small no other lymphadenopathy Cardiac exam revealed the PMI to be normally situated and sized. The rhythm was regular and no extrasystoles were noted during several minutes of auscultation. The first and second heart sounds  were normal and physiologic splitting of the second heart sound was noted. There were no murmurs, rubs, clicks, or gallops. Examination of the chest was unremarkable. There were no bony deformities, no asymmetry, and no other abnormalities. Skin: No rash Lower extremity swelling has improved Neurological system: Higher functions patient has dysphagia. All other cranial NERVES   are intact Motor system equal on both sides without any significant loss of power Sensory system no localizing sign Cerebellar ataxia grade 1, improving Psychiatric system: Anxiety mild depression All other systems have been reviewed.  Filed Vitals:   03/21/16 1003  BP: 97/66  Pulse: 112  Temp: 97.1 F (36.2 C)  Resp: 18     Body mass index is 23 kg/(m^2).    ECOG FS:2 -  Symptomatic, <50% confined to bed  LAB RESULTS:  Infusion on 03/21/2016  Component Date Value Ref Range Status  . WBC 03/21/2016 5.6  3.6 - 11.0 K/uL Final  . RBC 03/21/2016 2.27* 3.80 - 5.20 MIL/uL Final  . Hemoglobin 03/21/2016 7.6* 12.0 - 16.0 g/dL Final  . HCT 03/21/2016 22.5* 35.0 - 47.0 % Final  . MCV 03/21/2016 99.1  80.0 - 100.0 fL Final  . MCH 03/21/2016 33.3  26.0 - 34.0 pg Final  . MCHC 03/21/2016 33.6  32.0 - 36.0 g/dL Final  . RDW 03/21/2016 24.0* 11.5 - 14.5 % Final  . Platelets 03/21/2016 134* 150 - 440 K/uL Final  . Neutrophils Relative % 03/21/2016 53   Final  . Neutro Abs 03/21/2016 3.0  1.4 - 6.5 K/uL Final  . Lymphocytes Relative 03/21/2016 30   Final  . Lymphs Abs 03/21/2016 1.7  1.0 - 3.6 K/uL Final  . Monocytes Relative 03/21/2016 16   Final  . Monocytes Absolute 03/21/2016 0.9  0.2 - 0.9 K/uL Final  . Eosinophils Relative 03/21/2016 1   Final  . Eosinophils Absolute 03/21/2016 0.0  0 - 0.7 K/uL Final  . Basophils Relative 03/21/2016 0   Final  . Basophils Absolute 03/21/2016 0.0  0 - 0.1 K/uL Final  . Magnesium 03/21/2016 1.9  1.7 - 2.4 mg/dL Final  . Sodium 03/21/2016 135  135 - 145 mmol/L Final  .  Potassium 03/21/2016 3.8  3.5 - 5.1 mmol/L Final  . Chloride 03/21/2016 102  101 - 111 mmol/L Final  . CO2 03/21/2016 24  22 - 32 mmol/L Final  . Glucose, Bld 03/21/2016 177* 65 - 99 mg/dL Final  . BUN 03/21/2016 10  6 - 20 mg/dL Final  . Creatinine, Ser 03/21/2016 0.90  0.44 - 1.00 mg/dL Final  . Calcium 03/21/2016 9.5  8.9 - 10.3 mg/dL Final  . Total Protein 03/21/2016 6.8  6.5 - 8.1 g/dL Final  . Albumin 03/21/2016 3.9  3.5 - 5.0 g/dL Final  . AST 03/21/2016 25  15 - 41 U/L Final  . ALT 03/21/2016 14  14 - 54 U/L Final  . Alkaline Phosphatase 03/21/2016 55  38 - 126 U/L Final  . Total Bilirubin 03/21/2016 0.4  0.3 - 1.2 mg/dL Final  . GFR calc non Af Amer 03/21/2016 >60  >60 mL/min Final  . GFR calc Af Amer 03/21/2016 >60  >60 mL/min Final   Comment: (NOTE) The eGFR has been calculated using the CKD EPI equation. This calculation has not been validated in all clinical situations. eGFR's persistently <60 mL/min signify possible Chronic Kidney Disease.   . Anion gap 03/21/2016 9  5 - 15 Final     STUDIES: Ct Chest W Contrast  03/13/2016  CLINICAL DATA:  65 year old female with history of lung cancer diagnosed in January 2017 status post chemotherapy (completed 2 weeks ago). Currently asymptomatic. Former smoker (quit 5 years ago). EXAM: CT CHEST AND ABDOMEN WITH CONTRAST TECHNIQUE: Multidetector CT imaging of the chest and abdomen was performed following the standard protocol during bolus administration of intravenous contrast. CONTRAST:  104m ISOVUE-300 IOPAMIDOL (ISOVUE-300) INJECTION 61% COMPARISON:  CT of the chest, abdomen and pelvis 11/24/2015. FINDINGS: CT CHEST FINDINGS Mediastinum/Lymph Nodes: Heart size is normal. Tiny amount of anterior pericardial fluid and/or thickening, unlikely to be of any hemodynamic significance at this time. No associated pericardial calcification. Right internal jugular single-lumen porta cath with tip terminating in the superior aspect of the right  atrium. Previously noted mediastinal and  hilar lymphadenopathy has significantly decreased compared to the prior study, indicative of a positive response to therapy. Specific examples include a 13 mm short axis high right paratracheal lymph node (previously 31 mm in short axis), and a 15 mm short axis subcarinal lymph node (previously 35 mm in short axis). Esophagus is unremarkable in appearance. No axillary lymphadenopathy. Lungs/Pleura: Previously noted central perihilar mass in the left lower lobe has significantly regressed. Today's study demonstrates a nodular area in the left lower lobe (image 28 of series 2) measuring 2.1 x 1.0 cm. It is uncertain whether or not this represents residual tumor, or is simply some endobronchial debris. There is also some soft tissue in the proximal left lower lobe bronchus (image 26 of series 2) measuring 9 x 13 mm, which could represent residual tumor or endobronchial debris. There has been some re-expansion of the left lower lobe, although there continues to be extensive postobstructive atelectasis and scarring, as well as some cylindrical and mild varicose bronchiectasis in the left lower lobe related to prior postobstructive pneumonia. No new suspicious appearing pulmonary nodules or masses are noted elsewhere in the lungs. No pleural effusions. Musculoskeletal/Soft Tissues: There are no aggressive appearing lytic or blastic lesions noted in the visualized portions of the skeleton. CT ABDOMEN FINDINGS Hepatobiliary: No suspicious cystic or solid hepatic lesions. No intra or extrahepatic biliary ductal dilatation. Gallbladder is normal in appearance. Pancreas: No pancreatic mass. No pancreatic ductal dilatation. No pancreatic or peripancreatic fluid or inflammatory changes. Spleen: Unremarkable. Adrenals/Urinary Tract: Previously noted left adrenal nodule appears smaller than the prior study, currently measuring 9 mm. Right adrenal gland and right kidney are normal in  appearance. Previously noted hypovascular mass in the upper pole of the left kidney is also smaller than the prior study, currently measuring 1.9 x 2.0 cm (previously 3.0 x 3.4 cm). No hydroureteronephrosis in the visualized portions of the abdomen. Stomach/Bowel: The appearance of the stomach is normal. No pathologic dilatation of the visualized portions of small bowel or colon. Vascular/Lymphatic: Extensive atherosclerosis throughout the abdominal vasculature, including bulky atheromatous plaque in the infrarenal abdominal aorta. No aneurysm or dissection noted. No lymphadenopathy noted in the abdomen. Other: No significant volume of ascites and no pneumoperitoneum in the visualized peritoneal cavity. Musculoskeletal: There are no aggressive appearing lytic or blastic lesions noted in the visualized portions of the skeleton. IMPRESSION: 1. Today's study demonstrates a positive response to therapy with significant regression of the primary left lower lobe mass, significant regression of mediastinal and right hilar lymphadenopathy, and decreased size of the previously noted left adrenal and left renal lesions (likely metastatic). No new metastatic disease is otherwise noted. 2. Resolution of previously noted postobstructive pneumonia/atelectasis, with extensive scarring and some bronchiectasis in the left lower lobe on today's examination. 3. Extensive atherosclerosis. 4. Additional incidental findings, as above. Electronically Signed   By: Vinnie Langton M.D.   On: 03/13/2016 14:27   Ct Abdomen W Contrast  03/13/2016  CLINICAL DATA:  65 year old female with history of lung cancer diagnosed in January 2017 status post chemotherapy (completed 2 weeks ago). Currently asymptomatic. Former smoker (quit 5 years ago). EXAM: CT CHEST AND ABDOMEN WITH CONTRAST TECHNIQUE: Multidetector CT imaging of the chest and abdomen was performed following the standard protocol during bolus administration of intravenous contrast.  CONTRAST:  56m ISOVUE-300 IOPAMIDOL (ISOVUE-300) INJECTION 61% COMPARISON:  CT of the chest, abdomen and pelvis 11/24/2015. FINDINGS: CT CHEST FINDINGS Mediastinum/Lymph Nodes: Heart size is normal. Tiny amount of anterior pericardial fluid and/or thickening,  unlikely to be of any hemodynamic significance at this time. No associated pericardial calcification. Right internal jugular single-lumen porta cath with tip terminating in the superior aspect of the right atrium. Previously noted mediastinal and hilar lymphadenopathy has significantly decreased compared to the prior study, indicative of a positive response to therapy. Specific examples include a 13 mm short axis high right paratracheal lymph node (previously 31 mm in short axis), and a 15 mm short axis subcarinal lymph node (previously 35 mm in short axis). Esophagus is unremarkable in appearance. No axillary lymphadenopathy. Lungs/Pleura: Previously noted central perihilar mass in the left lower lobe has significantly regressed. Today's study demonstrates a nodular area in the left lower lobe (image 28 of series 2) measuring 2.1 x 1.0 cm. It is uncertain whether or not this represents residual tumor, or is simply some endobronchial debris. There is also some soft tissue in the proximal left lower lobe bronchus (image 26 of series 2) measuring 9 x 13 mm, which could represent residual tumor or endobronchial debris. There has been some re-expansion of the left lower lobe, although there continues to be extensive postobstructive atelectasis and scarring, as well as some cylindrical and mild varicose bronchiectasis in the left lower lobe related to prior postobstructive pneumonia. No new suspicious appearing pulmonary nodules or masses are noted elsewhere in the lungs. No pleural effusions. Musculoskeletal/Soft Tissues: There are no aggressive appearing lytic or blastic lesions noted in the visualized portions of the skeleton. CT ABDOMEN FINDINGS Hepatobiliary:  No suspicious cystic or solid hepatic lesions. No intra or extrahepatic biliary ductal dilatation. Gallbladder is normal in appearance. Pancreas: No pancreatic mass. No pancreatic ductal dilatation. No pancreatic or peripancreatic fluid or inflammatory changes. Spleen: Unremarkable. Adrenals/Urinary Tract: Previously noted left adrenal nodule appears smaller than the prior study, currently measuring 9 mm. Right adrenal gland and right kidney are normal in appearance. Previously noted hypovascular mass in the upper pole of the left kidney is also smaller than the prior study, currently measuring 1.9 x 2.0 cm (previously 3.0 x 3.4 cm). No hydroureteronephrosis in the visualized portions of the abdomen. Stomach/Bowel: The appearance of the stomach is normal. No pathologic dilatation of the visualized portions of small bowel or colon. Vascular/Lymphatic: Extensive atherosclerosis throughout the abdominal vasculature, including bulky atheromatous plaque in the infrarenal abdominal aorta. No aneurysm or dissection noted. No lymphadenopathy noted in the abdomen. Other: No significant volume of ascites and no pneumoperitoneum in the visualized peritoneal cavity. Musculoskeletal: There are no aggressive appearing lytic or blastic lesions noted in the visualized portions of the skeleton. IMPRESSION: 1. Today's study demonstrates a positive response to therapy with significant regression of the primary left lower lobe mass, significant regression of mediastinal and right hilar lymphadenopathy, and decreased size of the previously noted left adrenal and left renal lesions (likely metastatic). No new metastatic disease is otherwise noted. 2. Resolution of previously noted postobstructive pneumonia/atelectasis, with extensive scarring and some bronchiectasis in the left lower lobe on today's examination. 3. Extensive atherosclerosis. 4. Additional incidental findings, as above. Electronically Signed   By: Vinnie Langton M.D.    On: 03/13/2016 14:27   Mr Jeri Cos YQ Contrast  03/13/2016  CLINICAL DATA:  65 year old female with small cell lung cancer diagnosed January 2017. Headaches. Subsequent encounter. EXAM: MRI HEAD WITHOUT AND WITH CONTRAST TECHNIQUE: Multiplanar, multiecho pulse sequences of the brain and surrounding structures were obtained without and with intravenous contrast. CONTRAST:  62m MULTIHANCE GADOBENATE DIMEGLUMINE 529 MG/ML IV SOLN COMPARISON:  11/23/2015. FINDINGS: No acute  thrombotic infarct. Right cerebellar hemorrhagic lesion has decreased in size now measuring 1.8 x 2.9 x 1.6 cm versus prior 2.5 x 3.6 x 2.2 cm. Significant improvement in the surrounding vasogenic edema and previously noted local mass effect. Right occipital lobe 1.5 cm enhancing lesion has almost completely resolved with tiny area of enhancement remaining (coronal images). Resolution surrounding vasogenic edema. Right parietal lobe 1.8 cm enhancing lesion almost completely resolved with minimal enhancement remaining at this level. Resolution of surrounding vasogenic edema. Medial posterior right frontal lobe subtle sub cm enhancement less conspicuous than on the prior exam. Previously question subtle right occipital lobe enhancing lesion not appreciated on present exam. Question tiny new area of enhancement right frontal (series 12, image 10) not confirmed on axial four coronal imaging. Attention to this on follow-up. Prominent number of scattered patchy white matter changes have progressed slightly since prior examination and may reflect result of small vessel disease/ treatment of tumor. Major intracranial vascular structures are patent. Minimal partial opacification mastoid air cells without obstructing lesion of eustachian tube. Minimal paranasal sinus mucosal thickening. Post lens replacement otherwise orbital structures unremarkable. Cerebellar tonsils do not appear is low lying as on the prior exam which may reflect improvement in the size  of cerebellar hemorrhagic metastatic lesion. IMPRESSION: Overall majority of lesions appear to have responded to treatment including: Right cerebellar hemorrhagic lesion has decreased in size now measuring 1.8 x 2.9 x 1.6 cm versus prior 2.5 x 3.6 x 2.2 cm. Significant improvement in the surrounding vasogenic edema and previously noted local mass effect. Right occipital lobe 1.5 cm enhancing lesion has almost completely resolved with tiny area of enhancement remaining (coronal images). Resolution surrounding vasogenic edema. Right parietal lobe 1.8 cm enhancing lesion almost completely resolved with minimal enhancement remaining at this level. Resolution of surrounding vasogenic edema. Medial posterior right frontal lobe subtle sub cm enhancement less conspicuous than on the prior exam. Previously question subtle right occipital lobe enhancing lesion not appreciated on present exam. Question tiny new area of enhancement right frontal (series 12, image 10) not confirmed on axial four coronal imaging. Attention to this on follow-up. Prominent number of scattered patchy white matter changes have progressed slightly since prior examination and may reflect result of small vessel disease/ treatment of tumor. Electronically Signed   By: Genia Del M.D.   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  Both mediastinal lymph node.  Ms. and treatment mass.  Probably metastases to the kidney. Adrenal metastases 2. Has finished radiation therapy to the brain (February, 2017)  3.  Has finished 4 cycles of chemotherapy in April of 2017 CT scan of the chest and MRI scan of brain has been reviewed independently and reviewed with the patient.  Has significant response  .1.Discuss with the patient in detail following options that includes  2. Observation: Significant chance of tumor progressing Consolidation  treatment  with a few more cycles of chemotherapy patient does have increasing side effects with the treatment. 3.  Available study with placebo versus monoclonal antibody had been discussed all the information was given to the patient in return appointment has been set up for next Wednesday to discuss the study and questions  Our nurse navigator also met her regarding study  Total duration of visit was 45 minutes.  50% or more time was spent in counseling patient and family regarding prognosis and options of treatment and available  resources  Patient expressed understanding and was in agreement with this plan. She also understands that She can call clinic at any time with any questions, concerns, or complaints.    Cancer of lower lobe of left lung ([place])   Staging form: Lung, AJCC 7th Edition     Clinical: T2, N3, M1 - Signed by Forest Gleason, MD on 12/05/2015   Forest Gleason, MD   03/21/2016 10:29 AM

## 2016-03-22 ENCOUNTER — Inpatient Hospital Stay: Payer: BLUE CROSS/BLUE SHIELD

## 2016-03-23 ENCOUNTER — Inpatient Hospital Stay: Payer: BLUE CROSS/BLUE SHIELD

## 2016-03-25 ENCOUNTER — Encounter: Payer: Self-pay | Admitting: Oncology

## 2016-03-28 ENCOUNTER — Inpatient Hospital Stay (HOSPITAL_BASED_OUTPATIENT_CLINIC_OR_DEPARTMENT_OTHER): Payer: BLUE CROSS/BLUE SHIELD | Admitting: Oncology

## 2016-03-28 VITALS — BP 103/73 | HR 92 | Temp 97.5°F | Resp 18 | Wt 137.8 lb

## 2016-03-28 DIAGNOSIS — F418 Other specified anxiety disorders: Secondary | ICD-10-CM

## 2016-03-28 DIAGNOSIS — R0602 Shortness of breath: Secondary | ICD-10-CM

## 2016-03-28 DIAGNOSIS — C3432 Malignant neoplasm of lower lobe, left bronchus or lung: Secondary | ICD-10-CM | POA: Diagnosis not present

## 2016-03-28 DIAGNOSIS — E119 Type 2 diabetes mellitus without complications: Secondary | ICD-10-CM

## 2016-03-28 DIAGNOSIS — C7931 Secondary malignant neoplasm of brain: Secondary | ICD-10-CM | POA: Diagnosis not present

## 2016-03-28 DIAGNOSIS — Z79899 Other long term (current) drug therapy: Secondary | ICD-10-CM

## 2016-03-28 DIAGNOSIS — C7970 Secondary malignant neoplasm of unspecified adrenal gland: Secondary | ICD-10-CM | POA: Diagnosis not present

## 2016-03-28 DIAGNOSIS — C7900 Secondary malignant neoplasm of unspecified kidney and renal pelvis: Secondary | ICD-10-CM

## 2016-03-28 DIAGNOSIS — Z7984 Long term (current) use of oral hypoglycemic drugs: Secondary | ICD-10-CM

## 2016-03-28 DIAGNOSIS — G3281 Cerebellar ataxia in diseases classified elsewhere: Secondary | ICD-10-CM

## 2016-03-28 DIAGNOSIS — Z87891 Personal history of nicotine dependence: Secondary | ICD-10-CM

## 2016-03-28 DIAGNOSIS — M7989 Other specified soft tissue disorders: Secondary | ICD-10-CM

## 2016-03-28 DIAGNOSIS — R51 Headache: Secondary | ICD-10-CM

## 2016-03-28 DIAGNOSIS — R630 Anorexia: Secondary | ICD-10-CM

## 2016-03-28 DIAGNOSIS — Z801 Family history of malignant neoplasm of trachea, bronchus and lung: Secondary | ICD-10-CM

## 2016-03-28 DIAGNOSIS — Z923 Personal history of irradiation: Secondary | ICD-10-CM

## 2016-03-28 DIAGNOSIS — Z9221 Personal history of antineoplastic chemotherapy: Secondary | ICD-10-CM

## 2016-03-28 DIAGNOSIS — R4781 Slurred speech: Secondary | ICD-10-CM

## 2016-03-28 DIAGNOSIS — B370 Candidal stomatitis: Secondary | ICD-10-CM

## 2016-03-28 NOTE — Progress Notes (Signed)
Patient requesting review of last MRI and CT scan.  Patient not interested in clinical trial.

## 2016-03-31 ENCOUNTER — Encounter: Payer: Self-pay | Admitting: Oncology

## 2016-03-31 NOTE — Progress Notes (Signed)
[place] @ [facility] [phone]  [phone] [place]  Roxanna Mcever OB: 04/21/1951  MR#: [id number]  KDX#:[id number]  Patient Care Team: Marden Noble, MD as PCP - General (Internal Medicine)  CHIEF COMPLAINT: Oncology history Chief Complaint  Patient presents with  . Cancer of lower lobe of left lung   1.   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 and treat mass adrenal mass.  Diagnosis in January of 2017.  Diagnosis by the right supraclavicular lymph node biopsy.. 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 patient has finished radiation therapy.  To brain  ( feb of 2017) 3, starting    Chemotherapy    Feb  6 th ,, 2017 with carboplatinum and VP-16.  4.Finished fourth cycle of chemotherapy on March 02, 2016 Repeat CT scan show significant response INTERVAL HISTORY:  65 year old lady came today for second cycle of chemotherapy. Appetite has been poor.  Patient had developed a fungal infection (candidiasis of mouth) and started on Diflucan.  Which helped. Patient has finished her 4 cycles of chemotherapy.  Repeat CT scan has been reviewed independently so significant response. She is here to discuss further options of therapy Off shortness of breath.  Patient wanted to review CT scan.  We all the information was given about investigational trial for maintenance study patient number of questions.  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 status: Patient is quite emotional.  Not in any acute distress. HEENT: Slurred speech.  Headache. Lungs: Increasing shortness of breath patient just quit smoking no hemoptysis or chest pain GI: No nausea no vomiting no diarrhea poor appetite Lower extremity no swelling Skin: No rash Neurological system and slurred speech.   No other focal signs Musculoskeletal system no bony pain GU: No dysuria or hematuria Ha s  Finished  radiation treatment  here to initiate chemotherapy with carboplatinum and VP-16  As per HPI. Otherwise, a complete review of systems is negatve.  PAST MEDICAL HISTORY: Past Medical History  Diagnosis Date  . Diabetes mellitus without complication ([place])   . Cancer of lung ([place]) 12/05/2015    PAST SURGICAL HISTORY: Past Surgical History  Procedure Laterality Date  . Abdominal hysterectomy    . Peripheral vascular catheterization N/A 01/13/2016    Procedure: Glori Luis Cath Insertion;  Surgeon: Katha Cabal, MD;  Location: [facility];  Service: Cardiovascular;  Laterality: N/A;    FAMILY HISTORYThere is no significant family history of breast cancer, ovarian cancer, colon cancer  History of lung cancer in family     ADVANCED DIRECTIVES:  Patient does not have any living will or healthcare power of attorney.  Information was given .  Available resources had been discussed.  We will follow-up on subsequent appointments regarding this issue  HEALTH MAINTENANCE: Social History  Substance Use Topics  . Smoking status: Former Smoker -- 1.00 packs/day for 49 years    Types: Cigarettes    Quit date: 11/19/2011  . Smokeless tobacco: None  . Alcohol Use: No      Allergies  Allergen Reactions  . Amoxicillin Other (See Comments)    Reaction:  Unknown   . Levofloxacin Other (See Comments)    Reaction:  Joint and muscle pain  Pt states that this medication made her  unable to walk.     Current Outpatient Prescriptions  Medication Sig Dispense Refill  . ALPRAZolam (XANAX) 0.5 MG tablet Take 0.5 mg by mouth 3 (three) times daily as needed for anxiety.     Marland Kitchen glimepiride (AMARYL) 2 MG tablet     . latanoprost (XALATAN) 0.005 % ophthalmic solution Place 1 drop into both eyes at bedtime.    . lidocaine-prilocaine (EMLA) cream Apply 1 application topically as needed. 30 g 3  .  Omega-3 Fatty Acids (FISH OIL) 1000 MG CAPS Take 2,000 mg by mouth 2 (two) times daily.     Marland Kitchen omeprazole (PRILOSEC) 20 MG capsule Take 20 mg by mouth 2 (two) times daily.     . prochlorperazine (COMPAZINE) 10 MG tablet Take 1 tablet (10 mg total) by mouth every 6 (six) hours as needed for nausea or vomiting. 30 tablet 3  . sitaGLIPtin (JANUVIA) 100 MG tablet Take 100 mg by mouth daily.    Marland Kitchen KLOR-CON M20 20 MEQ tablet      No current facility-administered medications for this visit.   Facility-Administered Medications Ordered in Other Visits  Medication Dose Route Frequency Provider Last Rate Last Dose  . alteplase (CATHFLO ACTIVASE) injection 2 mg  2 mg Intracatheter Once PRN Forest Gleason, MD      . heparin lock flush 100 unit/mL  500 Units Intravenous Once Forest Gleason, MD      . heparin lock flush 100 unit/mL  500 Units Intracatheter Once PRN Forest Gleason, MD      . heparin lock flush 100 unit/mL  250 Units Intracatheter Once PRN Forest Gleason, MD      . sodium chloride 0.9 % injection 10 mL  10 mL Intracatheter PRN Forest Gleason, MD      . sodium chloride flush (NS) 0.9 % injection 10 mL  10 mL Intravenous PRN Forest Gleason, MD      . sodium chloride flush (NS) 0.9 % injection 10 mL  10 mL Intravenous PRN Forest Gleason, MD   10 mL at 02/01/16 1439    OBJECTIVE: PHYSICAL EXAM: General   status: Patient is alert oriented not any acute distress HEENT: No soreness in the mouth.  No evidence of candidiasis Lymphatic system: Palpable right supraclavicular lymph node small no other lymphadenopathy Cardiac exam revealed the PMI to be normally situated and sized. The rhythm was regular and no extrasystoles were noted during several minutes of auscultation. The first and second heart sounds were normal and physiologic splitting of the second heart sound was noted. There were no murmurs, rubs, clicks, or gallops. Examination of the chest was unremarkable. There were no bony deformities, no asymmetry,  and no other abnormalities. Skin: No rash Lower extremity swelling has improved Neurological system: Higher functions patient has dysphagia. All other cranial NERVES   are intact Motor system equal on both sides without any significant loss of power Sensory system no localizing sign Cerebellar ataxia grade 1, improving Psychiatric system: Anxiety mild depression All other systems have been reviewed.  Filed Vitals:   03/28/16 1522  BP: 103/73  Pulse: 92  Temp: 97.5 F (36.4 C)  Resp: 18     Body mass index is 22.93 kg/(m^2).    ECOG FS:2 - Symptomatic, <50% confined to bed  LAB RESULTS:  No visits with results within 5 Day(s) from this visit. Latest known visit with results is:  Infusion on 03/21/2016  Component Date Value Ref Range Status  . WBC 03/21/2016 5.6  3.6 - 11.0  K/uL Final  . RBC 03/21/2016 2.27* 3.80 - 5.20 MIL/uL Final  . Hemoglobin 03/21/2016 7.6* 12.0 - 16.0 g/dL Final  . HCT 03/21/2016 22.5* 35.0 - 47.0 % Final  . MCV 03/21/2016 99.1  80.0 - 100.0 fL Final  . MCH 03/21/2016 33.3  26.0 - 34.0 pg Final  . MCHC 03/21/2016 33.6  32.0 - 36.0 g/dL Final  . RDW 03/21/2016 24.0* 11.5 - 14.5 % Final  . Platelets 03/21/2016 134* 150 - 440 K/uL Final  . Neutrophils Relative % 03/21/2016 53   Final  . Neutro Abs 03/21/2016 3.0  1.4 - 6.5 K/uL Final  . Lymphocytes Relative 03/21/2016 30   Final  . Lymphs Abs 03/21/2016 1.7  1.0 - 3.6 K/uL Final  . Monocytes Relative 03/21/2016 16   Final  . Monocytes Absolute 03/21/2016 0.9  0.2 - 0.9 K/uL Final  . Eosinophils Relative 03/21/2016 1   Final  . Eosinophils Absolute 03/21/2016 0.0  0 - 0.7 K/uL Final  . Basophils Relative 03/21/2016 0   Final  . Basophils Absolute 03/21/2016 0.0  0 - 0.1 K/uL Final  . Magnesium 03/21/2016 1.9  1.7 - 2.4 mg/dL Final  . Sodium 03/21/2016 135  135 - 145 mmol/L Final  . Potassium 03/21/2016 3.8  3.5 - 5.1 mmol/L Final  . Chloride 03/21/2016 102  101 - 111 mmol/L Final  . CO2 03/21/2016 24   22 - 32 mmol/L Final  . Glucose, Bld 03/21/2016 177* 65 - 99 mg/dL Final  . BUN 03/21/2016 10  6 - 20 mg/dL Final  . Creatinine, Ser 03/21/2016 0.90  0.44 - 1.00 mg/dL Final  . Calcium 03/21/2016 9.5  8.9 - 10.3 mg/dL Final  . Total Protein 03/21/2016 6.8  6.5 - 8.1 g/dL Final  . Albumin 03/21/2016 3.9  3.5 - 5.0 g/dL Final  . AST 03/21/2016 25  15 - 41 U/L Final  . ALT 03/21/2016 14  14 - 54 U/L Final  . Alkaline Phosphatase 03/21/2016 55  38 - 126 U/L Final  . Total Bilirubin 03/21/2016 0.4  0.3 - 1.2 mg/dL Final  . GFR calc non Af Amer 03/21/2016 >60  >60 mL/min Final  . GFR calc Af Amer 03/21/2016 >60  >60 mL/min Final   Comment: (NOTE) The eGFR has been calculated using the CKD EPI equation. This calculation has not been validated in all clinical situations. eGFR's persistently <60 mL/min signify possible Chronic Kidney Disease.   . Anion gap 03/21/2016 9  5 - 15 Final     STUDIES: Ct Chest W Contrast  03/13/2016  CLINICAL DATA:  65 year old female with history of lung cancer diagnosed in January 2017 status post chemotherapy (completed 2 weeks ago). Currently asymptomatic. Former smoker (quit 5 years ago). EXAM: CT CHEST AND ABDOMEN WITH CONTRAST TECHNIQUE: Multidetector CT imaging of the chest and abdomen was performed following the standard protocol during bolus administration of intravenous contrast. CONTRAST:  34m ISOVUE-300 IOPAMIDOL (ISOVUE-300) INJECTION 61% COMPARISON:  CT of the chest, abdomen and pelvis 11/24/2015. FINDINGS: CT CHEST FINDINGS Mediastinum/Lymph Nodes: Heart size is normal. Tiny amount of anterior pericardial fluid and/or thickening, unlikely to be of any hemodynamic significance at this time. No associated pericardial calcification. Right internal jugular single-lumen porta cath with tip terminating in the superior aspect of the right atrium. Previously noted mediastinal and hilar lymphadenopathy has significantly decreased compared to the prior study,  indicative of a positive response to therapy. Specific examples include a 13 mm short axis high right paratracheal  lymph node (previously 31 mm in short axis), and a 15 mm short axis subcarinal lymph node (previously 35 mm in short axis). Esophagus is unremarkable in appearance. No axillary lymphadenopathy. Lungs/Pleura: Previously noted central perihilar mass in the left lower lobe has significantly regressed. Today's study demonstrates a nodular area in the left lower lobe (image 28 of series 2) measuring 2.1 x 1.0 cm. It is uncertain whether or not this represents residual tumor, or is simply some endobronchial debris. There is also some soft tissue in the proximal left lower lobe bronchus (image 26 of series 2) measuring 9 x 13 mm, which could represent residual tumor or endobronchial debris. There has been some re-expansion of the left lower lobe, although there continues to be extensive postobstructive atelectasis and scarring, as well as some cylindrical and mild varicose bronchiectasis in the left lower lobe related to prior postobstructive pneumonia. No new suspicious appearing pulmonary nodules or masses are noted elsewhere in the lungs. No pleural effusions. Musculoskeletal/Soft Tissues: There are no aggressive appearing lytic or blastic lesions noted in the visualized portions of the skeleton. CT ABDOMEN FINDINGS Hepatobiliary: No suspicious cystic or solid hepatic lesions. No intra or extrahepatic biliary ductal dilatation. Gallbladder is normal in appearance. Pancreas: No pancreatic mass. No pancreatic ductal dilatation. No pancreatic or peripancreatic fluid or inflammatory changes. Spleen: Unremarkable. Adrenals/Urinary Tract: Previously noted left adrenal nodule appears smaller than the prior study, currently measuring 9 mm. Right adrenal gland and right kidney are normal in appearance. Previously noted hypovascular mass in the upper pole of the left kidney is also smaller than the prior study,  currently measuring 1.9 x 2.0 cm (previously 3.0 x 3.4 cm). No hydroureteronephrosis in the visualized portions of the abdomen. Stomach/Bowel: The appearance of the stomach is normal. No pathologic dilatation of the visualized portions of small bowel or colon. Vascular/Lymphatic: Extensive atherosclerosis throughout the abdominal vasculature, including bulky atheromatous plaque in the infrarenal abdominal aorta. No aneurysm or dissection noted. No lymphadenopathy noted in the abdomen. Other: No significant volume of ascites and no pneumoperitoneum in the visualized peritoneal cavity. Musculoskeletal: There are no aggressive appearing lytic or blastic lesions noted in the visualized portions of the skeleton. IMPRESSION: 1. Today's study demonstrates a positive response to therapy with significant regression of the primary left lower lobe mass, significant regression of mediastinal and right hilar lymphadenopathy, and decreased size of the previously noted left adrenal and left renal lesions (likely metastatic). No new metastatic disease is otherwise noted. 2. Resolution of previously noted postobstructive pneumonia/atelectasis, with extensive scarring and some bronchiectasis in the left lower lobe on today's examination. 3. Extensive atherosclerosis. 4. Additional incidental findings, as above. Electronically Signed   By: Vinnie Langton M.D.   On: 03/13/2016 14:27   Ct Abdomen W Contrast  03/13/2016  CLINICAL DATA:  65 year old female with history of lung cancer diagnosed in January 2017 status post chemotherapy (completed 2 weeks ago). Currently asymptomatic. Former smoker (quit 5 years ago). EXAM: CT CHEST AND ABDOMEN WITH CONTRAST TECHNIQUE: Multidetector CT imaging of the chest and abdomen was performed following the standard protocol during bolus administration of intravenous contrast. CONTRAST:  25m ISOVUE-300 IOPAMIDOL (ISOVUE-300) INJECTION 61% COMPARISON:  CT of the chest, abdomen and pelvis  11/24/2015. FINDINGS: CT CHEST FINDINGS Mediastinum/Lymph Nodes: Heart size is normal. Tiny amount of anterior pericardial fluid and/or thickening, unlikely to be of any hemodynamic significance at this time. No associated pericardial calcification. Right internal jugular single-lumen porta cath with tip terminating in the superior aspect of  the right atrium. Previously noted mediastinal and hilar lymphadenopathy has significantly decreased compared to the prior study, indicative of a positive response to therapy. Specific examples include a 13 mm short axis high right paratracheal lymph node (previously 31 mm in short axis), and a 15 mm short axis subcarinal lymph node (previously 35 mm in short axis). Esophagus is unremarkable in appearance. No axillary lymphadenopathy. Lungs/Pleura: Previously noted central perihilar mass in the left lower lobe has significantly regressed. Today's study demonstrates a nodular area in the left lower lobe (image 28 of series 2) measuring 2.1 x 1.0 cm. It is uncertain whether or not this represents residual tumor, or is simply some endobronchial debris. There is also some soft tissue in the proximal left lower lobe bronchus (image 26 of series 2) measuring 9 x 13 mm, which could represent residual tumor or endobronchial debris. There has been some re-expansion of the left lower lobe, although there continues to be extensive postobstructive atelectasis and scarring, as well as some cylindrical and mild varicose bronchiectasis in the left lower lobe related to prior postobstructive pneumonia. No new suspicious appearing pulmonary nodules or masses are noted elsewhere in the lungs. No pleural effusions. Musculoskeletal/Soft Tissues: There are no aggressive appearing lytic or blastic lesions noted in the visualized portions of the skeleton. CT ABDOMEN FINDINGS Hepatobiliary: No suspicious cystic or solid hepatic lesions. No intra or extrahepatic biliary ductal dilatation. Gallbladder is  normal in appearance. Pancreas: No pancreatic mass. No pancreatic ductal dilatation. No pancreatic or peripancreatic fluid or inflammatory changes. Spleen: Unremarkable. Adrenals/Urinary Tract: Previously noted left adrenal nodule appears smaller than the prior study, currently measuring 9 mm. Right adrenal gland and right kidney are normal in appearance. Previously noted hypovascular mass in the upper pole of the left kidney is also smaller than the prior study, currently measuring 1.9 x 2.0 cm (previously 3.0 x 3.4 cm). No hydroureteronephrosis in the visualized portions of the abdomen. Stomach/Bowel: The appearance of the stomach is normal. No pathologic dilatation of the visualized portions of small bowel or colon. Vascular/Lymphatic: Extensive atherosclerosis throughout the abdominal vasculature, including bulky atheromatous plaque in the infrarenal abdominal aorta. No aneurysm or dissection noted. No lymphadenopathy noted in the abdomen. Other: No significant volume of ascites and no pneumoperitoneum in the visualized peritoneal cavity. Musculoskeletal: There are no aggressive appearing lytic or blastic lesions noted in the visualized portions of the skeleton. IMPRESSION: 1. Today's study demonstrates a positive response to therapy with significant regression of the primary left lower lobe mass, significant regression of mediastinal and right hilar lymphadenopathy, and decreased size of the previously noted left adrenal and left renal lesions (likely metastatic). No new metastatic disease is otherwise noted. 2. Resolution of previously noted postobstructive pneumonia/atelectasis, with extensive scarring and some bronchiectasis in the left lower lobe on today's examination. 3. Extensive atherosclerosis. 4. Additional incidental findings, as above. Electronically Signed   By: Vinnie Langton M.D.   On: 03/13/2016 14:27   Mr Jeri Cos DP Contrast  03/13/2016  CLINICAL DATA:  65 year old female with small cell  lung cancer diagnosed January 2017. Headaches. Subsequent encounter. EXAM: MRI HEAD WITHOUT AND WITH CONTRAST TECHNIQUE: Multiplanar, multiecho pulse sequences of the brain and surrounding structures were obtained without and with intravenous contrast. CONTRAST:  70m MULTIHANCE GADOBENATE DIMEGLUMINE 529 MG/ML IV SOLN COMPARISON:  11/23/2015. FINDINGS: No acute thrombotic infarct. Right cerebellar hemorrhagic lesion has decreased in size now measuring 1.8 x 2.9 x 1.6 cm versus prior 2.5 x 3.6 x 2.2 cm. Significant improvement  in the surrounding vasogenic edema and previously noted local mass effect. Right occipital lobe 1.5 cm enhancing lesion has almost completely resolved with tiny area of enhancement remaining (coronal images). Resolution surrounding vasogenic edema. Right parietal lobe 1.8 cm enhancing lesion almost completely resolved with minimal enhancement remaining at this level. Resolution of surrounding vasogenic edema. Medial posterior right frontal lobe subtle sub cm enhancement less conspicuous than on the prior exam. Previously question subtle right occipital lobe enhancing lesion not appreciated on present exam. Question tiny new area of enhancement right frontal (series 12, image 10) not confirmed on axial four coronal imaging. Attention to this on follow-up. Prominent number of scattered patchy white matter changes have progressed slightly since prior examination and may reflect result of small vessel disease/ treatment of tumor. Major intracranial vascular structures are patent. Minimal partial opacification mastoid air cells without obstructing lesion of eustachian tube. Minimal paranasal sinus mucosal thickening. Post lens replacement otherwise orbital structures unremarkable. Cerebellar tonsils do not appear is low lying as on the prior exam which may reflect improvement in the size of cerebellar hemorrhagic metastatic lesion. IMPRESSION: Overall majority of lesions appear to have responded to  treatment including: Right cerebellar hemorrhagic lesion has decreased in size now measuring 1.8 x 2.9 x 1.6 cm versus prior 2.5 x 3.6 x 2.2 cm. Significant improvement in the surrounding vasogenic edema and previously noted local mass effect. Right occipital lobe 1.5 cm enhancing lesion has almost completely resolved with tiny area of enhancement remaining (coronal images). Resolution surrounding vasogenic edema. Right parietal lobe 1.8 cm enhancing lesion almost completely resolved with minimal enhancement remaining at this level. Resolution of surrounding vasogenic edema. Medial posterior right frontal lobe subtle sub cm enhancement less conspicuous than on the prior exam. Previously question subtle right occipital lobe enhancing lesion not appreciated on present exam. Question tiny new area of enhancement right frontal (series 12, image 10) not confirmed on axial four coronal imaging. Attention to this on follow-up. Prominent number of scattered patchy white matter changes have progressed slightly since prior examination and may reflect result of small vessel disease/ treatment of tumor. Electronically Signed   By: Genia Del M.D.   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  Both mediastinal lymph node.  Ms. and treatment mass.  Probably metastases to the kidney. Adrenal metastases 2. Has finished radiation therapy to the brain (February, 2017)  3.  Has finished 4 cycles of chemotherapy in April of 2017 CT scan of the chest and MRI scan of brain has been reviewed independently and reviewed with the patient.  Has significant response  .1.Discuss with the patient in detail following options that includes  2. Observation: Significant chance of tumor progressing Consolidation  treatment with a few more cycles of chemotherapy patient does have increasing side effects with the treatment. 3.  Available  study with placebo versus monoclonal antibody had been discussed all the information was given to the patient in return appointment has been set up for next Wednesday to discuss the study and questions  Patient   HAD  number of question  regarding study which were answered patient has not decided about to participate in study or not. SHE was informed about the goal of 2-9 weeks after the last chemotherapy Observation would be recommended at present time the patient does not want to participate in the study Upon progression possibility of resuming chemotherapy can be considered.  Total duration of visit was 45 minutes.  50% or more time was spent in counseling patient and family regarding prognosis and options of treatment and available resources  Patient expressed understanding and was in agreement with this plan. She also understands that She can call clinic at any time with any questions, concerns, or complaints.    Cancer of lower lobe of left lung ([place])   Staging form: Lung, AJCC 7th Edition     Clinical: T2, N3, M1 - Signed by Forest Gleason, MD on 12/05/2015   Forest Gleason, MD   03/31/2016 8:13 AM

## 2016-04-09 ENCOUNTER — Telehealth: Payer: Self-pay | Admitting: *Deleted

## 2016-04-09 ENCOUNTER — Other Ambulatory Visit: Payer: Self-pay | Admitting: Family Medicine

## 2016-04-09 MED ORDER — AZITHROMYCIN 250 MG PO TABS: ORAL_TABLET | ORAL | Status: DC

## 2016-04-09 MED ORDER — BENZONATATE 100 MG PO CAPS: 100.0000 mg | ORAL_CAPSULE | Freq: Three times a day (TID) | ORAL | Status: DC | PRN

## 2016-04-09 MED ORDER — PREDNISONE 20 MG PO TABS: 60.0000 mg | ORAL_TABLET | Freq: Every day | ORAL | Status: DC

## 2016-04-09 NOTE — Telephone Encounter (Signed)
Called asking for something for a bad cough, rattling cough, kept her awake all night "feels like it is stuck in her throat" Has tried claritin and robitussin. Brownish colored mucous

## 2016-04-09 NOTE — Telephone Encounter (Signed)
Pt informed of prescriptions being sent by L Herring, AGNP-C

## 2016-05-04 ENCOUNTER — Inpatient Hospital Stay: Payer: BLUE CROSS/BLUE SHIELD | Attending: Internal Medicine

## 2016-05-04 DIAGNOSIS — Z923 Personal history of irradiation: Secondary | ICD-10-CM | POA: Insufficient documentation

## 2016-05-04 DIAGNOSIS — R079 Chest pain, unspecified: Secondary | ICD-10-CM | POA: Insufficient documentation

## 2016-05-04 DIAGNOSIS — C3490 Malignant neoplasm of unspecified part of unspecified bronchus or lung: Secondary | ICD-10-CM

## 2016-05-04 DIAGNOSIS — C7931 Secondary malignant neoplasm of brain: Secondary | ICD-10-CM | POA: Diagnosis not present

## 2016-05-04 DIAGNOSIS — R05 Cough: Secondary | ICD-10-CM | POA: Diagnosis not present

## 2016-05-04 DIAGNOSIS — M549 Dorsalgia, unspecified: Secondary | ICD-10-CM | POA: Diagnosis not present

## 2016-05-04 DIAGNOSIS — Z452 Encounter for adjustment and management of vascular access device: Secondary | ICD-10-CM | POA: Insufficient documentation

## 2016-05-04 DIAGNOSIS — Z9221 Personal history of antineoplastic chemotherapy: Secondary | ICD-10-CM | POA: Diagnosis not present

## 2016-05-04 DIAGNOSIS — Z79899 Other long term (current) drug therapy: Secondary | ICD-10-CM | POA: Diagnosis not present

## 2016-05-04 DIAGNOSIS — Z87891 Personal history of nicotine dependence: Secondary | ICD-10-CM | POA: Insufficient documentation

## 2016-05-04 DIAGNOSIS — E119 Type 2 diabetes mellitus without complications: Secondary | ICD-10-CM | POA: Insufficient documentation

## 2016-05-04 DIAGNOSIS — C7970 Secondary malignant neoplasm of unspecified adrenal gland: Secondary | ICD-10-CM | POA: Insufficient documentation

## 2016-05-04 DIAGNOSIS — C3432 Malignant neoplasm of lower lobe, left bronchus or lung: Secondary | ICD-10-CM | POA: Diagnosis not present

## 2016-05-04 DIAGNOSIS — C7900 Secondary malignant neoplasm of unspecified kidney and renal pelvis: Secondary | ICD-10-CM | POA: Diagnosis not present

## 2016-05-04 MED ORDER — HEPARIN SOD (PORK) LOCK FLUSH 100 UNIT/ML IV SOLN
500.0000 [IU] | Freq: Once | INTRAVENOUS | Status: AC
  Administered 2016-05-04: 500 [IU] via INTRAVENOUS

## 2016-05-04 MED ORDER — SODIUM CHLORIDE 0.9% FLUSH
10.0000 mL | INTRAVENOUS | Status: DC | PRN
  Administered 2016-05-04: 10 mL via INTRAVENOUS
  Filled 2016-05-04: qty 10

## 2016-05-17 ENCOUNTER — Telehealth: Payer: Self-pay | Admitting: *Deleted

## 2016-05-17 ENCOUNTER — Other Ambulatory Visit: Payer: Self-pay | Admitting: Internal Medicine

## 2016-05-17 ENCOUNTER — Inpatient Hospital Stay (HOSPITAL_BASED_OUTPATIENT_CLINIC_OR_DEPARTMENT_OTHER): Payer: BLUE CROSS/BLUE SHIELD | Admitting: Oncology

## 2016-05-17 VITALS — BP 122/80 | HR 109 | Temp 99.7°F | Resp 18 | Wt 134.5 lb

## 2016-05-17 DIAGNOSIS — M549 Dorsalgia, unspecified: Secondary | ICD-10-CM

## 2016-05-17 DIAGNOSIS — C7900 Secondary malignant neoplasm of unspecified kidney and renal pelvis: Secondary | ICD-10-CM | POA: Diagnosis not present

## 2016-05-17 DIAGNOSIS — C7970 Secondary malignant neoplasm of unspecified adrenal gland: Secondary | ICD-10-CM

## 2016-05-17 DIAGNOSIS — Z452 Encounter for adjustment and management of vascular access device: Secondary | ICD-10-CM

## 2016-05-17 DIAGNOSIS — E119 Type 2 diabetes mellitus without complications: Secondary | ICD-10-CM

## 2016-05-17 DIAGNOSIS — C7931 Secondary malignant neoplasm of brain: Secondary | ICD-10-CM | POA: Diagnosis not present

## 2016-05-17 DIAGNOSIS — R05 Cough: Secondary | ICD-10-CM

## 2016-05-17 DIAGNOSIS — Z923 Personal history of irradiation: Secondary | ICD-10-CM

## 2016-05-17 DIAGNOSIS — R079 Chest pain, unspecified: Secondary | ICD-10-CM

## 2016-05-17 DIAGNOSIS — R059 Cough, unspecified: Secondary | ICD-10-CM

## 2016-05-17 DIAGNOSIS — Z79899 Other long term (current) drug therapy: Secondary | ICD-10-CM

## 2016-05-17 DIAGNOSIS — Z9221 Personal history of antineoplastic chemotherapy: Secondary | ICD-10-CM

## 2016-05-17 DIAGNOSIS — C3432 Malignant neoplasm of lower lobe, left bronchus or lung: Secondary | ICD-10-CM | POA: Diagnosis not present

## 2016-05-17 DIAGNOSIS — Z87891 Personal history of nicotine dependence: Secondary | ICD-10-CM

## 2016-05-17 MED ORDER — BENZONATATE 200 MG PO CAPS: 200.0000 mg | ORAL_CAPSULE | Freq: Three times a day (TID) | ORAL | Status: DC | PRN

## 2016-05-17 MED ORDER — AZITHROMYCIN 250 MG PO TABS: ORAL_TABLET | ORAL | Status: DC

## 2016-05-17 NOTE — Progress Notes (Signed)
[place]  [phone]  [phone]     Cynthia Carson DOB: 01/16/1951  MR#: [id number]  TIW#:[id number]  Patient Care Team: Marden Noble, MD as PCP - General (Internal Medicine)  CHIEF COMPLAINT:  Chief Complaint  Patient presents with  . Lung Cancer    coughing    INTERVAL HISTORY: Patient is here today as acute add on for cough that is causing her chest and back to hurt. She is not sleeping well due to the cough. She coughs up yellowish sputum. She doesn't feel good but she feels 'ok'. She states zpack has worked in the past. She finished chemotherapy in April 2017.    REVIEW OF SYSTEMS:   Review of Systems  Constitutional: Positive for malaise/fatigue.  HENT: Negative.   Eyes: Negative.   Respiratory: Positive for cough and sputum production.   Cardiovascular: Positive for chest pain.  Gastrointestinal: Negative.   Genitourinary: Negative.   Musculoskeletal: Positive for back pain.  Skin: Negative.   Neurological: Positive for weakness.  Psychiatric/Behavioral: Negative.     As per HPI. Otherwise, a complete review of systems is negatve.  ONCOLOGY HISTORY: Oncology History   1. 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 Diagnosis in January of 2017.   Diagnosis by the right supraclavicular lymph node biopsy.  T2 N3 M1 stage IV disease with metastases to the brain, kidney, and adrenal gland.  2. Patient has finished radiation therapy. (Feb of 2017)  3. Startingchemotherapy December 26, 2015 with carboplatinum and VP-16.     Cancer of lower lobe of left lung ([place])   12/05/2015 Initial Diagnosis Cancer of lower lobe of left lung [facility])    PAST MEDICAL HISTORY: Past Medical History  Diagnosis Date  . Diabetes mellitus without complication ([place])   . Cancer of lung ([place]) 12/05/2015    PAST SURGICAL HISTORY: Past Surgical  History  Procedure Laterality Date  . Abdominal hysterectomy    . Peripheral vascular catheterization N/A 01/13/2016    Procedure: Glori Luis Cath Insertion;  Surgeon: Katha Cabal, MD;  Location: [facility];  Service: Cardiovascular;  Laterality: N/A;      HEALTH MAINTENANCE: Social History  Substance Use Topics  . Smoking status: Former Smoker -- 1.00 packs/day for 49 years    Types: Cigarettes    Quit date: 11/19/2011  . Smokeless tobacco: Not on file  . Alcohol Use: No    Allergies  Allergen Reactions  . Amoxicillin Other (See Comments)    Reaction:  Unknown   . Levofloxacin Other (See Comments)    Reaction:  Joint and muscle pain  Pt states that this medication made her unable to walk.     Current Outpatient Prescriptions  Medication Sig Dispense Refill  . ALPRAZolam (XANAX) 0.5 MG tablet Take 0.5 mg by mouth 3 (three) times daily as needed for anxiety.     Marland Kitchen glimepiride (AMARYL) 2 MG tablet Take 2 mg by mouth daily with breakfast.     . KLOR-CON M20 20 MEQ tablet     . latanoprost (XALATAN) 0.005 % ophthalmic solution Place 1 drop into both eyes at bedtime.    . lidocaine-prilocaine (EMLA) cream Apply 1 application topically as needed. 30 g 3  . Omega-3 Fatty Acids (FISH OIL) 1000 MG CAPS Take 2,000 mg by mouth 2 (two) times daily.     Marland Kitchen omeprazole (PRILOSEC) 20 MG capsule Take 20 mg by mouth daily.     Marland Kitchen  sitaGLIPtin (JANUVIA) 100 MG tablet Take 100 mg by mouth daily.    . azithromycin (ZITHROMAX Z-PAK) 250 MG tablet Take as directed (Patient not taking: Reported on 05/17/2016) 6 each 0  . prochlorperazine (COMPAZINE) 10 MG tablet Take 1 tablet (10 mg total) by mouth every 6 (six) hours as needed for nausea or vomiting. (Patient not taking: Reported on 05/17/2016) 30 tablet 3   No current facility-administered medications for this visit.   Facility-Administered Medications Ordered in Other Visits  Medication Dose Route Frequency Provider Last Rate Last Dose    . alteplase (CATHFLO ACTIVASE) injection 2 mg  2 mg Intracatheter Once PRN Janak Choksi, MD      . heparin lock flush 100 unit/mL  500 Units Intravenous Once Janak Choksi, MD      . heparin lock flush 100 unit/mL  500 Units Intracatheter Once PRN Janak Choksi, MD      . heparin lock flush 100 unit/mL  250 Units Intracatheter Once PRN Janak Choksi, MD      . sodium chloride 0.9 % injection 10 mL  10 mL Intracatheter PRN Janak Choksi, MD      . sodium chloride flush (NS) 0.9 % injection 10 mL  10 mL Intravenous PRN Janak Choksi, MD      . sodium chloride flush (NS) 0.9 % injection 10 mL  10 mL Intravenous PRN Janak Choksi, MD   10 mL at 02/01/16 1439    OBJECTIVE: BP 122/80 mmHg  Pulse 109  Temp(Src) 99.7 F (37.6 C) (Tympanic)  Resp 18  Wt 134 lb 7.7 oz (61 kg)   Body mass index is 22.38 kg/(m^2).    ECOG FS:1 - Symptomatic but completely ambulatory  General: Well-developed, well-nourished, no acute distress.  Eyes: Pink conjunctiva, anicteric sclera. HEENT: Normocephalic, moist mucous membranes, clear oropharnyx. Lungs: bilateral wheezing. Heart: Regular rate and rhythm. No rubs, murmurs, or gallops. Abdomen: Soft, nontender, nondistended. Musculoskeletal: No edema, cyanosis, or clubbing. Neuro: Alert, answering all questions appropriately.  Skin: No rashes or petechiae noted. Psych: Normal affect.  LAB RESULTS:  No visits with results within 3 Day(s) from this visit. Latest known visit with results is:  Infusion on 03/21/2016  Component Date Value Ref Range Status  . WBC 03/21/2016 5.6  3.6 - 11.0 K/uL Final  . RBC 03/21/2016 2.27* 3.80 - 5.20 MIL/uL Final  . Hemoglobin 03/21/2016 7.6* 12.0 - 16.0 g/dL Final  . HCT 03/21/2016 22.5* 35.0 - 47.0 % Final  . MCV 03/21/2016 99.1  80.0 - 100.0 fL Final  . MCH 03/21/2016 33.3  26.0 - 34.0 pg Final  . MCHC 03/21/2016 33.6  32.0 - 36.0 g/dL Final  . RDW 03/21/2016 24.0* 11.5 - 14.5 % Final  . Platelets 03/21/2016 134* 150 - 440  K/uL Final  . Neutrophils Relative % 03/21/2016 53   Final  . Neutro Abs 03/21/2016 3.0  1.4 - 6.5 K/uL Final  . Lymphocytes Relative 03/21/2016 30   Final  . Lymphs Abs 03/21/2016 1.7  1.0 - 3.6 K/uL Final  . Monocytes Relative 03/21/2016 16   Final  . Monocytes Absolute 03/21/2016 0.9  0.2 - 0.9 K/uL Final  . Eosinophils Relative 03/21/2016 1   Final  . Eosinophils Absolute 03/21/2016 0.0  0 - 0.7 K/uL Final  . Basophils Relative 03/21/2016 0   Final  . Basophils Absolute 03/21/2016 0.0  0 - 0.1 K/uL Final  . Magnesium 03/21/2016 1.9  1.7 - 2.4 mg/dL Final  . Sodium 03/21/2016 135    135 - 145 mmol/L Final  . Potassium 03/21/2016 3.8  3.5 - 5.1 mmol/L Final  . Chloride 03/21/2016 102  101 - 111 mmol/L Final  . CO2 03/21/2016 24  22 - 32 mmol/L Final  . Glucose, Bld 03/21/2016 177* 65 - 99 mg/dL Final  . BUN 03/21/2016 10  6 - 20 mg/dL Final  . Creatinine, Ser 03/21/2016 0.90  0.44 - 1.00 mg/dL Final  . Calcium 03/21/2016 9.5  8.9 - 10.3 mg/dL Final  . Total Protein 03/21/2016 6.8  6.5 - 8.1 g/dL Final  . Albumin 03/21/2016 3.9  3.5 - 5.0 g/dL Final  . AST 03/21/2016 25  15 - 41 U/L Final  . ALT 03/21/2016 14  14 - 54 U/L Final  . Alkaline Phosphatase 03/21/2016 55  38 - 126 U/L Final  . Total Bilirubin 03/21/2016 0.4  0.3 - 1.2 mg/dL Final  . GFR calc non Af Amer 03/21/2016 >60  >60 mL/min Final  . GFR calc Af Amer 03/21/2016 >60  >60 mL/min Final   Comment: (NOTE) The eGFR has been calculated using the CKD EPI equation. This calculation has not been validated in all clinical situations. eGFR's persistently <60 mL/min signify possible Chronic Kidney Disease.   . Anion gap 03/21/2016 9  5 - 15 Final    STUDIES: No results found.  ASSESSMENT & PLAN:    1. Lung Cancer: CT scan scheduled for August 2017 and follow up with Dr B at that time.  2. Productive Cough: Zpack, Tessalon Pearls sent to pharmacy. Patient will also try OTC Mucinex. She will call if symptoms get worse or  don't improve.  Patient expressed understanding and was in agreement with this plan. She also understands that She can call clinic at any time with any questions, concerns, or complaints.   Dr. Finnegan was available for consultation and review of plan of care for this patient.  Cancer of lower lobe of left lung ([facility])   Staging form: Lung, AJCC 7th Edition     Clinical: T2, N3, M1 - Signed by Janak Choksi, MD on 12/05/2015   Tricia Amelung, NP   05/17/2016 3:32 PM     

## 2016-05-17 NOTE — Telephone Encounter (Signed)
Patient states she has been coughing a lot.  She is sore in her chest and back.  Would like to be seen.  Please advise.

## 2016-05-17 NOTE — Telephone Encounter (Signed)
Rn spoke with Dr. Rogue Bussing patient's symptoms. MD would like Cynthia Sender, NP to see patient this afternoon for assessment.  Pt notified. She can be at cancer center today b/w 230pm and 245pm. RN provided Hand off provided to Cynthia Sender, NP

## 2016-05-18 ENCOUNTER — Other Ambulatory Visit: Payer: Self-pay | Admitting: Oncology

## 2016-05-18 ENCOUNTER — Telehealth: Payer: Self-pay | Admitting: Oncology

## 2016-05-18 DIAGNOSIS — R059 Cough, unspecified: Secondary | ICD-10-CM

## 2016-05-18 DIAGNOSIS — R05 Cough: Secondary | ICD-10-CM

## 2016-05-18 DIAGNOSIS — C3432 Malignant neoplasm of lower lobe, left bronchus or lung: Secondary | ICD-10-CM

## 2016-05-18 NOTE — Telephone Encounter (Signed)
I called the patient today to see if she was feeling better after her visit yesterday for cough. She states that she slept so much better last night without coughing. She reports that her chest and back are not hurting today and she has not been coughing much this morning. She feels much better today. She is not having any trouble breathing. I offered her a chest x-ray but she states that she thinks the zpack, tessalon pearles and the mucinex are helping and will pass on the xray at this time. She will call back if symptoms worsen.

## 2016-05-23 ENCOUNTER — Other Ambulatory Visit: Payer: Self-pay | Admitting: *Deleted

## 2016-05-23 ENCOUNTER — Telehealth: Payer: Self-pay | Admitting: *Deleted

## 2016-05-23 ENCOUNTER — Ambulatory Visit
Admission: RE | Admit: 2016-05-23 | Discharge: 2016-05-23 | Disposition: A | Discharge status: Home / Self Care | Payer: BLUE CROSS/BLUE SHIELD | Source: Ambulatory Visit | Attending: Oncology | Admitting: Oncology

## 2016-05-23 DIAGNOSIS — C3432 Malignant neoplasm of lower lobe, left bronchus or lung: Secondary | ICD-10-CM | POA: Diagnosis not present

## 2016-05-23 DIAGNOSIS — J9811 Atelectasis: Secondary | ICD-10-CM

## 2016-05-23 DIAGNOSIS — R05 Cough: Secondary | ICD-10-CM | POA: Diagnosis present

## 2016-05-23 DIAGNOSIS — R059 Cough, unspecified: Secondary | ICD-10-CM

## 2016-05-23 MED ORDER — DOXYCYCLINE HYCLATE 100 MG PO CAPS: 100.0000 mg | ORAL_CAPSULE | Freq: Two times a day (BID) | ORAL | Status: DC

## 2016-05-23 NOTE — Telephone Encounter (Signed)
Incoming call from patient. Reports s/s of cough, pain in throat, denies any fever, shortness of breath and pain when coughing. Pt was given oral abx at last apt with NP. No improvement in symptoms.  RN spoke with Dr. Rogue Bussing, md would like patient to obtain a chest xray asap today. We will contact her with these results.  Pt previously did not go obtain cxr per NP's advice.  I contacted the patient back. She understands to obtain cxr today. She states that she will go to [address] location. As soon as results are available, then I will contact her with md recommendations.

## 2016-05-23 NOTE — Telephone Encounter (Signed)
Discussed cxr results with patient. Per Dr. Rogue Bussing, cxr demonstrates a possible infectious process in lung/atelectis. This was explained to patient. She was instructed that md has sent in an rx for doxycyline 100 mg twice daily x 10 day. I instructed her to drink lots of po fluid intake/water with this antibiotics. I asked her to avoid being in direct sunlight while taking this medication as this may make her more sensitive to sunburns. I explained that this drug can cause gi distress. I asked her to report severe cases of diarrhea. She can take this medication with saltine crackers if nausea occurs/persists while taking the antibiotic. Teach back process performed patient.

## 2016-05-28 ENCOUNTER — Other Ambulatory Visit: Payer: Self-pay | Admitting: Internal Medicine

## 2016-05-28 ENCOUNTER — Telehealth: Payer: Self-pay | Admitting: Internal Medicine

## 2016-05-28 DIAGNOSIS — C3432 Malignant neoplasm of lower lobe, left bronchus or lung: Secondary | ICD-10-CM

## 2016-05-28 NOTE — Telephone Encounter (Signed)
Pt needs to have chest CT with constast ASAP; i have pu the orders in/ please inform/ schedule- Thx

## 2016-05-29 ENCOUNTER — Other Ambulatory Visit: Payer: Self-pay | Admitting: *Deleted

## 2016-05-29 ENCOUNTER — Telehealth: Payer: Self-pay | Admitting: *Deleted

## 2016-05-29 DIAGNOSIS — C3432 Malignant neoplasm of lower lobe, left bronchus or lung: Secondary | ICD-10-CM

## 2016-05-29 NOTE — Telephone Encounter (Signed)
Attempted to reach patient. Unable to leave vm. vm not set up.

## 2016-05-29 NOTE — Telephone Encounter (Signed)
Attempted to reach patient to set up her ct scan. Not able to leave vm. vm box not set up.

## 2016-05-29 NOTE — Telephone Encounter (Signed)
Cynthia Carson in cancer center scheduling was able to contact patient.  The soonest available apt for CT scan at [facility] is on July 20'th.  Dr. Rogue Bussing wanted this scan as soon as possible.  We asked the patient if she would be willing to drive to [facility] to obtain this imaging there.  Pt refused and states that she doesn't want to go to [facility] for her ct scan. Pt also has an upcoming ct scan of abdomen schedule on August 11.  RN spoke with Dr. Sherlene Shams md aware of patient's decision not to go to [facility]. V/o obtained from MD to move up ct of abd to same apt time as ct of chest.  Lattie Haw contacted Dr. Rogue Bussing in [facility] at 1010 am.  Lattie Haw advocated with radiology dept to bump up ct scan to early date. She was able to get the scan tomorrow. She will inform the patient.

## 2016-05-30 ENCOUNTER — Telehealth: Payer: Self-pay | Admitting: *Deleted

## 2016-05-30 ENCOUNTER — Ambulatory Visit: Payer: BLUE CROSS/BLUE SHIELD

## 2016-05-30 ENCOUNTER — Ambulatory Visit: Admission: RE | Admit: 2016-05-30 | Payer: BLUE CROSS/BLUE SHIELD | Source: Ambulatory Visit

## 2016-05-30 ENCOUNTER — Ambulatory Visit
Admission: RE | Admit: 2016-05-30 | Discharge: 2016-05-30 | Disposition: A | Discharge status: Home / Self Care | Payer: BLUE CROSS/BLUE SHIELD | Source: Ambulatory Visit | Attending: Internal Medicine | Admitting: Internal Medicine

## 2016-05-30 DIAGNOSIS — C3432 Malignant neoplasm of lower lobe, left bronchus or lung: Secondary | ICD-10-CM | POA: Insufficient documentation

## 2016-05-30 DIAGNOSIS — N289 Disorder of kidney and ureter, unspecified: Secondary | ICD-10-CM | POA: Insufficient documentation

## 2016-05-30 DIAGNOSIS — R590 Localized enlarged lymph nodes: Secondary | ICD-10-CM | POA: Diagnosis not present

## 2016-05-30 DIAGNOSIS — J9819 Other pulmonary collapse: Secondary | ICD-10-CM | POA: Diagnosis not present

## 2016-05-30 DIAGNOSIS — E279 Disorder of adrenal gland, unspecified: Secondary | ICD-10-CM | POA: Insufficient documentation

## 2016-05-30 DIAGNOSIS — K8689 Other specified diseases of pancreas: Secondary | ICD-10-CM | POA: Insufficient documentation

## 2016-05-30 LAB — POCT I-STAT CREATININE: Creatinine, Ser: 0.8 mg/dL (ref 0.44–1.00)

## 2016-05-30 MED ORDER — IOPAMIDOL (ISOVUE-300) INJECTION 61%
85.0000 mL | Freq: Once | INTRAVENOUS | Status: AC | PRN
  Administered 2016-05-30: 85 mL via INTRAVENOUS

## 2016-05-30 NOTE — Telephone Encounter (Signed)
Called left msg asking patient to come to cancer ctr asap to discuss results. Apt at 345pm.   No answer.  I asked scheduling to continue to contact patient.

## 2016-05-30 NOTE — Telephone Encounter (Signed)
Pt returned call - spoke to [place] and agreed to come tomorrow. Unable to come today for an apt.

## 2016-05-31 ENCOUNTER — Ambulatory Visit: Payer: BLUE CROSS/BLUE SHIELD

## 2016-05-31 ENCOUNTER — Ambulatory Visit: Payer: BLUE CROSS/BLUE SHIELD | Admitting: Internal Medicine

## 2016-05-31 ENCOUNTER — Inpatient Hospital Stay: Payer: BLUE CROSS/BLUE SHIELD | Attending: Internal Medicine | Admitting: Internal Medicine

## 2016-05-31 VITALS — BP 93/61 | HR 93 | Temp 98.0°F | Resp 18 | Wt 129.2 lb

## 2016-05-31 DIAGNOSIS — Z9221 Personal history of antineoplastic chemotherapy: Secondary | ICD-10-CM | POA: Diagnosis not present

## 2016-05-31 DIAGNOSIS — R590 Localized enlarged lymph nodes: Secondary | ICD-10-CM | POA: Insufficient documentation

## 2016-05-31 DIAGNOSIS — C7970 Secondary malignant neoplasm of unspecified adrenal gland: Secondary | ICD-10-CM | POA: Diagnosis not present

## 2016-05-31 DIAGNOSIS — R0602 Shortness of breath: Secondary | ICD-10-CM | POA: Diagnosis not present

## 2016-05-31 DIAGNOSIS — D61810 Antineoplastic chemotherapy induced pancytopenia: Secondary | ICD-10-CM | POA: Diagnosis not present

## 2016-05-31 DIAGNOSIS — Z923 Personal history of irradiation: Secondary | ICD-10-CM | POA: Insufficient documentation

## 2016-05-31 DIAGNOSIS — Z79899 Other long term (current) drug therapy: Secondary | ICD-10-CM | POA: Diagnosis not present

## 2016-05-31 DIAGNOSIS — Z87891 Personal history of nicotine dependence: Secondary | ICD-10-CM | POA: Diagnosis not present

## 2016-05-31 DIAGNOSIS — C7900 Secondary malignant neoplasm of unspecified kidney and renal pelvis: Secondary | ICD-10-CM | POA: Insufficient documentation

## 2016-05-31 DIAGNOSIS — C3432 Malignant neoplasm of lower lobe, left bronchus or lung: Secondary | ICD-10-CM | POA: Diagnosis not present

## 2016-05-31 DIAGNOSIS — R05 Cough: Secondary | ICD-10-CM | POA: Diagnosis not present

## 2016-05-31 DIAGNOSIS — E119 Type 2 diabetes mellitus without complications: Secondary | ICD-10-CM | POA: Diagnosis not present

## 2016-05-31 DIAGNOSIS — R221 Localized swelling, mass and lump, neck: Secondary | ICD-10-CM | POA: Insufficient documentation

## 2016-05-31 DIAGNOSIS — C7931 Secondary malignant neoplasm of brain: Secondary | ICD-10-CM | POA: Insufficient documentation

## 2016-05-31 DIAGNOSIS — R630 Anorexia: Secondary | ICD-10-CM | POA: Insufficient documentation

## 2016-05-31 LAB — COMPREHENSIVE METABOLIC PANEL
ALK PHOS: 63 U/L (ref 38–126)
ALT: 11 U/L — AB (ref 14–54)
AST: 16 U/L (ref 15–41)
Albumin: 3.6 g/dL (ref 3.5–5.0)
Anion gap: 8 (ref 5–15)
BILIRUBIN TOTAL: 0.3 mg/dL (ref 0.3–1.2)
BUN: 15 mg/dL (ref 6–20)
CO2: 25 mmol/L (ref 22–32)
CREATININE: 0.75 mg/dL (ref 0.44–1.00)
Calcium: 9.5 mg/dL (ref 8.9–10.3)
Chloride: 101 mmol/L (ref 101–111)
GFR calc Af Amer: >60 mL/min (ref >60)
Glucose, Bld: 126 mg/dL — ABNORMAL HIGH (ref 65–99)
Potassium: 3.6 mmol/L (ref 3.5–5.1)
Sodium: 134 mmol/L — ABNORMAL LOW (ref 135–145)
TOTAL PROTEIN: 7.9 g/dL (ref 6.5–8.1)

## 2016-05-31 LAB — CBC WITH DIFFERENTIAL/PLATELET
Basophils Absolute: 0.1 10*3/uL (ref 0–0.1)
Basophils Relative: 1 %
EOS ABS: 0.1 10*3/uL (ref 0–0.7)
EOS PCT: 1 %
HCT: 33 % — ABNORMAL LOW (ref 35.0–47.0)
Hemoglobin: 11.3 g/dL — ABNORMAL LOW (ref 12.0–16.0)
LYMPHS ABS: 1.6 10*3/uL (ref 1.0–3.6)
Lymphocytes Relative: 21 %
MCH: 33 pg (ref 26.0–34.0)
MCHC: 34.2 g/dL (ref 32.0–36.0)
MCV: 96.6 fL (ref 80.0–100.0)
Monocytes Absolute: 0.8 10*3/uL (ref 0.2–0.9)
Monocytes Relative: 10 %
Neutro Abs: 5.2 10*3/uL (ref 1.4–6.5)
Neutrophils Relative %: 67 %
PLATELETS: 289 10*3/uL (ref 150–440)
RBC: 3.41 MIL/uL — AB (ref 3.80–5.20)
RDW: 13.2 % (ref 11.5–14.5)
WBC: 7.6 10*3/uL (ref 3.6–11.0)

## 2016-05-31 LAB — LACTATE DEHYDROGENASE: LDH: 327 U/L — AB (ref 98–192)

## 2016-05-31 LAB — SAMPLE TO BLOOD BANK

## 2016-05-31 NOTE — Progress Notes (Signed)
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 Diagnosis in January of 2017. Dx-  right supraclavicular lymph node biopsy.  # T2 N3 M1 stage IV disease with metastases to the brain, kidney, and adrenal gland.  # S/p WBRT radiation therapy. (Feb of 2017;]  Startingchemotherapy December 26, 2015 with carboplatinum and VP-16.  # July 12th 2017- Recurrence/ progression- on CT- [facility]     Cancer of lower lobe of left lung ([place])   12/05/2015 Initial Diagnosis Cancer of lower lobe of left lung ([place])    INTERVAL HISTORY:  This is my first interaction with the patient as patient's primary oncologist has been Dr.Choksi. I reviewed the patient's prior charts/pertinent labs/imaging in detail; findings are summarized above.    Cynthia Carson 65 y.o.  female pleasant patient above history of Extensive stage small cell lung cancer whether to have worsening cough for the last few weeks. She complains of shortness of breath with exertion. Poor appetite. No nausea no vomiting. Denies any pain.  She has intermittent headaches. Not any worse.   REVIEW OF SYSTEMS:  A complete 10 point review of system is done which is negative except mentioned above/history of present illness.   PAST MEDICAL HISTORY :  Past Medical History  Diagnosis Date  . Cancer of lung ([place]) 12/05/2015  . Diabetes mellitus without complication ([place])     PAST SURGICAL HISTORY :   Past Surgical History  Procedure Laterality Date  . Abdominal hysterectomy    . Peripheral vascular catheterization N/A 01/13/2016    Procedure:  Glori Luis Cath Insertion;  Surgeon: Katha Cabal, MD;  Location: [facility];  Service: Cardiovascular;  Laterality: N/A;    FAMILY HISTORY :  No family history on file.  SOCIAL HISTORY:   Social History  Substance Use Topics  . Smoking status: Former Smoker -- 1.00 packs/day for 49 years    Types: Cigarettes    Quit date: 11/19/2011  . Smokeless tobacco: Not on file  . Alcohol Use: No    ALLERGIES:  is allergic to amoxicillin and levofloxacin.  MEDICATIONS:  Current Outpatient Prescriptions  Medication Sig Dispense Refill  . ALPRAZolam (XANAX) 0.5 MG tablet Take 0.5 mg by mouth 3 (three) times daily as needed for anxiety.     . benzonatate (TESSALON) 200 MG capsule Take 1 capsule (200 mg total) by mouth 3 (three) times daily as needed for cough. 20 capsule 0  . doxycycline (VIBRAMYCIN) 100 MG capsule Take 1 capsule (100 mg total) by mouth 2 (two) times daily. X 10 days 20 capsule 0  . glimepiride (AMARYL) 2 MG tablet Take 2 mg by mouth daily with breakfast.     . KLOR-CON M20 20 MEQ tablet     . latanoprost (XALATAN) 0.005 % ophthalmic solution Place 1 drop into both eyes at bedtime.    . lidocaine-prilocaine (EMLA) cream Apply 1 application topically as needed. 30 g 3  . Omega-3 Fatty Acids (FISH OIL) 1000 MG CAPS Take 2,000 mg by mouth 2 (two) times daily.     Marland Kitchen omeprazole (PRILOSEC)  20 MG capsule Take 20 mg by mouth daily.     . prochlorperazine (COMPAZINE) 10 MG tablet Take 1 tablet (10 mg total) by mouth every 6 (six) hours as needed for nausea or vomiting. 30 tablet 3  . sitaGLIPtin (JANUVIA) 100 MG tablet Take 100 mg by mouth daily.     No current facility-administered medications for this visit.   Facility-Administered Medications Ordered in Other Visits  Medication Dose Route Frequency Provider Last Rate Last Dose  . alteplase (CATHFLO ACTIVASE) injection 2 mg  2 mg Intracatheter Once PRN Forest Gleason, MD      . heparin lock flush 100 unit/mL  500 Units  Intravenous Once Forest Gleason, MD      . heparin lock flush 100 unit/mL  500 Units Intracatheter Once PRN Forest Gleason, MD      . heparin lock flush 100 unit/mL  250 Units Intracatheter Once PRN Forest Gleason, MD      . sodium chloride 0.9 % injection 10 mL  10 mL Intracatheter PRN Forest Gleason, MD      . sodium chloride flush (NS) 0.9 % injection 10 mL  10 mL Intravenous PRN Forest Gleason, MD      . sodium chloride flush (NS) 0.9 % injection 10 mL  10 mL Intravenous PRN Forest Gleason, MD   10 mL at 02/01/16 1439    PHYSICAL EXAMINATION: ECOG PERFORMANCE STATUS: 1 - Symptomatic but completely ambulatory  BP 93/61 mmHg  Pulse 93  Temp(Src) 98 F (36.7 C) (Tympanic)  Resp 18  Wt 129 lb 3 oz (58.6 kg)  Filed Weights   05/31/16 1022  Weight: 129 lb 3 oz (58.6 kg)    GENERAL: Thin built well-developed; Alert, no distress and comfortable.  Accompanied by her husband. EYES: no pallor or icterus OROPHARYNX: no thrush or ulceration; good dentition  NECK: supple, no masses felt LYMPH:  no palpable lymphadenopathy in the cervical, axillary or inguinal regions LUNGS: Decreased results of the left lower base No wheeze or crackles HEART/CVS: regular rate & rhythm and no murmurs; No lower extremity edema ABDOMEN:abdomen soft, non-tender and normal bowel sounds Musculoskeletal:no cyanosis of digits and no clubbing  PSYCH: alert & oriented x 3 with fluent speech NEURO: no focal motor/sensory deficits SKIN:  no rashes or significant lesions  LABORATORY DATA:  I have reviewed the data as listed    Component Value Date/Time   NA 134* 05/31/2016 1119   K 3.6 05/31/2016 1119   CL 101 05/31/2016 1119   CO2 25 05/31/2016 1119   GLUCOSE 126* 05/31/2016 1119   BUN 15 05/31/2016 1119   CREATININE 0.75 05/31/2016 1119   CALCIUM 9.5 05/31/2016 1119   PROT 7.9 05/31/2016 1119   ALBUMIN 3.6 05/31/2016 1119   AST 16 05/31/2016 1119   ALT 11* 05/31/2016 1119   ALKPHOS 63 05/31/2016 1119   BILITOT  0.3 05/31/2016 1119   GFRNONAA >60 05/31/2016 1119   GFRAA >60 05/31/2016 1119    No results found for: SPEP, UPEP  Lab Results  Component Value Date   WBC 7.6 05/31/2016   NEUTROABS 5.2 05/31/2016   HGB 11.3* 05/31/2016   HCT 33.0* 05/31/2016   MCV 96.6 05/31/2016   PLT 289 05/31/2016      Chemistry      Component Value Date/Time   NA 134* 05/31/2016 1119   K 3.6 05/31/2016 1119   CL 101 05/31/2016 1119   CO2 25 05/31/2016 1119   BUN 15 05/31/2016 1119  CREATININE 0.75 05/31/2016 1119      Component Value Date/Time   CALCIUM 9.5 05/31/2016 1119   ALKPHOS 63 05/31/2016 1119   AST 16 05/31/2016 1119   ALT 11* 05/31/2016 1119   BILITOT 0.3 05/31/2016 1119       RADIOGRAPHIC STUDIES: I have personally reviewed the radiological images as listed and agreed with the findings in the report. Ct Chest W Contrast  05/30/2016  CLINICAL DATA:  Small-cell undifferentiated left lower lobe carcinoma with mediastinal lymphadenopathy in metastatic disease to brain, kidney, and adrenal gland. EXAM: CT CHEST, ABDOMEN, AND PELVIS WITH CONTRAST TECHNIQUE: Multidetector CT imaging of the chest, abdomen and pelvis was performed following the standard protocol during bolus administration of intravenous contrast. CONTRAST:  45m ISOVUE-300 IOPAMIDOL (ISOVUE-300) INJECTION 61% COMPARISON:  03/13/2016 FINDINGS: CT CHEST FINDINGS Mediastinum/Lymph Nodes: There is no axillary lymphadenopathy. 2.1 cm necrotic prevascular lymph node is new in the interval. 17 mm prevascular lymph node is new. 15 mm short axis subcarinal lymph node measured previously is now 25 mm. 21 x 23 mm necrotic lesion in the inferior left hilum is new in the interval. Heart size is normal. Trace pericardial effusion slightly progressed in the interval. Right Port-A-Cath tip is positioned at the SVC/RA junction. Lungs/Pleura: Interval development of left lower lobe collapse, potentially secondary to the new necrotic lesion in the  inferior left hilum. Right lung is clear. No pleural effusion on either side. Musculoskeletal: Bone windows reveal no worrisome lytic or sclerotic osseous lesions. CT ABDOMEN PELVIS FINDINGS Hepatobiliary: No focal abnormality within the liver parenchyma. There is no evidence for gallstones, gallbladder wall thickening, or pericholecystic fluid. No intrahepatic or extrahepatic biliary dilation. Pancreas: Interval development of a new 15 mm lesion in the head of the pancreas, in close proximity to the distal common bile duct. A new 11 mm hypervascular lesion is identified in the uncinate process of the pancreas. A new 16 mm lesion is identified in the body the pancreas. Spleen: Unremarkable. Adrenals/Urinary Tract: Right adrenal gland is unremarkable. 9 mm left adrenal nodule measures 11 mm currently. There is a new ill-defined 7 mm hypo attenuating lesion in the interpolar right kidney. Hypo enhancement in the upper pole of the left kidney is stable. Stomach/Bowel: Stomach is nondistended. No gastric wall thickening. No evidence of outlet obstruction. Duodenum is normally positioned as is the ligament of Treitz. Visualized portions of the small bowel and colon are unremarkable. Vascular/Lymphatic: There is abdominal aortic atherosclerosis without aneurysm. There is no gastrohepatic or hepatoduodenal ligament lymphadenopathy. No intraperitoneal or retroperitoneal lymphadenopathy. Other: No intraperitoneal free fluid. Musculoskeletal: Bone windows reveal no worrisome lytic or sclerotic osseous lesions. IMPRESSION: 1. Overall interval progression of disease, approaching the appearance of on the scan from 11/24/2015. While some of the mediastinal lymphadenopathy seen on that study from 6 months ago remain slightly smaller today, the prevascular lymph nodes on today's exam are bigger than they were back in January. 2. Marked interval progression of necrotic mediastinal and hilar lymphadenopathy since 03/13/2016. 3.  Interval development since 03/13/2016 of complete left lower lobe collapse, likely secondary to a new 2.3 cm necrotic lesion in the inferior left hilum. Imaging features now similar to the 11/24/2015 exam. 4. Interval development of multiple hypo enhancing lesions in the pancreatic parenchyma suggesting new metastatic disease. These are new since 11/24/2015, also. 5. Minimal increase in left adrenal nodule. 6. New 7 mm hypo enhancing lesion in the interpolar right kidney. Electronically Signed   By: EVerda CuminsD.  On: 05/30/2016 13:41   Ct Abdomen W Contrast  05/30/2016  CLINICAL DATA:  Small-cell undifferentiated left lower lobe carcinoma with mediastinal lymphadenopathy in metastatic disease to brain, kidney, and adrenal gland. EXAM: CT CHEST, ABDOMEN, AND PELVIS WITH CONTRAST TECHNIQUE: Multidetector CT imaging of the chest, abdomen and pelvis was performed following the standard protocol during bolus administration of intravenous contrast. CONTRAST:  63m ISOVUE-300 IOPAMIDOL (ISOVUE-300) INJECTION 61% COMPARISON:  03/13/2016 FINDINGS: CT CHEST FINDINGS Mediastinum/Lymph Nodes: There is no axillary lymphadenopathy. 2.1 cm necrotic prevascular lymph node is new in the interval. 17 mm prevascular lymph node is new. 15 mm short axis subcarinal lymph node measured previously is now 25 mm. 21 x 23 mm necrotic lesion in the inferior left hilum is new in the interval. Heart size is normal. Trace pericardial effusion slightly progressed in the interval. Right Port-A-Cath tip is positioned at the SVC/RA junction. Lungs/Pleura: Interval development of left lower lobe collapse, potentially secondary to the new necrotic lesion in the inferior left hilum. Right lung is clear. No pleural effusion on either side. Musculoskeletal: Bone windows reveal no worrisome lytic or sclerotic osseous lesions. CT ABDOMEN PELVIS FINDINGS Hepatobiliary: No focal abnormality within the liver parenchyma. There is no evidence for  gallstones, gallbladder wall thickening, or pericholecystic fluid. No intrahepatic or extrahepatic biliary dilation. Pancreas: Interval development of a new 15 mm lesion in the head of the pancreas, in close proximity to the distal common bile duct. A new 11 mm hypervascular lesion is identified in the uncinate process of the pancreas. A new 16 mm lesion is identified in the body the pancreas. Spleen: Unremarkable. Adrenals/Urinary Tract: Right adrenal gland is unremarkable. 9 mm left adrenal nodule measures 11 mm currently. There is a new ill-defined 7 mm hypo attenuating lesion in the interpolar right kidney. Hypo enhancement in the upper pole of the left kidney is stable. Stomach/Bowel: Stomach is nondistended. No gastric wall thickening. No evidence of outlet obstruction. Duodenum is normally positioned as is the ligament of Treitz. Visualized portions of the small bowel and colon are unremarkable. Vascular/Lymphatic: There is abdominal aortic atherosclerosis without aneurysm. There is no gastrohepatic or hepatoduodenal ligament lymphadenopathy. No intraperitoneal or retroperitoneal lymphadenopathy. Other: No intraperitoneal free fluid. Musculoskeletal: Bone windows reveal no worrisome lytic or sclerotic osseous lesions. IMPRESSION: 1. Overall interval progression of disease, approaching the appearance of on the scan from 11/24/2015. While some of the mediastinal lymphadenopathy seen on that study from 6 months ago remain slightly smaller today, the prevascular lymph nodes on today's exam are bigger than they were back in January. 2. Marked interval progression of necrotic mediastinal and hilar lymphadenopathy since 03/13/2016. 3. Interval development since 03/13/2016 of complete left lower lobe collapse, likely secondary to a new 2.3 cm necrotic lesion in the inferior left hilum. Imaging features now similar to the 11/24/2015 exam. 4. Interval development of multiple hypo enhancing lesions in the pancreatic  parenchyma suggesting new metastatic disease. These are new since 11/24/2015, also. 5. Minimal increase in left adrenal nodule. 6. New 7 mm hypo enhancing lesion in the interpolar right kidney. Electronically Signed   By: EMisty StanleyM.D.   On: 05/30/2016 13:41     ASSESSMENT & PLAN:  Cancer of lower lobe of left lung ([facility]) Recurrent small cell lung cancer- relapsed approximately 3 months post platinum treatment. July 12 CT scan shows progressive disease  # Recommend palliative chemotherapy with carboplatin-etoposide; starting next week.  # Growth factor-Neulasta/On pro would be given as prophylaxis for chemotherapy-induced neutropenia to  prevent febrile neutropenias.   #  I reviewed the images myself and with the patient and family in detail. A copy of this report was given.  # Understand the overall poor prognosis; and all treatments are palliative.  # History of brain metastases- check brain MRI  # Follow-up with me in approximately 10-14 days with labs.    I reviewed the images myself and with the patient and family in detail. A copy of this report was given.  Orders Placed This Encounter  Procedures  . MR Brain W Wo Contrast    Standing Status: Future     Number of Occurrences:      Standing Expiration Date: 05/31/2017    Order Specific Question:  If indicated for the ordered procedure, I authorize the administration of contrast media per Radiology protocol    Answer:  Yes    Order Specific Question:  Reason for Exam (SYMPTOM  OR DIAGNOSIS REQUIRED)    Answer:  small cell lung cancer; brain mets    Order Specific Question:  Preferred imaging location?    Answer:  [facility] (table limit-300lbs)    Order Specific Question:  What is the patient's sedation requirement?    Answer:  No Sedation    Order Specific Question:  Does the patient have a pacemaker or implanted devices?    Answer:  No  . Comprehensive metabolic panel    Standing Status: Future     Number of  Occurrences: 1     Standing Expiration Date: 07/05/2017  . CBC with Differential/Platelet    Standing Status: Future     Number of Occurrences: 1     Standing Expiration Date: 07/05/2017  . Lactate dehydrogenase    Standing Status: Future     Number of Occurrences: 1     Standing Expiration Date: 07/05/2017  . Hold Tube- Blood Bank    Standing Status: Future     Number of Occurrences: 1     Standing Expiration Date: 05/31/2017   All questions were answered. The patient knows to call the clinic with any problems, questions or concerns.      Cammie Sickle, MD 05/31/2016 9:06 PM

## 2016-05-31 NOTE — Assessment & Plan Note (Addendum)
Recurrent small cell lung cancer- relapsed approximately 3 months post platinum treatment. July 12 CT scan shows progressive disease  # Recommend palliative chemotherapy with carboplatin-etoposide; starting next week.  # Growth factor-Neulasta/On pro would be given as prophylaxis for chemotherapy-induced neutropenia to prevent febrile neutropenias.   #  I reviewed the images myself and with the patient and family in detail. A copy of this report was given.  # Understand the overall poor prognosis; and all treatments are palliative.  # History of brain metastases- check brain MRI  # Follow-up with me in approximately 10-14 days with labs.

## 2016-05-31 NOTE — Progress Notes (Signed)
Patient here today for CT results.  States after drinking contrast yesterday she had massive diarrhea.  Finally took Pepto to calm things down.  Also states when she coughs her chest hurts.  Otherwise no pain.  BP 93/61.  HR 93.  Sleeps a lot.

## 2016-06-01 ENCOUNTER — Telehealth: Payer: Self-pay | Admitting: *Deleted

## 2016-06-01 ENCOUNTER — Inpatient Hospital Stay: Payer: BLUE CROSS/BLUE SHIELD | Admitting: Internal Medicine

## 2016-06-01 ENCOUNTER — Ambulatory Visit
Admission: RE | Admit: 2016-06-01 | Discharge: 2016-06-01 | Disposition: A | Discharge status: Home / Self Care | Payer: BLUE CROSS/BLUE SHIELD | Source: Ambulatory Visit | Attending: Internal Medicine | Admitting: Internal Medicine

## 2016-06-01 DIAGNOSIS — C7931 Secondary malignant neoplasm of brain: Secondary | ICD-10-CM

## 2016-06-01 DIAGNOSIS — C3432 Malignant neoplasm of lower lobe, left bronchus or lung: Secondary | ICD-10-CM | POA: Diagnosis present

## 2016-06-01 MED ORDER — GADOBENATE DIMEGLUMINE 529 MG/ML IV SOLN
10.0000 mL | Freq: Once | INTRAVENOUS | Status: AC | PRN
  Administered 2016-06-01: 10 mL via INTRAVENOUS

## 2016-06-01 NOTE — Telephone Encounter (Signed)
Attempted to call patient.  No answer and no voice mailbox set up. 

## 2016-06-01 NOTE — Telephone Encounter (Signed)
-----   Message from Cammie Sickle, MD sent at 06/01/2016  1:36 PM EDT ----- Please inform pt that her MRI- shows stable/ no new disease in brain. Thx

## 2016-06-03 ENCOUNTER — Other Ambulatory Visit: Payer: Self-pay | Admitting: Oncology

## 2016-06-04 ENCOUNTER — Ambulatory Visit
Admission: RE | Admit: 2016-06-04 | Discharge: 2016-06-04 | Disposition: A | Discharge status: Home / Self Care | Payer: BLUE CROSS/BLUE SHIELD | Source: Ambulatory Visit | Attending: Oncology | Admitting: Oncology

## 2016-06-04 ENCOUNTER — Telehealth: Payer: Self-pay | Admitting: *Deleted

## 2016-06-04 ENCOUNTER — Other Ambulatory Visit: Payer: Self-pay | Admitting: Internal Medicine

## 2016-06-04 ENCOUNTER — Inpatient Hospital Stay (HOSPITAL_BASED_OUTPATIENT_CLINIC_OR_DEPARTMENT_OTHER): Payer: BLUE CROSS/BLUE SHIELD | Admitting: Oncology

## 2016-06-04 VITALS — BP 133/71 | HR 57 | Temp 97.7°F | Resp 18 | Wt 128.7 lb

## 2016-06-04 DIAGNOSIS — R590 Localized enlarged lymph nodes: Secondary | ICD-10-CM

## 2016-06-04 DIAGNOSIS — C779 Secondary and unspecified malignant neoplasm of lymph node, unspecified: Secondary | ICD-10-CM | POA: Diagnosis not present

## 2016-06-04 DIAGNOSIS — E119 Type 2 diabetes mellitus without complications: Secondary | ICD-10-CM

## 2016-06-04 DIAGNOSIS — C7900 Secondary malignant neoplasm of unspecified kidney and renal pelvis: Secondary | ICD-10-CM | POA: Diagnosis not present

## 2016-06-04 DIAGNOSIS — Z87891 Personal history of nicotine dependence: Secondary | ICD-10-CM

## 2016-06-04 DIAGNOSIS — Z79899 Other long term (current) drug therapy: Secondary | ICD-10-CM

## 2016-06-04 DIAGNOSIS — R221 Localized swelling, mass and lump, neck: Secondary | ICD-10-CM

## 2016-06-04 DIAGNOSIS — R05 Cough: Secondary | ICD-10-CM

## 2016-06-04 DIAGNOSIS — Z9221 Personal history of antineoplastic chemotherapy: Secondary | ICD-10-CM

## 2016-06-04 DIAGNOSIS — C3432 Malignant neoplasm of lower lobe, left bronchus or lung: Secondary | ICD-10-CM | POA: Diagnosis not present

## 2016-06-04 DIAGNOSIS — Z923 Personal history of irradiation: Secondary | ICD-10-CM

## 2016-06-04 DIAGNOSIS — C7931 Secondary malignant neoplasm of brain: Secondary | ICD-10-CM

## 2016-06-04 DIAGNOSIS — R220 Localized swelling, mass and lump, head: Secondary | ICD-10-CM

## 2016-06-04 DIAGNOSIS — R0602 Shortness of breath: Secondary | ICD-10-CM

## 2016-06-04 DIAGNOSIS — C7970 Secondary malignant neoplasm of unspecified adrenal gland: Secondary | ICD-10-CM

## 2016-06-04 DIAGNOSIS — R630 Anorexia: Secondary | ICD-10-CM

## 2016-06-04 NOTE — Progress Notes (Signed)
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 Diagnosis in January of 2017. Dx-  right supraclavicular lymph node biopsy.  # T2 N3 M1 stage IV disease with metastases to the brain, kidney, and adrenal gland.  # S/p WBRT radiation therapy. (Feb of 2017;]  Startingchemotherapy December 26, 2015 with carboplatinum and VP-16.  # July 12th 2017- Recurrence/ progression- on CT- Carbo-Etopside    # MOLECULAR TESTING- July 13th-[facility]     Cancer of lower lobe of left lung ([place])   12/05/2015 Initial Diagnosis Cancer of lower lobe of left lung ([place])    INTERVAL HISTORY:  Patient returns to clinic today as an acute add on for swelling and tenderness in her neck. She was last seen in clinic on Thursday. She is scheduled for palliative chemotherapy with carbo/etoposide tomorrow. She woke up yesterday and noticed her throat hurt and her neck was swollen. It hurts to turn her neck to the left but not the right. Her neck is very tender to touch. She still has a cough today and some sinus drainage. She is short of breath on exertion and has a poor appetite.  REVIEW OF SYSTEMS:  A complete 10 point review of system is done which is negative except mentioned above/history of present illness.   PAST MEDICAL HISTORY :  Past Medical History  Diagnosis Date  . Cancer of lung ([place]) 12/05/2015  . Diabetes mellitus without complication ([place])     PAST SURGICAL HISTORY :   Past Surgical History  Procedure Laterality Date  . Abdominal hysterectomy    . Peripheral vascular catheterization N/A 01/13/2016   Procedure: Glori Luis Cath Insertion;  Surgeon: Katha Cabal, MD;  Location: [place] [facility];  Service: Cardiovascular;  Laterality: N/A;    FAMILY HISTORY :  No family history on file.  SOCIAL HISTORY:   Social History  Substance Use Topics  . Smoking status: Former Smoker -- 1.00 packs/day for 49 years    Types: Cigarettes    Quit date: 11/19/2011  . Smokeless tobacco: Not on file  . Alcohol Use: No    ALLERGIES:  is allergic to amoxicillin and levofloxacin.  MEDICATIONS:  Current Outpatient Prescriptions  Medication Sig Dispense Refill  . ALPRAZolam (XANAX) 0.5 MG tablet Take 0.5 mg by mouth 3 (three) times daily as needed for anxiety.     . Calcium Carbonate-Vitamin D (CALCIUM-VITAMIN D) 500-200 MG-UNIT tablet Take 1 tablet by mouth daily.    Marland Kitchen glimepiride (AMARYL) 2 MG tablet Take 2 mg by mouth daily with breakfast.     . latanoprost (XALATAN) 0.005 % ophthalmic solution Place 1 drop into both eyes at bedtime.    . lidocaine-prilocaine (EMLA) cream Apply 1 application topically as needed. 30 g 3  . Omega-3 Fatty Acids (FISH OIL) 1000 MG CAPS Take 2,000 mg by mouth 2 (two) times daily.     Marland Kitchen omeprazole (PRILOSEC) 20 MG capsule Take 20 mg by mouth daily.     . sitaGLIPtin (JANUVIA) 100 MG tablet Take 100 mg by mouth daily.    . benzonatate (TESSALON)  200 MG capsule Take 1 capsule (200 mg total) by mouth 3 (three) times daily as needed for cough. (Patient not taking: Reported on 06/04/2016) 20 capsule 0  . KLOR-CON M20 20 MEQ tablet Reported on 06/04/2016    . prochlorperazine (COMPAZINE) 10 MG tablet Take 1 tablet (10 mg total) by mouth every 6 (six) hours as needed for nausea or vomiting. (Patient not taking: Reported on 06/04/2016) 30 tablet 3   No current facility-administered medications for this visit.   Facility-Administered Medications Ordered in Other Visits  Medication Dose Route Frequency Provider Last Rate Last Dose  . alteplase (CATHFLO ACTIVASE) injection 2 mg  2  mg Intracatheter Once PRN Forest Gleason, MD      . heparin lock flush 100 unit/mL  500 Units Intravenous Once Forest Gleason, MD      . heparin lock flush 100 unit/mL  500 Units Intracatheter Once PRN Forest Gleason, MD      . heparin lock flush 100 unit/mL  250 Units Intracatheter Once PRN Forest Gleason, MD      . sodium chloride 0.9 % injection 10 mL  10 mL Intracatheter PRN Forest Gleason, MD      . sodium chloride flush (NS) 0.9 % injection 10 mL  10 mL Intravenous PRN Forest Gleason, MD      . sodium chloride flush (NS) 0.9 % injection 10 mL  10 mL Intravenous PRN Forest Gleason, MD   10 mL at 02/01/16 1439    PHYSICAL EXAMINATION: ECOG PERFORMANCE STATUS: 1 - Symptomatic but completely ambulatory  BP 133/71 mmHg  Pulse 57  Temp(Src) 97.7 F (36.5 C) (Oral)  Resp 18  Wt 128 lb 12 oz (58.4 kg)  Filed Weights   06/04/16 1354  Weight: 128 lb 12 oz (58.4 kg)    GENERAL: Thin built well-developed; Alert, no distress and comfortable.  Alone EYES: no pallor or icterus OROPHARYNX: no thrush or ulceration; good dentition  NECK: right side swelling, tender LYMPH:  no palpable lymphadenopathy in the cervical, axillary or inguinal regions LUNGS: Decreased results of the left lower base No wheeze or crackles HEART/CVS: regular rate & rhythm and no murmurs; No lower extremity edema ABDOMEN:abdomen soft, non-tender and normal bowel sounds Musculoskeletal:no cyanosis of digits and no clubbing  PSYCH: alert & oriented x 3 with fluent speech NEURO: no focal motor/sensory deficits SKIN:  no rashes or significant lesions  LABORATORY DATA:  I have reviewed the data as listed    Component Value Date/Time   NA 134* 05/31/2016 1119   K 3.6 05/31/2016 1119   CL 101 05/31/2016 1119   CO2 25 05/31/2016 1119   GLUCOSE 126* 05/31/2016 1119   BUN 15 05/31/2016 1119   CREATININE 0.75 05/31/2016 1119   CALCIUM 9.5 05/31/2016 1119   PROT 7.9 05/31/2016 1119   ALBUMIN 3.6 05/31/2016 1119   AST 16  05/31/2016 1119   ALT 11* 05/31/2016 1119   ALKPHOS 63 05/31/2016 1119   BILITOT 0.3 05/31/2016 1119   GFRNONAA >60 05/31/2016 1119   GFRAA >60 05/31/2016 1119    No results found for: SPEP, UPEP  Lab Results  Component Value Date   WBC 7.6 05/31/2016   NEUTROABS 5.2 05/31/2016   HGB 11.3* 05/31/2016   HCT 33.0* 05/31/2016   MCV 96.6 05/31/2016   PLT 289 05/31/2016      Chemistry      Component Value Date/Time   NA 134* 05/31/2016 1119   K 3.6 05/31/2016 1119   CL 101  05/31/2016 1119   CO2 25 05/31/2016 1119   BUN 15 05/31/2016 1119   CREATININE 0.75 05/31/2016 1119      Component Value Date/Time   CALCIUM 9.5 05/31/2016 1119   ALKPHOS 63 05/31/2016 1119   AST 16 05/31/2016 1119   ALT 11* 05/31/2016 1119   BILITOT 0.3 05/31/2016 1119       RADIOGRAPHIC STUDIES: I have personally reviewed the radiological images as listed and agreed with the findings in the report. No results found.   ASSESSMENT & PLAN:   1. Recurrent small cell lung cancer- relapsed approximately 3 months post platinum treatment. July 12 CT scan shows progressive disease. Keep appointment for palliative chemotherapy with carboplatin-etoposide and neulasta tomorrow.  2. History of Brain mets- MRI 06/01/2016 results brain mets stable to decreased in size  3. Neck Swelling-  [place] results cluster metastatic lymph nodes, Dr B spoke directly to Dr Laurence Ferrari, no blood clot   Orders Placed This Encounter  Procedures  . US Soft Tissue Head/Neck    Standing Status: Future     Number of Occurrences:      Standing Expiration Date: 06/04/2017    Order Specific Question:  Reason for Exam (SYMPTOM  OR DIAGNOSIS REQUIRED)    Answer:  neck swelling    Order Specific Question:  Preferred imaging location?    Answer:  [facility]   All questions were answered. The patient knows to call the clinic with any problems, questions or concerns.   Dr. Tish Men was available for consultation and review of  plan of care for this patient.    Mayra Reel, NP 06/04/2016 2:21 PM

## 2016-06-04 NOTE — Telephone Encounter (Signed)
Called patient to inform her MRI shows everything stable - no new disease in brain.

## 2016-06-04 NOTE — Telephone Encounter (Signed)
called to report that she spoke with On call md yesterday regarding swelling at her  Port site and was told she would see her md today, but no one has called her with an appt. Please advise

## 2016-06-04 NOTE — Telephone Encounter (Signed)
I spoke with Dr B and he wants NP to see patient and she has agreed to 1:30 appt today

## 2016-06-04 NOTE — Progress Notes (Signed)
Woke up yesterday morning with a stiff neck, thought she slept on it wrong. Today R neck is swollen, looks like lymph node. It is tender to touch around to clavicle. Has had sinus problems, nose runs and headaches..Has had Right ear popping x 1 month. Recent URI completed antibiotics Saturday. Has productive sputum yellowish in color, pinkish after hard coughing.Recent chest CT and brain MRI.

## 2016-06-05 ENCOUNTER — Other Ambulatory Visit: Payer: Self-pay | Admitting: Internal Medicine

## 2016-06-05 ENCOUNTER — Other Ambulatory Visit: Payer: Self-pay | Admitting: Oncology

## 2016-06-05 ENCOUNTER — Inpatient Hospital Stay: Payer: BLUE CROSS/BLUE SHIELD

## 2016-06-05 VITALS — BP 102/69 | HR 89 | Temp 97.2°F | Resp 16

## 2016-06-05 DIAGNOSIS — C3432 Malignant neoplasm of lower lobe, left bronchus or lung: Secondary | ICD-10-CM

## 2016-06-05 DIAGNOSIS — R590 Localized enlarged lymph nodes: Secondary | ICD-10-CM

## 2016-06-05 DIAGNOSIS — R059 Cough, unspecified: Secondary | ICD-10-CM

## 2016-06-05 DIAGNOSIS — R05 Cough: Secondary | ICD-10-CM

## 2016-06-05 MED ORDER — PALONOSETRON HCL INJECTION 0.25 MG/5ML
0.2500 mg | Freq: Once | INTRAVENOUS | Status: AC
  Administered 2016-06-05: 0.25 mg via INTRAVENOUS
  Filled 2016-06-05: qty 5

## 2016-06-05 MED ORDER — SODIUM CHLORIDE 0.9% FLUSH
10.0000 mL | INTRAVENOUS | Status: DC | PRN
  Filled 2016-06-05: qty 10

## 2016-06-05 MED ORDER — HEPARIN SOD (PORK) LOCK FLUSH 100 UNIT/ML IV SOLN
500.0000 [IU] | Freq: Once | INTRAVENOUS | Status: AC
  Administered 2016-06-05: 500 [IU] via INTRAVENOUS
  Filled 2016-06-05: qty 5

## 2016-06-05 MED ORDER — SODIUM CHLORIDE 0.9 % IV SOLN
Freq: Once | INTRAVENOUS | Status: AC
  Administered 2016-06-05: 10:00:00 via INTRAVENOUS
  Filled 2016-06-05: qty 1000

## 2016-06-05 MED ORDER — SODIUM CHLORIDE 0.9 % IV SOLN
453.5000 mg | Freq: Once | INTRAVENOUS | Status: AC
  Administered 2016-06-05: 450 mg via INTRAVENOUS
  Filled 2016-06-05: qty 45

## 2016-06-05 MED ORDER — SODIUM CHLORIDE 0.9 % IV SOLN
140.0000 mg | Freq: Once | INTRAVENOUS | Status: AC
  Administered 2016-06-05: 140 mg via INTRAVENOUS
  Filled 2016-06-05: qty 7

## 2016-06-05 MED ORDER — DEXAMETHASONE SODIUM PHOSPHATE 100 MG/10ML IJ SOLN
10.0000 mg | Freq: Once | INTRAMUSCULAR | Status: AC
  Administered 2016-06-05: 10 mg via INTRAVENOUS
  Filled 2016-06-05: qty 1

## 2016-06-05 MED ORDER — BENZONATATE 200 MG PO CAPS: 200.0000 mg | ORAL_CAPSULE | Freq: Three times a day (TID) | ORAL | Status: DC | PRN

## 2016-06-05 NOTE — Progress Notes (Signed)
Patient received carboplatin '520mg'$  and Etoposide '80mg'$ /m2 with last dose administration. Doses ordered this time are carboplatin '450mg'$  and etoposide '100mg'$ /m2. Called Dr B to clarify doses. He stated to change etoposide back to '80mg'$ /m2 and to use AUC of 5 for the carboplatin. Patients weight has decreased since first cycle, therefore calculated carboplatin dose was changed to '450mg'$  (AUC 5).

## 2016-06-06 ENCOUNTER — Other Ambulatory Visit: Payer: BLUE CROSS/BLUE SHIELD

## 2016-06-06 ENCOUNTER — Ambulatory Visit: Payer: BLUE CROSS/BLUE SHIELD | Admitting: Internal Medicine

## 2016-06-06 ENCOUNTER — Inpatient Hospital Stay: Payer: BLUE CROSS/BLUE SHIELD

## 2016-06-06 VITALS — BP 113/75 | HR 81 | Temp 98.0°F | Resp 20

## 2016-06-06 DIAGNOSIS — C3432 Malignant neoplasm of lower lobe, left bronchus or lung: Secondary | ICD-10-CM | POA: Diagnosis not present

## 2016-06-06 DIAGNOSIS — R590 Localized enlarged lymph nodes: Secondary | ICD-10-CM

## 2016-06-06 MED ORDER — HEPARIN SOD (PORK) LOCK FLUSH 100 UNIT/ML IV SOLN
500.0000 [IU] | Freq: Once | INTRAVENOUS | Status: AC | PRN
  Administered 2016-06-06: 500 [IU]
  Filled 2016-06-06: qty 5

## 2016-06-06 MED ORDER — SODIUM CHLORIDE 0.9% FLUSH
10.0000 mL | INTRAVENOUS | Status: DC | PRN
  Administered 2016-06-06: 10 mL
  Filled 2016-06-06: qty 10

## 2016-06-06 MED ORDER — SODIUM CHLORIDE 0.9 % IV SOLN
10.0000 mg | Freq: Once | INTRAVENOUS | Status: AC
  Administered 2016-06-06: 10 mg via INTRAVENOUS
  Filled 2016-06-06: qty 1

## 2016-06-06 MED ORDER — SODIUM CHLORIDE 0.9 % IV SOLN
140.0000 mg | Freq: Once | INTRAVENOUS | Status: AC
  Administered 2016-06-06: 140 mg via INTRAVENOUS
  Filled 2016-06-06: qty 7

## 2016-06-06 MED ORDER — SODIUM CHLORIDE 0.9 % IV SOLN
Freq: Once | INTRAVENOUS | Status: AC
  Administered 2016-06-06: 14:00:00 via INTRAVENOUS
  Filled 2016-06-06: qty 1000

## 2016-06-07 ENCOUNTER — Inpatient Hospital Stay: Payer: BLUE CROSS/BLUE SHIELD

## 2016-06-07 ENCOUNTER — Other Ambulatory Visit: Payer: Self-pay | Admitting: Internal Medicine

## 2016-06-07 ENCOUNTER — Ambulatory Visit: Payer: BLUE CROSS/BLUE SHIELD

## 2016-06-07 VITALS — BP 110/76 | HR 82 | Temp 98.0°F | Resp 16

## 2016-06-07 DIAGNOSIS — C3432 Malignant neoplasm of lower lobe, left bronchus or lung: Secondary | ICD-10-CM | POA: Diagnosis not present

## 2016-06-07 DIAGNOSIS — R590 Localized enlarged lymph nodes: Secondary | ICD-10-CM

## 2016-06-07 MED ORDER — SODIUM CHLORIDE 0.9% FLUSH
10.0000 mL | INTRAVENOUS | Status: DC | PRN
  Filled 2016-06-07: qty 10

## 2016-06-07 MED ORDER — SODIUM CHLORIDE 0.9 % IV SOLN
10.0000 mg | Freq: Once | INTRAVENOUS | Status: AC
  Administered 2016-06-07: 10 mg via INTRAVENOUS
  Filled 2016-06-07: qty 1

## 2016-06-07 MED ORDER — HEPARIN SOD (PORK) LOCK FLUSH 100 UNIT/ML IV SOLN
INTRAVENOUS | Status: AC
  Filled 2016-06-07: qty 5

## 2016-06-07 MED ORDER — SODIUM CHLORIDE 0.9 % IV SOLN
140.0000 mg | Freq: Once | INTRAVENOUS | Status: AC
  Administered 2016-06-07: 140 mg via INTRAVENOUS
  Filled 2016-06-07: qty 7

## 2016-06-07 MED ORDER — PEGFILGRASTIM 6 MG/0.6ML ~~LOC~~ PSKT
6.0000 mg | PREFILLED_SYRINGE | Freq: Once | SUBCUTANEOUS | Status: AC
  Administered 2016-06-07: 6 mg via SUBCUTANEOUS
  Filled 2016-06-07: qty 0.6

## 2016-06-07 MED ORDER — SODIUM CHLORIDE 0.9 % IV SOLN
Freq: Once | INTRAVENOUS | Status: AC
  Administered 2016-06-07: 13:00:00 via INTRAVENOUS
  Filled 2016-06-07: qty 1000

## 2016-06-07 MED ORDER — HEPARIN SOD (PORK) LOCK FLUSH 100 UNIT/ML IV SOLN
500.0000 [IU] | Freq: Once | INTRAVENOUS | Status: AC
  Administered 2016-06-07: 500 [IU] via INTRAVENOUS

## 2016-06-15 ENCOUNTER — Inpatient Hospital Stay (HOSPITAL_BASED_OUTPATIENT_CLINIC_OR_DEPARTMENT_OTHER): Payer: BLUE CROSS/BLUE SHIELD | Admitting: Internal Medicine

## 2016-06-15 ENCOUNTER — Inpatient Hospital Stay: Payer: BLUE CROSS/BLUE SHIELD

## 2016-06-15 ENCOUNTER — Other Ambulatory Visit: Payer: Self-pay | Admitting: *Deleted

## 2016-06-15 VITALS — BP 100/67 | HR 78 | Temp 97.2°F | Resp 18 | Wt 128.1 lb

## 2016-06-15 DIAGNOSIS — Z87891 Personal history of nicotine dependence: Secondary | ICD-10-CM

## 2016-06-15 DIAGNOSIS — D61810 Antineoplastic chemotherapy induced pancytopenia: Secondary | ICD-10-CM

## 2016-06-15 DIAGNOSIS — C3432 Malignant neoplasm of lower lobe, left bronchus or lung: Secondary | ICD-10-CM | POA: Diagnosis not present

## 2016-06-15 DIAGNOSIS — C7900 Secondary malignant neoplasm of unspecified kidney and renal pelvis: Secondary | ICD-10-CM

## 2016-06-15 DIAGNOSIS — Z79899 Other long term (current) drug therapy: Secondary | ICD-10-CM

## 2016-06-15 DIAGNOSIS — C3400 Malignant neoplasm of unspecified main bronchus: Secondary | ICD-10-CM

## 2016-06-15 DIAGNOSIS — C7931 Secondary malignant neoplasm of brain: Secondary | ICD-10-CM

## 2016-06-15 DIAGNOSIS — R0602 Shortness of breath: Secondary | ICD-10-CM

## 2016-06-15 DIAGNOSIS — E119 Type 2 diabetes mellitus without complications: Secondary | ICD-10-CM

## 2016-06-15 DIAGNOSIS — R05 Cough: Secondary | ICD-10-CM

## 2016-06-15 DIAGNOSIS — C7970 Secondary malignant neoplasm of unspecified adrenal gland: Secondary | ICD-10-CM | POA: Diagnosis not present

## 2016-06-15 DIAGNOSIS — Z923 Personal history of irradiation: Secondary | ICD-10-CM

## 2016-06-15 DIAGNOSIS — R221 Localized swelling, mass and lump, neck: Secondary | ICD-10-CM

## 2016-06-15 DIAGNOSIS — R590 Localized enlarged lymph nodes: Secondary | ICD-10-CM

## 2016-06-15 DIAGNOSIS — R630 Anorexia: Secondary | ICD-10-CM

## 2016-06-15 DIAGNOSIS — Z9221 Personal history of antineoplastic chemotherapy: Secondary | ICD-10-CM

## 2016-06-15 LAB — CBC WITH DIFFERENTIAL/PLATELET
Basophils Absolute: 0 10*3/uL (ref 0–0.1)
EOS ABS: 0.1 10*3/uL (ref 0–0.7)
HCT: 28.7 % — ABNORMAL LOW (ref 35.0–47.0)
HEMOGLOBIN: 9.9 g/dL — AB (ref 12.0–16.0)
Lymphocytes Relative: 46 %
Lymphs Abs: 1.5 10*3/uL (ref 1.0–3.6)
MCH: 32.5 pg (ref 26.0–34.0)
MCHC: 34.3 g/dL (ref 32.0–36.0)
MCV: 94.9 fL (ref 80.0–100.0)
MONO ABS: 0.3 10*3/uL (ref 0.2–0.9)
Neutro Abs: 1.3 10*3/uL — ABNORMAL LOW (ref 1.4–6.5)
PLATELETS: 61 10*3/uL — AB (ref 150–440)
RBC: 3.03 MIL/uL — ABNORMAL LOW (ref 3.80–5.20)
RDW: 13 % (ref 11.5–14.5)
WBC: 3.3 10*3/uL — ABNORMAL LOW (ref 3.6–11.0)

## 2016-06-15 LAB — COMPREHENSIVE METABOLIC PANEL
ALBUMIN: 3.6 g/dL (ref 3.5–5.0)
ALT: 12 U/L — ABNORMAL LOW (ref 14–54)
ANION GAP: 9 (ref 5–15)
AST: 15 U/L (ref 15–41)
Alkaline Phosphatase: 70 U/L (ref 38–126)
BILIRUBIN TOTAL: 0.5 mg/dL (ref 0.3–1.2)
BUN: 15 mg/dL (ref 6–20)
CALCIUM: 8.8 mg/dL — AB (ref 8.9–10.3)
CO2: 25 mmol/L (ref 22–32)
CREATININE: 0.84 mg/dL (ref 0.44–1.00)
Chloride: 103 mmol/L (ref 101–111)
Glucose, Bld: 94 mg/dL (ref 65–99)
Potassium: 3.1 mmol/L — ABNORMAL LOW (ref 3.5–5.1)
SODIUM: 137 mmol/L (ref 135–145)
TOTAL PROTEIN: 7 g/dL (ref 6.5–8.1)

## 2016-06-15 LAB — LACTATE DEHYDROGENASE: LDH: 479 U/L — ABNORMAL HIGH (ref 98–192)

## 2016-06-15 LAB — SAMPLE TO BLOOD BANK

## 2016-06-15 NOTE — Assessment & Plan Note (Addendum)
Recurrent small cell lung cancer- relapsed approximately 3 months post platinum treatment.  # Status post cycle #1 carboplatin etoposide- 10 days ago; clinical response noted with improvement of the neck adenopathy.  # Mild pancytopenia as expected from chemotherapy- otherwise no other side effects noted.  # History of brain metastases-MRI July 14-shows treated brain metastases no new lesions.   I reviewed the images myself and with the patient and family in detail.   # Follow-up with me in August 7 for treatment/labs M.D.

## 2016-06-15 NOTE — Progress Notes (Signed)
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 Diagnosis in January of 2017. Dx-  right supraclavicular lymph node biopsy.  # T2 N3 M1 stage IV disease with metastases to the brain, kidney, and adrenal gland.  # S/p WBRT radiation therapy. (Feb of 2017;]  Startingchemotherapy December 26, 2015 with carboplatinum and VP-16.  # July 12th 2017- Recurrence/ progression- on CT- Carbo-Etopside    # MOLECULAR TESTING- July 13th-[facility]- STABLE; [facility]- NOT enough tissue      Cancer of lower lobe of left lung ([place])   12/05/2015 Initial Diagnosis    Cancer of lower lobe of left lung ([place])      INTERVAL HISTORY:  Cynthia Carson 65 y.o.  female pleasant patient above history of recurrent extensive stage small cell lung cancer is here for follow-up. Patient is status post cycle #110 days ago.  Interestingly patient is noted improvement of her right neck adenopathy. Cough slightly improved. Shortness of breath no worse. No hemoptysis.  Denies having any new headaches. Denies any chest pain. No nausea vomiting with the chemotherapy.  REVIEW OF SYSTEMS:  A complete 10 point review of system is done which is negative except mentioned above/history of present illness.   PAST MEDICAL HISTORY :  Past Medical History:  Diagnosis Date  . Cancer of lung ([place]) 12/05/2015  . Diabetes mellitus without complication ([place])     PAST SURGICAL HISTORY :   Past Surgical History:  Procedure Laterality Date  . ABDOMINAL HYSTERECTOMY    . PERIPHERAL VASCULAR CATHETERIZATION N/A 01/13/2016   Procedure: Glori Luis  Cath Insertion;  Surgeon: Katha Cabal, MD;  Location: [place] [facility];  Service: Cardiovascular;  Laterality: N/A;    FAMILY HISTORY :  No family history on file.  SOCIAL HISTORY:   Social History  Substance Use Topics  . Smoking status: Former Smoker    Packs/day: 1.00    Years: 49.00    Types: Cigarettes    Quit date: 11/19/2011  . Smokeless tobacco: Not on file  . Alcohol use No    ALLERGIES:  is allergic to amoxicillin and levofloxacin.  MEDICATIONS:  Current Outpatient Prescriptions  Medication Sig Dispense Refill  . ALPRAZolam (XANAX) 0.5 MG tablet Take 0.5 mg by mouth 3 (three) times daily as needed for anxiety.     . benzonatate (TESSALON) 200 MG capsule Take 1 capsule (200 mg total) by mouth 3 (three) times daily as needed for cough. 20 capsule 0  . Calcium Carbonate-Vitamin D (CALCIUM-VITAMIN D) 500-200 MG-UNIT tablet Take 1 tablet by mouth daily.    Marland Kitchen glimepiride (AMARYL) 2 MG tablet Take 2 mg by mouth daily with breakfast.     . latanoprost (XALATAN) 0.005 % ophthalmic solution Place 1 drop into both eyes at bedtime.    . lidocaine-prilocaine (EMLA) cream Apply 1 application topically as needed. 30 g 3  . Omega-3 Fatty Acids (FISH OIL) 1000 MG CAPS Take 2,000 mg by mouth 2 (two) times daily.     Marland Kitchen omeprazole (PRILOSEC) 20 MG capsule Take 20 mg by mouth daily.     Marland Kitchen  sitaGLIPtin (JANUVIA) 100 MG tablet Take 100 mg by mouth daily.    . prochlorperazine (COMPAZINE) 10 MG tablet Take 1 tablet (10 mg total) by mouth every 6 (six) hours as needed for nausea or vomiting. (Patient not taking: Reported on 06/04/2016) 30 tablet 3   No current facility-administered medications for this visit.    Facility-Administered Medications Ordered in Other Visits  Medication Dose Route Frequency Provider Last Rate Last Dose  . alteplase (CATHFLO ACTIVASE) injection 2 mg  2 mg Intracatheter Once PRN Forest Gleason, MD      . heparin lock flush 100 unit/mL  500 Units Intravenous Once  Forest Gleason, MD      . heparin lock flush 100 unit/mL  500 Units Intracatheter Once PRN Forest Gleason, MD      . heparin lock flush 100 unit/mL  250 Units Intracatheter Once PRN Forest Gleason, MD      . sodium chloride 0.9 % injection 10 mL  10 mL Intracatheter PRN Forest Gleason, MD      . sodium chloride flush (NS) 0.9 % injection 10 mL  10 mL Intravenous PRN Forest Gleason, MD      . sodium chloride flush (NS) 0.9 % injection 10 mL  10 mL Intravenous PRN Forest Gleason, MD   10 mL at 02/01/16 1439    PHYSICAL EXAMINATION: ECOG PERFORMANCE STATUS: 1 - Symptomatic but completely ambulatory  BP 100/67 (BP Location: Left Arm, Patient Position: Sitting)   Pulse 78   Temp 97.2 F (36.2 C) (Tympanic)   Resp 18   Wt 128 lb 2 oz (58.1 kg)   BMI 21.32 kg/m   Filed Weights   06/15/16 1132  Weight: 128 lb 2 oz (58.1 kg)    GENERAL: Thin built well-developed; Alert, no distress and comfortable.  Accompanied by her husband. EYES: no pallor or icterus OROPHARYNX: no thrush or ulceration; good dentition  NECK: supple, no masses felt LYMPH:  Right supraclavicular adenopathy improved. ; No axillary or inguinal regions LUNGS: Decreased results of the left lower base No wheeze or crackles HEART/CVS: regular rate & rhythm and no murmurs; No lower extremity edema ABDOMEN:abdomen soft, non-tender and normal bowel sounds Musculoskeletal:no cyanosis of digits and no clubbing  PSYCH: alert & oriented x 3 with fluent speech NEURO: no focal motor/sensory deficits SKIN:  no rashes or significant lesions  LABORATORY DATA:  I have reviewed the data as listed    Component Value Date/Time   NA 137 06/15/2016 1050   K 3.1 (L) 06/15/2016 1050   CL 103 06/15/2016 1050   CO2 25 06/15/2016 1050   GLUCOSE 94 06/15/2016 1050   BUN 15 06/15/2016 1050   CREATININE 0.84 06/15/2016 1050   CALCIUM 8.8 (L) 06/15/2016 1050   PROT 7.0 06/15/2016 1050   ALBUMIN 3.6 06/15/2016 1050   AST 15 06/15/2016 1050   ALT 12  (L) 06/15/2016 1050   ALKPHOS 70 06/15/2016 1050   BILITOT 0.5 06/15/2016 1050   GFRNONAA >60 06/15/2016 1050   GFRAA >60 06/15/2016 1050    No results found for: SPEP, UPEP  Lab Results  Component Value Date   WBC 3.3 (L) 06/15/2016   NEUTROABS 1.3 (L) 06/15/2016   HGB 9.9 (L) 06/15/2016   HCT 28.7 (L) 06/15/2016   MCV 94.9 06/15/2016   PLT 61 (L) 06/15/2016      Chemistry      Component Value Date/Time   NA 137 06/15/2016 1050   K 3.1 (L) 06/15/2016 1050  CL 103 06/15/2016 1050   CO2 25 06/15/2016 1050   BUN 15 06/15/2016 1050   CREATININE 0.84 06/15/2016 1050      Component Value Date/Time   CALCIUM 8.8 (L) 06/15/2016 1050   ALKPHOS 70 06/15/2016 1050   AST 15 06/15/2016 1050   ALT 12 (L) 06/15/2016 1050   BILITOT 0.5 06/15/2016 1050       RADIOGRAPHIC STUDIES: I have personally reviewed the radiological images as listed and agreed with the findings in the report. No results found.   ASSESSMENT & PLAN:  Cancer of lower lobe of left lung ([facility]) Recurrent small cell lung cancer- relapsed approximately 3 months post platinum treatment.  # Status post cycle #1 carboplatin etoposide- 10 days ago; clinical response noted with improvement of the neck adenopathy.  # Mild pancytopenia as expected from chemotherapy- otherwise no other side effects noted.  # History of brain metastases-MRI July 14-shows treated brain metastases no new lesions.   I reviewed the images myself and with the patient and family in detail.   # Follow-up with me in August 7 for treatment/labs M.D.   I reviewed the images myself and with the patient and family in detail. A copy of this report was given.  No orders of the defined types were placed in this encounter.  All questions were answered. The patient knows to call the clinic with any problems, questions or concerns.      Cammie Sickle, MD 06/15/2016 5:38 PM

## 2016-06-25 ENCOUNTER — Encounter (INDEPENDENT_AMBULATORY_CARE_PROVIDER_SITE_OTHER): Payer: Self-pay

## 2016-06-25 ENCOUNTER — Inpatient Hospital Stay: Payer: BLUE CROSS/BLUE SHIELD

## 2016-06-25 ENCOUNTER — Inpatient Hospital Stay: Payer: BLUE CROSS/BLUE SHIELD | Admitting: *Deleted

## 2016-06-25 ENCOUNTER — Inpatient Hospital Stay: Payer: BLUE CROSS/BLUE SHIELD | Attending: Internal Medicine | Admitting: Internal Medicine

## 2016-06-25 VITALS — BP 110/75 | HR 81

## 2016-06-25 VITALS — BP 103/73 | HR 94 | Temp 98.2°F | Resp 18 | Wt 131.5 lb

## 2016-06-25 DIAGNOSIS — Z923 Personal history of irradiation: Secondary | ICD-10-CM | POA: Insufficient documentation

## 2016-06-25 DIAGNOSIS — R590 Localized enlarged lymph nodes: Secondary | ICD-10-CM

## 2016-06-25 DIAGNOSIS — Z87891 Personal history of nicotine dependence: Secondary | ICD-10-CM | POA: Diagnosis not present

## 2016-06-25 DIAGNOSIS — C7970 Secondary malignant neoplasm of unspecified adrenal gland: Secondary | ICD-10-CM

## 2016-06-25 DIAGNOSIS — R1310 Dysphagia, unspecified: Secondary | ICD-10-CM | POA: Diagnosis not present

## 2016-06-25 DIAGNOSIS — K1230 Oral mucositis (ulcerative), unspecified: Secondary | ICD-10-CM | POA: Diagnosis not present

## 2016-06-25 DIAGNOSIS — C7931 Secondary malignant neoplasm of brain: Secondary | ICD-10-CM

## 2016-06-25 DIAGNOSIS — M25511 Pain in right shoulder: Secondary | ICD-10-CM | POA: Insufficient documentation

## 2016-06-25 DIAGNOSIS — C3432 Malignant neoplasm of lower lobe, left bronchus or lung: Secondary | ICD-10-CM | POA: Diagnosis not present

## 2016-06-25 DIAGNOSIS — C7900 Secondary malignant neoplasm of unspecified kidney and renal pelvis: Secondary | ICD-10-CM

## 2016-06-25 DIAGNOSIS — R05 Cough: Secondary | ICD-10-CM | POA: Diagnosis not present

## 2016-06-25 DIAGNOSIS — Z5111 Encounter for antineoplastic chemotherapy: Secondary | ICD-10-CM | POA: Diagnosis not present

## 2016-06-25 DIAGNOSIS — Z7689 Persons encountering health services in other specified circumstances: Secondary | ICD-10-CM | POA: Diagnosis not present

## 2016-06-25 DIAGNOSIS — Z79899 Other long term (current) drug therapy: Secondary | ICD-10-CM | POA: Insufficient documentation

## 2016-06-25 DIAGNOSIS — E119 Type 2 diabetes mellitus without complications: Secondary | ICD-10-CM | POA: Diagnosis not present

## 2016-06-25 LAB — CBC WITH DIFFERENTIAL/PLATELET
BASOS ABS: 0 10*3/uL (ref 0–0.1)
BASOS PCT: 0 %
Eosinophils Absolute: 0.1 10*3/uL (ref 0–0.7)
Eosinophils Relative: 1 %
HEMATOCRIT: 28.5 % — AB (ref 35.0–47.0)
HEMOGLOBIN: 9.9 g/dL — AB (ref 12.0–16.0)
Lymphocytes Relative: 18 %
Lymphs Abs: 1.5 10*3/uL (ref 1.0–3.6)
MCH: 33.1 pg (ref 26.0–34.0)
MCHC: 34.8 g/dL (ref 32.0–36.0)
MCV: 95.4 fL (ref 80.0–100.0)
MONOS PCT: 12 %
Monocytes Absolute: 1.1 10*3/uL — ABNORMAL HIGH (ref 0.2–0.9)
NEUTROS ABS: 5.9 10*3/uL (ref 1.4–6.5)
NEUTROS PCT: 69 %
Platelets: 150 10*3/uL (ref 150–440)
RBC: 2.99 MIL/uL — ABNORMAL LOW (ref 3.80–5.20)
RDW: 13.5 % (ref 11.5–14.5)
WBC: 8.6 10*3/uL (ref 3.6–11.0)

## 2016-06-25 LAB — COMPREHENSIVE METABOLIC PANEL
ALBUMIN: 3.9 g/dL (ref 3.5–5.0)
ALK PHOS: 65 U/L (ref 38–126)
ALT: 24 U/L (ref 14–54)
ANION GAP: 7 (ref 5–15)
AST: 25 U/L (ref 15–41)
BUN: 12 mg/dL (ref 6–20)
CHLORIDE: 105 mmol/L (ref 101–111)
CO2: 25 mmol/L (ref 22–32)
Calcium: 8.7 mg/dL — ABNORMAL LOW (ref 8.9–10.3)
Creatinine, Ser: 0.75 mg/dL (ref 0.44–1.00)
GFR calc Af Amer: >60 mL/min (ref >60)
GFR calc non Af Amer: >60 mL/min (ref >60)
GLUCOSE: 130 mg/dL — AB (ref 65–99)
POTASSIUM: 3.5 mmol/L (ref 3.5–5.1)
Sodium: 137 mmol/L (ref 135–145)
Total Bilirubin: 0.4 mg/dL (ref 0.3–1.2)
Total Protein: 6.8 g/dL (ref 6.5–8.1)

## 2016-06-25 MED ORDER — SODIUM CHLORIDE 0.9 % IV SOLN
10.0000 mg | Freq: Once | INTRAVENOUS | Status: AC
  Administered 2016-06-25: 10 mg via INTRAVENOUS
  Filled 2016-06-25: qty 1

## 2016-06-25 MED ORDER — SODIUM CHLORIDE 0.9 % IV SOLN
140.0000 mg | Freq: Once | INTRAVENOUS | Status: AC
  Administered 2016-06-25: 140 mg via INTRAVENOUS
  Filled 2016-06-25: qty 7

## 2016-06-25 MED ORDER — PALONOSETRON HCL INJECTION 0.25 MG/5ML
0.2500 mg | Freq: Once | INTRAVENOUS | Status: AC
  Administered 2016-06-25: 0.25 mg via INTRAVENOUS
  Filled 2016-06-25: qty 5

## 2016-06-25 MED ORDER — HEPARIN SOD (PORK) LOCK FLUSH 100 UNIT/ML IV SOLN
500.0000 [IU] | Freq: Once | INTRAVENOUS | Status: AC | PRN
  Administered 2016-06-25: 500 [IU]
  Filled 2016-06-25 (×2): qty 5

## 2016-06-25 MED ORDER — SODIUM CHLORIDE 0.9 % IV SOLN
453.5000 mg | Freq: Once | INTRAVENOUS | Status: AC
  Administered 2016-06-25: 450 mg via INTRAVENOUS
  Filled 2016-06-25: qty 45

## 2016-06-25 MED ORDER — SODIUM CHLORIDE 0.9 % IV SOLN
Freq: Once | INTRAVENOUS | Status: AC
  Administered 2016-06-25: 11:00:00 via INTRAVENOUS
  Filled 2016-06-25: qty 1000

## 2016-06-25 NOTE — Progress Notes (Signed)
Patient states her throat is sore.  Noticed it yesterday, worse today.  States cough is deeper.  Also states she "blacked out" on Saturday.  She had been lying down and got up to go to the kitchen. She remembers waking up on the floor.  States she is slightly dizzy when first standing.

## 2016-06-25 NOTE — Progress Notes (Signed)
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 Diagnosis in January of 2017. Dx-  right supraclavicular lymph node biopsy.  # T2 N3 M1 stage IV disease with metastases to the brain, kidney, and adrenal gland.  # S/p WBRT radiation therapy. (Feb of 2017;]  Startingchemotherapy December 26, 2015 with carboplatinum and VP-16.  # July 12th 2017- Recurrence/ progression- on CT- Carbo-Etopside    # MOLECULAR TESTING- July 13th-[facility]- STABLE; [facility]- NOT enough tissue      Cancer of lower lobe of left lung ([place])   12/05/2015 Initial Diagnosis    Cancer of lower lobe of left lung ([place])      INTERVAL HISTORY:  Cynthia Carson 65 y.o.  female pleasant patient above history of recurrent extensive stage small cell lung cancer is here for follow-up. Patient is status post cycle #1 three weeks  ago.  Cough slightly improved. Shortness of breath no worse. No hemoptysis. She has gained weight.  She had episode of presyncope- while standing all of a sudden.  Denies any chest pain. No nausea vomiting with the chemotherapy.  REVIEW OF SYSTEMS:  A complete 10 point review of system is done which is negative except mentioned above/history of present illness.   PAST MEDICAL HISTORY :  Past Medical History:  Diagnosis Date  . Cancer of lung ([place]) 12/05/2015  . Diabetes mellitus without complication ([place])     PAST SURGICAL HISTORY :   Past Surgical History:  Procedure Laterality Date  . ABDOMINAL HYSTERECTOMY    . PERIPHERAL VASCULAR CATHETERIZATION N/A 01/13/2016   Procedure: Glori Luis Cath  Insertion;  Surgeon: Katha Cabal, MD;  Location: [place] [facility];  Service: Cardiovascular;  Laterality: N/A;    FAMILY HISTORY :  No family history on file.  SOCIAL HISTORY:   Social History  Substance Use Topics  . Smoking status: Former Smoker    Packs/day: 1.00    Years: 49.00    Types: Cigarettes    Quit date: 11/19/2011  . Smokeless tobacco: Not on file  . Alcohol use No    ALLERGIES:  is allergic to amoxicillin and levofloxacin.  MEDICATIONS:  Current Outpatient Prescriptions  Medication Sig Dispense Refill  . ALPRAZolam (XANAX) 0.5 MG tablet Take 0.5 mg by mouth 3 (three) times daily as needed for anxiety.     . benzonatate (TESSALON) 200 MG capsule Take 1 capsule (200 mg total) by mouth 3 (three) times daily as needed for cough. 20 capsule 0  . Calcium Carbonate-Vitamin D (CALCIUM-VITAMIN D) 500-200 MG-UNIT tablet Take 1 tablet by mouth daily.    Marland Kitchen glimepiride (AMARYL) 2 MG tablet Take 2 mg by mouth daily with breakfast.     . latanoprost (XALATAN) 0.005 % ophthalmic solution Place 1 drop into both eyes at bedtime.    . lidocaine-prilocaine (EMLA) cream Apply 1 application topically as needed. 30 g 3  . Omega-3 Fatty Acids (FISH OIL) 1000 MG CAPS Take 2,000 mg by mouth 2 (two) times daily.     Marland Kitchen omeprazole (PRILOSEC) 20 MG capsule Take 20 mg by mouth daily.     Marland Kitchen  prochlorperazine (COMPAZINE) 10 MG tablet Take 1 tablet (10 mg total) by mouth every 6 (six) hours as needed for nausea or vomiting. 30 tablet 3  . sitaGLIPtin (JANUVIA) 100 MG tablet Take 100 mg by mouth daily.     No current facility-administered medications for this visit.    Facility-Administered Medications Ordered in Other Visits  Medication Dose Route Frequency Provider Last Rate Last Dose  . alteplase (CATHFLO ACTIVASE) injection 2 mg  2 mg Intracatheter Once PRN Forest Gleason, MD      . heparin lock flush 100 unit/mL  500 Units Intravenous Once Forest Gleason, MD      . heparin lock flush 100  unit/mL  500 Units Intracatheter Once PRN Forest Gleason, MD      . heparin lock flush 100 unit/mL  250 Units Intracatheter Once PRN Forest Gleason, MD      . sodium chloride 0.9 % injection 10 mL  10 mL Intracatheter PRN Forest Gleason, MD      . sodium chloride flush (NS) 0.9 % injection 10 mL  10 mL Intravenous PRN Forest Gleason, MD      . sodium chloride flush (NS) 0.9 % injection 10 mL  10 mL Intravenous PRN Forest Gleason, MD   10 mL at 02/01/16 1439    PHYSICAL EXAMINATION: ECOG PERFORMANCE STATUS: 1 - Symptomatic but completely ambulatory  BP 103/73 (BP Location: Left Arm, Patient Position: Sitting)   Pulse 94   Temp 98.2 F (36.8 C) (Tympanic)   Resp 18   Wt 131 lb 8 oz (59.6 kg)   BMI 21.88 kg/m   Filed Weights   06/25/16 0926  Weight: 131 lb 8 oz (59.6 kg)    GENERAL: Thin built well-developed; Alert, no distress and comfortable. She is alone. EYES: no pallor or icterus OROPHARYNX: no thrush or ulceration; good dentition  NECK: supple, no masses felt LYMPH:  Right supraclavicular adenopathy improved. ; No axillary or inguinal regions LUNGS: Decreased results of the left lower base No wheeze or crackles HEART/CVS: regular rate & rhythm and no murmurs; No lower extremity edema ABDOMEN:abdomen soft, non-tender and normal bowel sounds Musculoskeletal:no cyanosis of digits and no clubbing  PSYCH: alert & oriented x 3 with fluent speech NEURO: no focal motor/sensory deficits SKIN:  no rashes or significant lesions  LABORATORY DATA:  I have reviewed the data as listed    Component Value Date/Time   NA 137 06/25/2016 0900   K 3.5 06/25/2016 0900   CL 105 06/25/2016 0900   CO2 25 06/25/2016 0900   GLUCOSE 130 (H) 06/25/2016 0900   BUN 12 06/25/2016 0900   CREATININE 0.75 06/25/2016 0900   CALCIUM 8.7 (L) 06/25/2016 0900   PROT 6.8 06/25/2016 0900   ALBUMIN 3.9 06/25/2016 0900   AST 25 06/25/2016 0900   ALT 24 06/25/2016 0900   ALKPHOS 65 06/25/2016 0900   BILITOT 0.4  06/25/2016 0900   GFRNONAA >60 06/25/2016 0900   GFRAA >60 06/25/2016 0900    No results found for: SPEP, UPEP  Lab Results  Component Value Date   WBC 8.6 06/25/2016   NEUTROABS 5.9 06/25/2016   HGB 9.9 (L) 06/25/2016   HCT 28.5 (L) 06/25/2016   MCV 95.4 06/25/2016   PLT 150 06/25/2016      Chemistry      Component Value Date/Time   NA 137 06/25/2016 0900   K 3.5 06/25/2016 0900   CL 105 06/25/2016 0900   CO2 25 06/25/2016 0900   BUN  12 06/25/2016 0900   CREATININE 0.75 06/25/2016 0900      Component Value Date/Time   CALCIUM 8.7 (L) 06/25/2016 0900   ALKPHOS 65 06/25/2016 0900   AST 25 06/25/2016 0900   ALT 24 06/25/2016 0900   BILITOT 0.4 06/25/2016 0900       RADIOGRAPHIC STUDIES: I have personally reviewed the radiological images as listed and agreed with the findings in the report. No results found.   ASSESSMENT & PLAN:  Cancer of lower lobe of left lung (HCC) Recurrent small cell lung cancer- relapsed approximately 3 months post platinum treatment.  # Status post cycle #1 carboplatin etoposide- 3 weeks ago clinical response noted with improvement of the neck adenopathy.  # proceed with #2 to day. Labs look okay mild anemia.   # pre-syncope- sec to orthostasis- resolved.   # mucositis- recommend salt/baking soda rinses.   # History of brain metastases-MRI July 14-shows treated brain metastases no new lesions.  # labs in 10 days; follow up with me in 3 weeks/chemo.   #   We'll plan CT after 3 cycles. Orders Placed This Encounter  Procedures  . CBC with Differential    Standing Status:   Standing    Number of Occurrences:   12    Standing Expiration Date:   06/25/2017  . Comprehensive metabolic panel    Standing Status:   Standing    Number of Occurrences:   12    Standing Expiration Date:   06/25/2017  . CBC with Differential    Standing Status:   Future    Standing Expiration Date:   06/25/2017  . Comprehensive metabolic panel    Standing  Status:   Future    Standing Expiration Date:   06/25/2017   All questions were answered. The patient knows to call the clinic with any problems, questions or concerns.      Cammie Sickle, MD 06/25/2016 1:36 PM

## 2016-06-25 NOTE — Assessment & Plan Note (Addendum)
Recurrent small cell lung cancer- relapsed approximately 3 months post platinum treatment.  # Status post cycle #1 carboplatin etoposide- 3 weeks ago clinical response noted with improvement of the neck adenopathy.  # proceed with #2 to day. Labs look okay mild anemia.   # pre-syncope- sec to orthostasis- resolved.   # mucositis- recommend salt/baking soda rinses.   # History of brain metastases-MRI July 14-shows treated brain metastases no new lesions.  # labs in 10 days; follow up with me in 3 weeks/chemo.   #

## 2016-06-26 ENCOUNTER — Inpatient Hospital Stay: Payer: BLUE CROSS/BLUE SHIELD

## 2016-06-26 VITALS — BP 102/69 | HR 84 | Temp 97.1°F | Resp 18

## 2016-06-26 DIAGNOSIS — R590 Localized enlarged lymph nodes: Secondary | ICD-10-CM

## 2016-06-26 DIAGNOSIS — C3432 Malignant neoplasm of lower lobe, left bronchus or lung: Secondary | ICD-10-CM

## 2016-06-26 MED ORDER — SODIUM CHLORIDE 0.9 % IV SOLN
140.0000 mg | Freq: Once | INTRAVENOUS | Status: AC
  Administered 2016-06-26: 140 mg via INTRAVENOUS
  Filled 2016-06-26: qty 7

## 2016-06-26 MED ORDER — SODIUM CHLORIDE 0.9 % IV SOLN
Freq: Once | INTRAVENOUS | Status: AC
  Administered 2016-06-26: 14:00:00 via INTRAVENOUS
  Filled 2016-06-26: qty 1000

## 2016-06-26 MED ORDER — HEPARIN SOD (PORK) LOCK FLUSH 100 UNIT/ML IV SOLN
500.0000 [IU] | Freq: Once | INTRAVENOUS | Status: AC | PRN
  Administered 2016-06-26: 500 [IU]
  Filled 2016-06-26: qty 5

## 2016-06-26 MED ORDER — DEXAMETHASONE SODIUM PHOSPHATE 100 MG/10ML IJ SOLN
10.0000 mg | Freq: Once | INTRAMUSCULAR | Status: AC
  Administered 2016-06-26: 10 mg via INTRAVENOUS
  Filled 2016-06-26: qty 1

## 2016-06-26 MED ORDER — SODIUM CHLORIDE 0.9% FLUSH
10.0000 mL | INTRAVENOUS | Status: DC | PRN
  Administered 2016-06-26: 10 mL
  Filled 2016-06-26: qty 10

## 2016-06-27 ENCOUNTER — Inpatient Hospital Stay: Payer: BLUE CROSS/BLUE SHIELD

## 2016-06-27 VITALS — BP 106/84 | HR 88 | Temp 98.8°F | Resp 18

## 2016-06-27 DIAGNOSIS — C3432 Malignant neoplasm of lower lobe, left bronchus or lung: Secondary | ICD-10-CM | POA: Diagnosis not present

## 2016-06-27 DIAGNOSIS — R590 Localized enlarged lymph nodes: Secondary | ICD-10-CM

## 2016-06-27 MED ORDER — SODIUM CHLORIDE 0.9 % IV SOLN
140.0000 mg | Freq: Once | INTRAVENOUS | Status: AC
  Administered 2016-06-27: 140 mg via INTRAVENOUS
  Filled 2016-06-27: qty 7

## 2016-06-27 MED ORDER — SODIUM CHLORIDE 0.9% FLUSH
10.0000 mL | INTRAVENOUS | Status: DC | PRN
  Administered 2016-06-27: 10 mL
  Filled 2016-06-27: qty 10

## 2016-06-27 MED ORDER — SODIUM CHLORIDE 0.9 % IV SOLN
Freq: Once | INTRAVENOUS | Status: AC
  Administered 2016-06-27: 14:00:00 via INTRAVENOUS
  Filled 2016-06-27: qty 1000

## 2016-06-27 MED ORDER — PEGFILGRASTIM 6 MG/0.6ML ~~LOC~~ PSKT
6.0000 mg | PREFILLED_SYRINGE | Freq: Once | SUBCUTANEOUS | Status: AC
  Administered 2016-06-27: 6 mg via SUBCUTANEOUS
  Filled 2016-06-27: qty 0.6

## 2016-06-27 MED ORDER — SODIUM CHLORIDE 0.9 % IV SOLN
10.0000 mg | Freq: Once | INTRAVENOUS | Status: AC
  Administered 2016-06-27: 10 mg via INTRAVENOUS
  Filled 2016-06-27: qty 1

## 2016-06-27 MED ORDER — HEPARIN SOD (PORK) LOCK FLUSH 100 UNIT/ML IV SOLN
500.0000 [IU] | Freq: Once | INTRAVENOUS | Status: AC | PRN
  Administered 2016-06-27: 500 [IU]
  Filled 2016-06-27: qty 5

## 2016-06-29 ENCOUNTER — Ambulatory Visit: Payer: BLUE CROSS/BLUE SHIELD

## 2016-07-04 ENCOUNTER — Ambulatory Visit: Payer: BLUE CROSS/BLUE SHIELD | Admitting: Internal Medicine

## 2016-07-04 ENCOUNTER — Other Ambulatory Visit: Payer: BLUE CROSS/BLUE SHIELD

## 2016-07-05 ENCOUNTER — Inpatient Hospital Stay: Payer: BLUE CROSS/BLUE SHIELD

## 2016-07-05 DIAGNOSIS — C3432 Malignant neoplasm of lower lobe, left bronchus or lung: Secondary | ICD-10-CM

## 2016-07-05 LAB — COMPREHENSIVE METABOLIC PANEL
ALT: 26 U/L (ref 14–54)
ANION GAP: 7 (ref 5–15)
AST: 28 U/L (ref 15–41)
Albumin: 3.7 g/dL (ref 3.5–5.0)
Alkaline Phosphatase: 84 U/L (ref 38–126)
BILIRUBIN TOTAL: 0.7 mg/dL (ref 0.3–1.2)
BUN: 23 mg/dL — AB (ref 6–20)
CHLORIDE: 107 mmol/L (ref 101–111)
CO2: 24 mmol/L (ref 22–32)
Calcium: 8.8 mg/dL — ABNORMAL LOW (ref 8.9–10.3)
Creatinine, Ser: 0.81 mg/dL (ref 0.44–1.00)
Glucose, Bld: 92 mg/dL (ref 65–99)
POTASSIUM: 3.3 mmol/L — AB (ref 3.5–5.1)
Sodium: 138 mmol/L (ref 135–145)
TOTAL PROTEIN: 7.1 g/dL (ref 6.5–8.1)

## 2016-07-05 LAB — CBC WITH DIFFERENTIAL/PLATELET
Basophils Absolute: 0 10*3/uL (ref 0–0.1)
Basophils Relative: 1 %
Eosinophils Absolute: 0 10*3/uL (ref 0–0.7)
HEMATOCRIT: 23.7 % — AB (ref 35.0–47.0)
Hemoglobin: 8.2 g/dL — ABNORMAL LOW (ref 12.0–16.0)
Lymphs Abs: 1.4 10*3/uL (ref 1.0–3.6)
MCH: 32.6 pg (ref 26.0–34.0)
MCHC: 34.4 g/dL (ref 32.0–36.0)
MCV: 94.9 fL (ref 80.0–100.0)
MONO ABS: 0.7 10*3/uL (ref 0.2–0.9)
NEUTROS ABS: 3 10*3/uL (ref 1.4–6.5)
Neutrophils Relative %: 57 %
PLATELETS: 75 10*3/uL — AB (ref 150–440)
RBC: 2.5 MIL/uL — ABNORMAL LOW (ref 3.80–5.20)
RDW: 14 % (ref 11.5–14.5)
WBC: 5.2 10*3/uL (ref 3.6–11.0)

## 2016-07-16 ENCOUNTER — Inpatient Hospital Stay (HOSPITAL_BASED_OUTPATIENT_CLINIC_OR_DEPARTMENT_OTHER): Payer: BLUE CROSS/BLUE SHIELD | Admitting: Internal Medicine

## 2016-07-16 ENCOUNTER — Other Ambulatory Visit: Payer: Self-pay | Admitting: *Deleted

## 2016-07-16 ENCOUNTER — Ambulatory Visit
Admission: RE | Admit: 2016-07-16 | Discharge: 2016-07-16 | Disposition: A | Discharge status: Home / Self Care | Payer: BLUE CROSS/BLUE SHIELD | Source: Ambulatory Visit | Attending: Internal Medicine | Admitting: Internal Medicine

## 2016-07-16 ENCOUNTER — Inpatient Hospital Stay: Payer: BLUE CROSS/BLUE SHIELD

## 2016-07-16 VITALS — BP 119/75 | HR 103 | Temp 98.1°F | Resp 18 | Wt 132.3 lb

## 2016-07-16 DIAGNOSIS — C3432 Malignant neoplasm of lower lobe, left bronchus or lung: Secondary | ICD-10-CM

## 2016-07-16 DIAGNOSIS — Z7689 Persons encountering health services in other specified circumstances: Secondary | ICD-10-CM | POA: Diagnosis not present

## 2016-07-16 DIAGNOSIS — R609 Edema, unspecified: Secondary | ICD-10-CM | POA: Diagnosis not present

## 2016-07-16 DIAGNOSIS — R1310 Dysphagia, unspecified: Secondary | ICD-10-CM

## 2016-07-16 DIAGNOSIS — E119 Type 2 diabetes mellitus without complications: Secondary | ICD-10-CM

## 2016-07-16 DIAGNOSIS — C7931 Secondary malignant neoplasm of brain: Secondary | ICD-10-CM | POA: Diagnosis not present

## 2016-07-16 DIAGNOSIS — Z87891 Personal history of nicotine dependence: Secondary | ICD-10-CM

## 2016-07-16 DIAGNOSIS — Z923 Personal history of irradiation: Secondary | ICD-10-CM

## 2016-07-16 DIAGNOSIS — M25511 Pain in right shoulder: Secondary | ICD-10-CM

## 2016-07-16 DIAGNOSIS — R600 Localized edema: Secondary | ICD-10-CM

## 2016-07-16 DIAGNOSIS — R05 Cough: Secondary | ICD-10-CM

## 2016-07-16 DIAGNOSIS — Z79899 Other long term (current) drug therapy: Secondary | ICD-10-CM

## 2016-07-16 DIAGNOSIS — K1230 Oral mucositis (ulcerative), unspecified: Secondary | ICD-10-CM

## 2016-07-16 DIAGNOSIS — C7970 Secondary malignant neoplasm of unspecified adrenal gland: Secondary | ICD-10-CM

## 2016-07-16 DIAGNOSIS — K1379 Other lesions of oral mucosa: Secondary | ICD-10-CM

## 2016-07-16 DIAGNOSIS — C7900 Secondary malignant neoplasm of unspecified kidney and renal pelvis: Secondary | ICD-10-CM | POA: Diagnosis not present

## 2016-07-16 LAB — COMPREHENSIVE METABOLIC PANEL
ALBUMIN: 3.7 g/dL (ref 3.5–5.0)
ALK PHOS: 74 U/L (ref 38–126)
ALT: 20 U/L (ref 14–54)
ANION GAP: 8 (ref 5–15)
AST: 27 U/L (ref 15–41)
BUN: 12 mg/dL (ref 6–20)
CALCIUM: 8.6 mg/dL — AB (ref 8.9–10.3)
CHLORIDE: 102 mmol/L (ref 101–111)
CO2: 23 mmol/L (ref 22–32)
CREATININE: 0.79 mg/dL (ref 0.44–1.00)
GFR calc non Af Amer: >60 mL/min (ref >60)
GLUCOSE: 171 mg/dL — AB (ref 65–99)
Potassium: 3.6 mmol/L (ref 3.5–5.1)
SODIUM: 133 mmol/L — AB (ref 135–145)
Total Bilirubin: 0.5 mg/dL (ref 0.3–1.2)
Total Protein: 7 g/dL (ref 6.5–8.1)

## 2016-07-16 LAB — CBC WITH DIFFERENTIAL/PLATELET
BASOS PCT: 0 %
Basophils Absolute: 0 10*3/uL (ref 0–0.1)
EOS ABS: 0.1 10*3/uL (ref 0–0.7)
EOS PCT: 0 %
HCT: 24.4 % — ABNORMAL LOW (ref 35.0–47.0)
HEMOGLOBIN: 8.2 g/dL — AB (ref 12.0–16.0)
Lymphocytes Relative: 9 %
Lymphs Abs: 1.1 10*3/uL (ref 1.0–3.6)
MCH: 32.4 pg (ref 26.0–34.0)
MCHC: 33.7 g/dL (ref 32.0–36.0)
MCV: 96.2 fL (ref 80.0–100.0)
Monocytes Absolute: 1.6 10*3/uL — ABNORMAL HIGH (ref 0.2–0.9)
Monocytes Relative: 13 %
NEUTROS PCT: 78 %
Neutro Abs: 10 10*3/uL — ABNORMAL HIGH (ref 1.4–6.5)
PLATELETS: 144 10*3/uL — AB (ref 150–440)
RBC: 2.53 MIL/uL — AB (ref 3.80–5.20)
RDW: 15.2 % — ABNORMAL HIGH (ref 11.5–14.5)
WBC: 12.8 10*3/uL — AB (ref 3.6–11.0)

## 2016-07-16 MED ORDER — HEPARIN SOD (PORK) LOCK FLUSH 100 UNIT/ML IV SOLN
500.0000 [IU] | Freq: Once | INTRAVENOUS | Status: AC | PRN
  Administered 2016-07-16: 500 [IU]

## 2016-07-16 MED ORDER — SODIUM CHLORIDE 0.9 % IJ SOLN
10.0000 mL | INTRAMUSCULAR | Status: DC | PRN
  Administered 2016-07-16: 10 mL
  Filled 2016-07-16: qty 10

## 2016-07-16 MED ORDER — MAGIC MOUTHWASH W/LIDOCAINE: 5.0000 mL | Freq: Four times a day (QID) | ORAL | 0 refills | Status: AC

## 2016-07-16 MED ORDER — HEPARIN SOD (PORK) LOCK FLUSH 100 UNIT/ML IV SOLN
INTRAVENOUS | Status: AC
  Filled 2016-07-16: qty 5

## 2016-07-16 NOTE — Progress Notes (Signed)
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 Diagnosis in January of 2017. Dx-  right supraclavicular lymph node biopsy.  # T2 N3 M1 stage IV disease with metastases to the brain, kidney, and adrenal gland.  # S/p WBRT radiation therapy. (Feb of 2017;]  Startingchemotherapy December 26, 2015 with carboplatinum and VP-16.  # July 12th 2017- Recurrence/ progression- on CT- Carbo-Etopside    # MOLECULAR TESTING- July 13th-[facility]- STABLE; [facility]- NOT enough tissue      Cancer of lower lobe of left lung ([place])   12/05/2015 Initial Diagnosis    Cancer of lower lobe of left lung ([place])       INTERVAL HISTORY:  Cynthia Carson 66 y.o.  female pleasant patient above history of recurrent extensive stage small cell lung cancer is here for follow-up. Patient is status post cycle #2 three weeks  Ago.  Patient complains of difficulty swallowing over the last few days. She also complains of pain in the right of the neck/right shoulder pain.  Cough slightly improved. Shortness of breath no worse. No hemoptysis. She has gained weight. Denies any chest pain. No nausea vomiting with the chemotherapy.  REVIEW OF SYSTEMS:  A complete 10 point review of system is done which is negative except mentioned above/history of present illness.   PAST MEDICAL HISTORY :  Past Medical History:  Diagnosis Date  . Cancer of lung ([place]) 12/05/2015  . Diabetes mellitus without complication ([place])     PAST SURGICAL HISTORY :   Past Surgical History:  Procedure Laterality Date  . ABDOMINAL HYSTERECTOMY    .  PERIPHERAL VASCULAR CATHETERIZATION N/A 01/13/2016   Procedure: Glori Luis Cath Insertion;  Surgeon: Katha Cabal, MD;  Location: [place] [facility];  Service: Cardiovascular;  Laterality: N/A;    FAMILY HISTORY :  No family history on file.  SOCIAL HISTORY:   Social History  Substance Use Topics  . Smoking status: Former Smoker    Packs/day: 1.00    Years: 49.00    Types: Cigarettes    Quit date: 11/19/2011  . Smokeless tobacco: Not on file  . Alcohol use No    ALLERGIES:  is allergic to amoxicillin and levofloxacin.  MEDICATIONS:  Current Outpatient Prescriptions  Medication Sig Dispense Refill  . ALPRAZolam (XANAX) 0.5 MG tablet Take 0.5 mg by mouth 3 (three) times daily as needed for anxiety.     . benzonatate (TESSALON) 200 MG capsule Take 1 capsule (200 mg total) by mouth 3 (three) times daily as needed for cough. 20 capsule 0  . Calcium Carbonate-Vitamin D (CALCIUM-VITAMIN D) 500-200 MG-UNIT tablet Take 1 tablet by mouth daily.    Marland Kitchen glimepiride (AMARYL) 2 MG tablet Take 2 mg by mouth daily with breakfast.     . latanoprost (XALATAN) 0.005 % ophthalmic solution Place 1 drop into both eyes at bedtime.    . lidocaine-prilocaine (EMLA) cream Apply 1 application topically as needed. 30 g 3  . Omega-3 Fatty Acids (FISH OIL) 1000 MG CAPS Take 2,000 mg by mouth 2 (two) times daily.     Marland Kitchen omeprazole (  PRILOSEC) 20 MG capsule Take 20 mg by mouth daily.     . prochlorperazine (COMPAZINE) 10 MG tablet Take 1 tablet (10 mg total) by mouth every 6 (six) hours as needed for nausea or vomiting. 30 tablet 3  . sitaGLIPtin (JANUVIA) 100 MG tablet Take 100 mg by mouth daily.    Marland Kitchen KLOR-CON M20 20 MEQ tablet      No current facility-administered medications for this visit.    Facility-Administered Medications Ordered in Other Visits  Medication Dose Route Frequency Provider Last Rate Last Dose  . alteplase (CATHFLO ACTIVASE) injection 2 mg  2 mg Intracatheter Once PRN Forest Gleason, MD       . heparin lock flush 100 unit/mL  500 Units Intravenous Once Forest Gleason, MD      . heparin lock flush 100 unit/mL  500 Units Intracatheter Once PRN Forest Gleason, MD      . heparin lock flush 100 unit/mL  250 Units Intracatheter Once PRN Forest Gleason, MD      . sodium chloride 0.9 % injection 10 mL  10 mL Intracatheter PRN Forest Gleason, MD      . sodium chloride flush (NS) 0.9 % injection 10 mL  10 mL Intravenous PRN Forest Gleason, MD      . sodium chloride flush (NS) 0.9 % injection 10 mL  10 mL Intravenous PRN Forest Gleason, MD   10 mL at 02/01/16 1439    PHYSICAL EXAMINATION: ECOG PERFORMANCE STATUS: 1 - Symptomatic but completely ambulatory  BP 119/75 (BP Location: Left Arm, Patient Position: Sitting)   Pulse (!) 103   Temp 98.1 F (36.7 C) (Tympanic)   Resp 18   Wt 132 lb 5 oz (60 kg)   BMI 22.02 kg/m   Filed Weights   07/16/16 0940  Weight: 132 lb 5 oz (60 kg)    GENERAL: Thin built well-developed; Alert, no distress and comfortable. She is alone. EYES: no pallor or icterus OROPHARYNX: no thrush or ulceration; good dentition  NECK: supple, no masses felt; tenderness on the right side of neck.  LYMPH:  Right supraclavicular adenopathy improved. ; No axillary or inguinal regions LUNGS: Decreased results of the left lower base No wheeze or crackles HEART/CVS: regular rate & rhythm and no murmurs; No lower extremity edema ABDOMEN:abdomen soft, non-tender and normal bowel sounds Musculoskeletal:no cyanosis of digits and no clubbing  PSYCH: alert & oriented x 3 with fluent speech NEURO: no focal motor/sensory deficits SKIN:  no rashes or significant lesions  LABORATORY DATA:  I have reviewed the data as listed    Component Value Date/Time   NA 133 (L) 07/16/2016 0838   K 3.6 07/16/2016 0838   CL 102 07/16/2016 0838   CO2 23 07/16/2016 0838   GLUCOSE 171 (H) 07/16/2016 0838   BUN 12 07/16/2016 0838   CREATININE 0.79 07/16/2016 0838   CALCIUM 8.6 (L) 07/16/2016 0838    PROT 7.0 07/16/2016 0838   ALBUMIN 3.7 07/16/2016 0838   AST 27 07/16/2016 0838   ALT 20 07/16/2016 0838   ALKPHOS 74 07/16/2016 0838   BILITOT 0.5 07/16/2016 0838   GFRNONAA >60 07/16/2016 0838   GFRAA >60 07/16/2016 0838    No results found for: SPEP, UPEP  Lab Results  Component Value Date   WBC 12.8 (H) 07/16/2016   NEUTROABS 10.0 (H) 07/16/2016   HGB 8.2 (L) 07/16/2016   HCT 24.4 (L) 07/16/2016   MCV 96.2 07/16/2016   PLT 144 (L) 07/16/2016  Chemistry      Component Value Date/Time   NA 133 (L) 07/16/2016 0838   K 3.6 07/16/2016 0838   CL 102 07/16/2016 0838   CO2 23 07/16/2016 0838   BUN 12 07/16/2016 0838   CREATININE 0.79 07/16/2016 0838      Component Value Date/Time   CALCIUM 8.6 (L) 07/16/2016 0838   ALKPHOS 74 07/16/2016 0838   AST 27 07/16/2016 0838   ALT 20 07/16/2016 0838   BILITOT 0.5 07/16/2016 2094       RADIOGRAPHIC STUDIES: I have personally reviewed the radiological images as listed and agreed with the findings in the report. No results found.   ASSESSMENT & PLAN:  Cancer of lower lobe of left lung (HCC) Recurrent small cell lung cancer- Status post cycle #2 carboplatin etoposide- 3 weeks ago clinical response noted with improvement of the neck adenopathy.  # HOLD cycle # 3 today. Sec to reasons below  # Right neck pain/arm swelling-? DVT. Stat UE [place].   #  Difficulty swallowing- [facility] magic mouth wash; recommend continued salt/baking soda rinses.   # History of brain metastases-MRI July 14-shows treated brain metastases no new lesions.  # Follow-up to be determined based upon above workup.   We'll plan CT after 3 cycles. Orders Placed This Encounter  Procedures  . US Venous Img Upper Uni Right    Standing Status:   Future    Standing Expiration Date:   07/16/2017    Order Specific Question:   Reason for Exam (SYMPTOM  OR DIAGNOSIS REQUIRED)    Answer:   right upper extremity (arm) edema    Order Specific Question:   Preferred  imaging location?    Answer:   [facility]    Order Specific Question:   Call Results- Best Contact Number?    Answer:   Call report to Dr. Gildardo Griffes [phone] or Renita Papa, RN at x [phone].  please have pt come back to cancer ctr for results   All questions were answered. The patient knows to call the clinic with any problems, questions or concerns.      Cammie Sickle, MD 07/16/2016 10:15 AM

## 2016-07-16 NOTE — Progress Notes (Signed)
Patient states she developed a sore throat on the right side on Saturday.  It has progressively gotten worse.  States this morning she can hardly swallow water or her own saliva.

## 2016-07-16 NOTE — Addendum Note (Signed)
Addended by: Renita Papa R on: 07/16/2016 11:10 AM   Modules accepted: Orders

## 2016-07-16 NOTE — Assessment & Plan Note (Signed)
Recurrent small cell lung cancer- Status post cycle #2 carboplatin etoposide- 3 weeks ago clinical response noted with improvement of the neck adenopathy.  # HOLD cycle # 3 today. Sec to reasons below  # Right neck pain/arm swelling-? DVT. Stat UE [place].   #  Difficulty swallowing- [facility] magic mouth wash; recommend continued salt/baking soda rinses.   # History of brain metastases-MRI July 14-shows treated brain metastases no new lesions.  # Follow-up to be determined based upon above workup.

## 2016-07-17 ENCOUNTER — Inpatient Hospital Stay: Payer: BLUE CROSS/BLUE SHIELD

## 2016-07-18 ENCOUNTER — Inpatient Hospital Stay: Payer: BLUE CROSS/BLUE SHIELD

## 2016-07-20 ENCOUNTER — Other Ambulatory Visit: Payer: Self-pay | Admitting: Oncology

## 2016-07-20 NOTE — Telephone Encounter (Signed)
Ref Range & Units 4d ago 2wk ago     Sodium 135 - 145 mmol/L 133  138   Potassium 3.5 - 5.1 mmol/L 3.6 3.3

## 2016-07-24 ENCOUNTER — Inpatient Hospital Stay: Payer: BLUE CROSS/BLUE SHIELD

## 2016-07-24 ENCOUNTER — Inpatient Hospital Stay: Payer: BLUE CROSS/BLUE SHIELD | Attending: Internal Medicine

## 2016-07-24 ENCOUNTER — Other Ambulatory Visit: Payer: Self-pay | Admitting: Internal Medicine

## 2016-07-24 VITALS — BP 107/72 | HR 100 | Resp 20

## 2016-07-24 DIAGNOSIS — Z87891 Personal history of nicotine dependence: Secondary | ICD-10-CM | POA: Insufficient documentation

## 2016-07-24 DIAGNOSIS — D696 Thrombocytopenia, unspecified: Secondary | ICD-10-CM | POA: Diagnosis not present

## 2016-07-24 DIAGNOSIS — R1310 Dysphagia, unspecified: Secondary | ICD-10-CM | POA: Insufficient documentation

## 2016-07-24 DIAGNOSIS — Z5111 Encounter for antineoplastic chemotherapy: Secondary | ICD-10-CM | POA: Insufficient documentation

## 2016-07-24 DIAGNOSIS — Z7689 Persons encountering health services in other specified circumstances: Secondary | ICD-10-CM | POA: Insufficient documentation

## 2016-07-24 DIAGNOSIS — C7931 Secondary malignant neoplasm of brain: Secondary | ICD-10-CM | POA: Diagnosis not present

## 2016-07-24 DIAGNOSIS — C3432 Malignant neoplasm of lower lobe, left bronchus or lung: Secondary | ICD-10-CM | POA: Insufficient documentation

## 2016-07-24 DIAGNOSIS — E119 Type 2 diabetes mellitus without complications: Secondary | ICD-10-CM | POA: Insufficient documentation

## 2016-07-24 DIAGNOSIS — C7900 Secondary malignant neoplasm of unspecified kidney and renal pelvis: Secondary | ICD-10-CM | POA: Diagnosis not present

## 2016-07-24 DIAGNOSIS — E278 Other specified disorders of adrenal gland: Secondary | ICD-10-CM | POA: Diagnosis not present

## 2016-07-24 DIAGNOSIS — R590 Localized enlarged lymph nodes: Secondary | ICD-10-CM

## 2016-07-24 LAB — COMPREHENSIVE METABOLIC PANEL
ALBUMIN: 3.8 g/dL (ref 3.5–5.0)
ALT: 11 U/L — ABNORMAL LOW (ref 14–54)
ANION GAP: 8 (ref 5–15)
AST: 14 U/L — ABNORMAL LOW (ref 15–41)
Alkaline Phosphatase: 66 U/L (ref 38–126)
BILIRUBIN TOTAL: 0.4 mg/dL (ref 0.3–1.2)
BUN: 24 mg/dL — ABNORMAL HIGH (ref 6–20)
CHLORIDE: 101 mmol/L (ref 101–111)
CO2: 25 mmol/L (ref 22–32)
Calcium: 9 mg/dL (ref 8.9–10.3)
Creatinine, Ser: 0.78 mg/dL (ref 0.44–1.00)
GFR calc Af Amer: >60 mL/min (ref >60)
GLUCOSE: 149 mg/dL — AB (ref 65–99)
POTASSIUM: 3.4 mmol/L — AB (ref 3.5–5.1)
Sodium: 134 mmol/L — ABNORMAL LOW (ref 135–145)
TOTAL PROTEIN: 7.2 g/dL (ref 6.5–8.1)

## 2016-07-24 LAB — CBC WITH DIFFERENTIAL/PLATELET
BASOS ABS: 0.1 10*3/uL (ref 0–0.1)
Basophils Relative: 1 %
Eosinophils Absolute: 0 10*3/uL (ref 0–0.7)
Eosinophils Relative: 0 %
HEMATOCRIT: 25.6 % — AB (ref 35.0–47.0)
Hemoglobin: 8.7 g/dL — ABNORMAL LOW (ref 12.0–16.0)
LYMPHS ABS: 2 10*3/uL (ref 1.0–3.6)
Lymphocytes Relative: 14 %
MCH: 32.7 pg (ref 26.0–34.0)
MCHC: 33.9 g/dL (ref 32.0–36.0)
MCV: 96.5 fL (ref 80.0–100.0)
MONO ABS: 1.3 10*3/uL — AB (ref 0.2–0.9)
MONOS PCT: 9 %
NEUTROS ABS: 10.7 10*3/uL — AB (ref 1.4–6.5)
Neutrophils Relative %: 76 %
PLATELETS: 353 10*3/uL (ref 150–440)
RBC: 2.65 MIL/uL — ABNORMAL LOW (ref 3.80–5.20)
RDW: 18.7 % — AB (ref 11.5–14.5)
WBC: 14.1 10*3/uL — AB (ref 3.6–11.0)

## 2016-07-24 MED ORDER — PALONOSETRON HCL INJECTION 0.25 MG/5ML
0.2500 mg | Freq: Once | INTRAVENOUS | Status: AC
  Administered 2016-07-24: 0.25 mg via INTRAVENOUS
  Filled 2016-07-24: qty 5

## 2016-07-24 MED ORDER — SODIUM CHLORIDE 0.9 % IV SOLN
Freq: Once | INTRAVENOUS | Status: AC
  Administered 2016-07-24: 11:00:00 via INTRAVENOUS
  Filled 2016-07-24: qty 1000

## 2016-07-24 MED ORDER — SODIUM CHLORIDE 0.9 % IV SOLN
10.0000 mg | Freq: Once | INTRAVENOUS | Status: AC
  Administered 2016-07-24: 10 mg via INTRAVENOUS
  Filled 2016-07-24: qty 1

## 2016-07-24 MED ORDER — HEPARIN SOD (PORK) LOCK FLUSH 100 UNIT/ML IV SOLN
500.0000 [IU] | Freq: Once | INTRAVENOUS | Status: AC | PRN
  Administered 2016-07-24: 500 [IU]
  Filled 2016-07-24: qty 5

## 2016-07-24 MED ORDER — SODIUM CHLORIDE 0.9 % IV SOLN
140.0000 mg | Freq: Once | INTRAVENOUS | Status: AC
  Administered 2016-07-24: 140 mg via INTRAVENOUS
  Filled 2016-07-24: qty 7

## 2016-07-24 MED ORDER — SODIUM CHLORIDE 0.9 % IV SOLN
453.5000 mg | Freq: Once | INTRAVENOUS | Status: AC
  Administered 2016-07-24: 450 mg via INTRAVENOUS
  Filled 2016-07-24: qty 45

## 2016-07-25 ENCOUNTER — Inpatient Hospital Stay: Payer: BLUE CROSS/BLUE SHIELD

## 2016-07-25 VITALS — BP 110/71 | HR 84 | Temp 97.8°F | Resp 18

## 2016-07-25 DIAGNOSIS — R590 Localized enlarged lymph nodes: Secondary | ICD-10-CM

## 2016-07-25 DIAGNOSIS — C3432 Malignant neoplasm of lower lobe, left bronchus or lung: Secondary | ICD-10-CM | POA: Diagnosis not present

## 2016-07-25 MED ORDER — SODIUM CHLORIDE 0.9 % IV SOLN
140.0000 mg | Freq: Once | INTRAVENOUS | Status: AC
  Administered 2016-07-25: 140 mg via INTRAVENOUS
  Filled 2016-07-25: qty 7

## 2016-07-25 MED ORDER — SODIUM CHLORIDE 0.9 % IV SOLN
Freq: Once | INTRAVENOUS | Status: AC
  Administered 2016-07-25: 09:00:00 via INTRAVENOUS
  Filled 2016-07-25: qty 1000

## 2016-07-25 MED ORDER — SODIUM CHLORIDE 0.9 % IV SOLN
10.0000 mg | Freq: Once | INTRAVENOUS | Status: AC
  Administered 2016-07-25: 10 mg via INTRAVENOUS
  Filled 2016-07-25: qty 1

## 2016-07-25 MED ORDER — HEPARIN SOD (PORK) LOCK FLUSH 100 UNIT/ML IV SOLN
INTRAVENOUS | Status: AC
  Filled 2016-07-25: qty 5

## 2016-07-25 MED ORDER — SODIUM CHLORIDE 0.9% FLUSH
10.0000 mL | INTRAVENOUS | Status: DC | PRN
  Administered 2016-07-25: 10 mL
  Filled 2016-07-25: qty 10

## 2016-07-25 MED ORDER — HEPARIN SOD (PORK) LOCK FLUSH 100 UNIT/ML IV SOLN
500.0000 [IU] | Freq: Once | INTRAVENOUS | Status: AC | PRN
  Administered 2016-07-25: 500 [IU]

## 2016-07-26 ENCOUNTER — Inpatient Hospital Stay: Payer: BLUE CROSS/BLUE SHIELD

## 2016-07-26 VITALS — BP 95/63 | HR 87 | Temp 98.8°F | Resp 20

## 2016-07-26 DIAGNOSIS — C3432 Malignant neoplasm of lower lobe, left bronchus or lung: Secondary | ICD-10-CM

## 2016-07-26 DIAGNOSIS — R590 Localized enlarged lymph nodes: Secondary | ICD-10-CM

## 2016-07-26 MED ORDER — PEGFILGRASTIM 6 MG/0.6ML ~~LOC~~ PSKT
6.0000 mg | PREFILLED_SYRINGE | Freq: Once | SUBCUTANEOUS | Status: AC
  Administered 2016-07-26: 6 mg via SUBCUTANEOUS
  Filled 2016-07-26: qty 0.6

## 2016-07-26 MED ORDER — SODIUM CHLORIDE 0.9 % IV SOLN
10.0000 mg | Freq: Once | INTRAVENOUS | Status: AC
  Administered 2016-07-26: 10 mg via INTRAVENOUS
  Filled 2016-07-26: qty 1

## 2016-07-26 MED ORDER — SODIUM CHLORIDE 0.9 % IV SOLN
Freq: Once | INTRAVENOUS | Status: AC
  Administered 2016-07-26: 10:00:00 via INTRAVENOUS
  Filled 2016-07-26: qty 1000

## 2016-07-26 MED ORDER — HEPARIN SOD (PORK) LOCK FLUSH 100 UNIT/ML IV SOLN
500.0000 [IU] | Freq: Once | INTRAVENOUS | Status: AC | PRN
  Administered 2016-07-26: 500 [IU]
  Filled 2016-07-26: qty 5

## 2016-07-26 MED ORDER — SODIUM CHLORIDE 0.9% FLUSH
10.0000 mL | INTRAVENOUS | Status: DC | PRN
  Administered 2016-07-26: 10 mL
  Filled 2016-07-26: qty 10

## 2016-07-26 MED ORDER — SODIUM CHLORIDE 0.9 % IV SOLN
140.0000 mg | Freq: Once | INTRAVENOUS | Status: AC
  Administered 2016-07-26: 140 mg via INTRAVENOUS
  Filled 2016-07-26: qty 7

## 2016-07-30 ENCOUNTER — Other Ambulatory Visit: Payer: Self-pay | Admitting: *Deleted

## 2016-07-30 DIAGNOSIS — R05 Cough: Secondary | ICD-10-CM

## 2016-07-30 DIAGNOSIS — R059 Cough, unspecified: Secondary | ICD-10-CM

## 2016-07-30 MED ORDER — BENZONATATE 200 MG PO CAPS: 200.0000 mg | ORAL_CAPSULE | Freq: Three times a day (TID) | ORAL | 0 refills | Status: DC | PRN

## 2016-08-06 ENCOUNTER — Inpatient Hospital Stay: Payer: BLUE CROSS/BLUE SHIELD

## 2016-08-06 ENCOUNTER — Inpatient Hospital Stay (HOSPITAL_BASED_OUTPATIENT_CLINIC_OR_DEPARTMENT_OTHER): Payer: BLUE CROSS/BLUE SHIELD | Admitting: Internal Medicine

## 2016-08-06 ENCOUNTER — Encounter: Payer: Self-pay | Admitting: *Deleted

## 2016-08-06 VITALS — BP 115/82 | HR 87 | Temp 97.1°F | Wt 132.4 lb

## 2016-08-06 DIAGNOSIS — D696 Thrombocytopenia, unspecified: Secondary | ICD-10-CM

## 2016-08-06 DIAGNOSIS — E119 Type 2 diabetes mellitus without complications: Secondary | ICD-10-CM

## 2016-08-06 DIAGNOSIS — Z7689 Persons encountering health services in other specified circumstances: Secondary | ICD-10-CM | POA: Diagnosis not present

## 2016-08-06 DIAGNOSIS — C7931 Secondary malignant neoplasm of brain: Secondary | ICD-10-CM | POA: Diagnosis not present

## 2016-08-06 DIAGNOSIS — C7900 Secondary malignant neoplasm of unspecified kidney and renal pelvis: Secondary | ICD-10-CM

## 2016-08-06 DIAGNOSIS — E278 Other specified disorders of adrenal gland: Secondary | ICD-10-CM

## 2016-08-06 DIAGNOSIS — R1310 Dysphagia, unspecified: Secondary | ICD-10-CM

## 2016-08-06 DIAGNOSIS — C3432 Malignant neoplasm of lower lobe, left bronchus or lung: Secondary | ICD-10-CM

## 2016-08-06 DIAGNOSIS — Z87891 Personal history of nicotine dependence: Secondary | ICD-10-CM

## 2016-08-06 LAB — COMPREHENSIVE METABOLIC PANEL
ALT: 16 U/L (ref 14–54)
AST: 19 U/L (ref 15–41)
Albumin: 3.8 g/dL (ref 3.5–5.0)
Alkaline Phosphatase: 92 U/L (ref 38–126)
Anion gap: 9 (ref 5–15)
BUN: 17 mg/dL (ref 6–20)
CHLORIDE: 104 mmol/L (ref 101–111)
CO2: 25 mmol/L (ref 22–32)
CREATININE: 0.85 mg/dL (ref 0.44–1.00)
Calcium: 8.7 mg/dL — ABNORMAL LOW (ref 8.9–10.3)
GFR calc non Af Amer: >60 mL/min (ref >60)
Glucose, Bld: 155 mg/dL — ABNORMAL HIGH (ref 65–99)
POTASSIUM: 3.9 mmol/L (ref 3.5–5.1)
SODIUM: 138 mmol/L (ref 135–145)
Total Bilirubin: 0.4 mg/dL (ref 0.3–1.2)
Total Protein: 7 g/dL (ref 6.5–8.1)

## 2016-08-06 LAB — CBC WITH DIFFERENTIAL/PLATELET
BASOS ABS: 0.1 10*3/uL (ref 0–0.1)
Basophils Relative: 1 %
EOS ABS: 0.1 10*3/uL (ref 0–0.7)
HCT: 22.2 % — ABNORMAL LOW (ref 35.0–47.0)
Hemoglobin: 7.6 g/dL — ABNORMAL LOW (ref 12.0–16.0)
Lymphocytes Relative: 22 %
Lymphs Abs: 1.4 10*3/uL (ref 1.0–3.6)
MCH: 34.1 pg — AB (ref 26.0–34.0)
MCHC: 34.2 g/dL (ref 32.0–36.0)
MCV: 99.6 fL (ref 80.0–100.0)
MONO ABS: 0.6 10*3/uL (ref 0.2–0.9)
Monocytes Relative: 10 %
Neutro Abs: 4.2 10*3/uL (ref 1.4–6.5)
Neutrophils Relative %: 65 %
PLATELETS: 41 10*3/uL — AB (ref 150–440)
RBC: 2.23 MIL/uL — AB (ref 3.80–5.20)
RDW: 19.1 % — AB (ref 11.5–14.5)
WBC: 6.4 10*3/uL (ref 3.6–11.0)

## 2016-08-06 NOTE — Progress Notes (Signed)
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 Diagnosis in January of 2017. Dx-  right supraclavicular lymph node biopsy.  # T2 N3 M1 stage IV disease with metastases to the brain, kidney, and adrenal gland.  # S/p WBRT radiation therapy. (Feb of 2017;]  Startingchemotherapy December 26, 2015 with carboplatinum and VP-16.  # July 12th 2017- Recurrence/ progression- on CT- Carbo-[place]    # MOLECULAR TESTING- July 13th-[facility]- STABLE; [facility]- NOT enough tissue      Cancer of lower lobe of left lung ([place])   12/05/2015 Initial Diagnosis    Cancer of lower lobe of left lung ([place])       INTERVAL HISTORY:  Cynthia Carson 65 y.o.  female pleasant patient above history of recurrent extensive stage small cell lung cancer is here for follow-up. Patient is status post cycle #3 appx 2 weeks ago.    the difficulty swallowing has improved. Right neck mass significantly improved. Denies any bleeding.  Cough slightly improved. Shortness of breath no worse. No hemoptysis. She has gained weight. Denies any chest pain. No nausea vomiting with the chemotherapy.  REVIEW OF SYSTEMS:  A complete 10 point review of system is done which is negative except mentioned above/history of present illness.   PAST MEDICAL HISTORY :  Past Medical History:  Diagnosis Date  . Cancer of lung ([place]) 12/05/2015  . Diabetes mellitus without complication ([place])     PAST SURGICAL HISTORY :   Past Surgical History:  Procedure Laterality Date  . ABDOMINAL HYSTERECTOMY    . PERIPHERAL VASCULAR CATHETERIZATION N/A  01/13/2016   Procedure: Glori Luis Cath Insertion;  Surgeon: Katha Cabal, MD;  Location: [facility];  Service: Cardiovascular;  Laterality: N/A;    FAMILY HISTORY :  No family history on file.  SOCIAL HISTORY:   Social History  Substance Use Topics  . Smoking status: Former Smoker    Packs/day: 1.00    Years: 49.00    Types: Cigarettes    Quit date: 11/19/2011  . Smokeless tobacco: Not on file  . Alcohol use No    ALLERGIES:  is allergic to amoxicillin and levofloxacin.  MEDICATIONS:  Current Outpatient Prescriptions  Medication Sig Dispense Refill  . ALPRAZolam (XANAX) 0.5 MG tablet Take 0.5 mg by mouth 3 (three) times daily as needed for anxiety.     . benzonatate (TESSALON) 200 MG capsule Take 1 capsule (200 mg total) by mouth 3 (three) times daily as needed for cough. 20 capsule 0  . Calcium Carbonate-Vitamin D (CALCIUM-VITAMIN D) 500-200 MG-UNIT tablet Take 1 tablet by mouth daily.    Marland Kitchen glimepiride (AMARYL) 2 MG tablet Take 2 mg by mouth daily with breakfast.     . KLOR-CON M20 20 MEQ tablet     . KLOR-CON M20 20 MEQ tablet TAKE ONE TABLET BY MOUTH DAILY 30 tablet 3  . latanoprost (XALATAN) 0.005 % ophthalmic solution Place 1 drop into both eyes at bedtime.    . lidocaine-prilocaine (EMLA) cream Apply 1 application topically as needed. 30 g 3  . magic mouthwash w/lidocaine SOLN Take  5 mLs by mouth 4 (four) times daily. 80 ml viscous lidocaine 2%, 80 ml Mylanta, 80 ml Diphenhydramine 12.5 mg/5 ml Elixir, 80 ml Nystatin 100,000 Unit suspension, 80 ml Prednisolone 15 mg/36m, 80 ml Distilled Water. Sig: Swish/Swallow 5-10 ml four times a day as needed. Dispense 480 ml. 3RFs 480 mL 0  . Omega-3 Fatty Acids (FISH OIL) 1000 MG CAPS Take 2,000 mg by mouth 2 (two) times daily.     .Marland Kitchenomeprazole (PRILOSEC) 20 MG capsule Take 20 mg by mouth daily.     . prochlorperazine (COMPAZINE) 10 MG tablet Take 1 tablet (10 mg total) by mouth every 6 (six) hours as needed for nausea or  vomiting. 30 tablet 3  . sitaGLIPtin (JANUVIA) 100 MG tablet Take 100 mg by mouth daily.    .Marland Kitchenlidocaine (XYLOCAINE) 2 % solution      No current facility-administered medications for this visit.    Facility-Administered Medications Ordered in Other Visits  Medication Dose Route Frequency Provider Last Rate Last Dose  . alteplase (CATHFLO ACTIVASE) injection 2 mg  2 mg Intracatheter Once PRN JForest Gleason MD      . heparin lock flush 100 unit/mL  500 Units Intravenous Once JForest Gleason MD      . heparin lock flush 100 unit/mL  500 Units Intracatheter Once PRN JForest Gleason MD      . heparin lock flush 100 unit/mL  250 Units Intracatheter Once PRN JForest Gleason MD      . sodium chloride 0.9 % injection 10 mL  10 mL Intracatheter PRN JForest Gleason MD      . sodium chloride flush (NS) 0.9 % injection 10 mL  10 mL Intravenous PRN JForest Gleason MD      . sodium chloride flush (NS) 0.9 % injection 10 mL  10 mL Intravenous PRN JForest Gleason MD   10 mL at 02/01/16 1439    PHYSICAL EXAMINATION: ECOG PERFORMANCE STATUS: 1 - Symptomatic but completely ambulatory  BP 115/82 (BP Location: Left Arm, Patient Position: Sitting)   Pulse 87   Temp 97.1 F (36.2 C) (Tympanic)   Wt 132 lb 6.4 oz (60.1 kg)   BMI 22.03 kg/m   Filed Weights   08/06/16 1216  Weight: 132 lb 6.4 oz (60.1 kg)    GENERAL: Thin built well-developed; Alert, no distress and comfortable. She is Accompanied by her husband. EYES: no pallor or icterus OROPHARYNX: no thrush or ulceration; good dentition  NECK: supple, no masses felt; tenderness on the right side of neck.  LYMPH:  Right supraclavicular adenopathy improved. ; No axillary or inguinal regions LUNGS: Decreased results of the left lower base No wheeze or crackles HEART/CVS: regular rate & rhythm and no murmurs; No lower extremity edema ABDOMEN:abdomen soft, non-tender and normal bowel sounds Musculoskeletal:no cyanosis of digits and no clubbing  PSYCH: alert &  oriented x 3 with fluent speech NEURO: no focal motor/sensory deficits SKIN:  no rashes or significant lesions  LABORATORY DATA:  I have reviewed the data as listed    Component Value Date/Time   NA 138 08/06/2016 1121   K 3.9 08/06/2016 1121   CL 104 08/06/2016 1121   CO2 25 08/06/2016 1121   GLUCOSE 155 (H) 08/06/2016 1121   BUN 17 08/06/2016 1121   CREATININE 0.85 08/06/2016 1121   CALCIUM 8.7 (L) 08/06/2016 1121   PROT 7.0 08/06/2016 1121   ALBUMIN 3.8 08/06/2016 1121   AST 19 08/06/2016 1121   ALT 16 08/06/2016 1121  ALKPHOS 92 08/06/2016 1121   BILITOT 0.4 08/06/2016 1121   GFRNONAA >60 08/06/2016 1121   GFRAA >60 08/06/2016 1121    No results found for: SPEP, UPEP  Lab Results  Component Value Date   WBC 6.4 08/06/2016   NEUTROABS 4.2 08/06/2016   HGB 7.6 (L) 08/06/2016   HCT 22.2 (L) 08/06/2016   MCV 99.6 08/06/2016   PLT 41 (L) 08/06/2016      Chemistry      Component Value Date/Time   NA 138 08/06/2016 1121   K 3.9 08/06/2016 1121   CL 104 08/06/2016 1121   CO2 25 08/06/2016 1121   BUN 17 08/06/2016 1121   CREATININE 0.85 08/06/2016 1121      Component Value Date/Time   CALCIUM 8.7 (L) 08/06/2016 1121   ALKPHOS 92 08/06/2016 1121   AST 19 08/06/2016 1121   ALT 16 08/06/2016 1121   BILITOT 0.4 08/06/2016 1121       RADIOGRAPHIC STUDIES: I have personally reviewed the radiological images as listed and agreed with the findings in the report. No results found.   ASSESSMENT & PLAN:  Cancer of lower lobe of left lung (HCC) Recurrent small cell lung cancer- Status post cycle #3  carboplatin etoposide- 10 weeks ago clinical response noted with improvement of the neck adenopathy. Will get CT scan after 4 cycles.   # plan cycle #4 on Oct 2nd [push by 1 week] sec to low platelets [platelets 45]/ anemia [hemoglobin 7.8]  #  Difficulty swallowing-Improved.  [facility] magic mouth wash  # Follow-up with me on October 2 CBC CMP; cycle #4 day 1  No orders  of the defined types were placed in this encounter.  All questions were answered. The patient knows to call the clinic with any problems, questions or concerns.      Cammie Sickle, MD 08/06/2016 1:05 PM

## 2016-08-06 NOTE — Assessment & Plan Note (Signed)
Recurrent small cell lung cancer- Status post cycle #3  carboplatin etoposide- 10 weeks ago clinical response noted with improvement of the neck adenopathy. Will get CT scan after 4 cycles.   # plan cycle #4 on Oct 2nd [push by 1 week] sec to low platelets [platelets 45]/ anemia [hemoglobin 7.8]  #  Difficulty swallowing-Improved.  [facility] magic mouth wash  # Follow-up with me on October 2 CBC CMP; cycle #4 day 1

## 2016-08-06 NOTE — Progress Notes (Signed)
Patient ambulates independently without assistance, brought to exam room 19.  Patient denies pain or discomfort at this time.

## 2016-08-14 ENCOUNTER — Encounter: Payer: Self-pay | Admitting: Medical Oncology

## 2016-08-14 ENCOUNTER — Emergency Department
Admission: EM | Admit: 2016-08-14 | Discharge: 2016-08-14 | Disposition: A | Discharge status: Home / Self Care | Payer: BLUE CROSS/BLUE SHIELD | Attending: Emergency Medicine | Admitting: Emergency Medicine

## 2016-08-14 ENCOUNTER — Emergency Department: Payer: BLUE CROSS/BLUE SHIELD

## 2016-08-14 ENCOUNTER — Telehealth: Payer: Self-pay | Admitting: *Deleted

## 2016-08-14 DIAGNOSIS — Z87891 Personal history of nicotine dependence: Secondary | ICD-10-CM | POA: Insufficient documentation

## 2016-08-14 DIAGNOSIS — R1310 Dysphagia, unspecified: Secondary | ICD-10-CM | POA: Diagnosis not present

## 2016-08-14 DIAGNOSIS — E119 Type 2 diabetes mellitus without complications: Secondary | ICD-10-CM | POA: Insufficient documentation

## 2016-08-14 DIAGNOSIS — Z85118 Personal history of other malignant neoplasm of bronchus and lung: Secondary | ICD-10-CM | POA: Insufficient documentation

## 2016-08-14 DIAGNOSIS — Z7984 Long term (current) use of oral hypoglycemic drugs: Secondary | ICD-10-CM | POA: Insufficient documentation

## 2016-08-14 DIAGNOSIS — J449 Chronic obstructive pulmonary disease, unspecified: Secondary | ICD-10-CM | POA: Diagnosis not present

## 2016-08-14 LAB — CBC WITH DIFFERENTIAL/PLATELET
Basophils Absolute: 0 10*3/uL (ref 0–0.1)
Basophils Relative: 0 %
Eosinophils Absolute: 0 10*3/uL (ref 0–0.7)
Eosinophils Relative: 0 %
HEMATOCRIT: 25.8 % — AB (ref 35.0–47.0)
HEMOGLOBIN: 8.5 g/dL — AB (ref 12.0–16.0)
LYMPHS ABS: 0.9 10*3/uL — AB (ref 1.0–3.6)
LYMPHS PCT: 7 %
MCH: 33.8 pg (ref 26.0–34.0)
MCHC: 33.1 g/dL (ref 32.0–36.0)
MCV: 102.2 fL — AB (ref 80.0–100.0)
Monocytes Absolute: 1.2 10*3/uL — ABNORMAL HIGH (ref 0.2–0.9)
Monocytes Relative: 9 %
NEUTROS PCT: 84 %
Neutro Abs: 10.8 10*3/uL — ABNORMAL HIGH (ref 1.4–6.5)
Platelets: 100 10*3/uL — ABNORMAL LOW (ref 150–440)
RBC: 2.52 MIL/uL — AB (ref 3.80–5.20)
RDW: 22.1 % — ABNORMAL HIGH (ref 11.5–14.5)
WBC: 12.9 10*3/uL — AB (ref 3.6–11.0)

## 2016-08-14 LAB — COMPREHENSIVE METABOLIC PANEL
ALT: 14 U/L (ref 14–54)
AST: 17 U/L (ref 15–41)
Albumin: 4 g/dL (ref 3.5–5.0)
Alkaline Phosphatase: 85 U/L (ref 38–126)
Anion gap: 9 (ref 5–15)
BUN: 19 mg/dL (ref 6–20)
CHLORIDE: 100 mmol/L — AB (ref 101–111)
CO2: 26 mmol/L (ref 22–32)
CREATININE: 0.89 mg/dL (ref 0.44–1.00)
Calcium: 9.5 mg/dL (ref 8.9–10.3)
GFR calc Af Amer: >60 mL/min (ref >60)
Glucose, Bld: 163 mg/dL — ABNORMAL HIGH (ref 65–99)
Potassium: 4.1 mmol/L (ref 3.5–5.1)
SODIUM: 135 mmol/L (ref 135–145)
Total Bilirubin: 0.6 mg/dL (ref 0.3–1.2)
Total Protein: 7.5 g/dL (ref 6.5–8.1)

## 2016-08-14 LAB — TSH: TSH: 1.557 u[IU]/mL (ref 0.350–4.500)

## 2016-08-14 MED ORDER — HYDROCOD POLST-CPM POLST ER 10-8 MG/5ML PO SUER: 5.0000 mL | Freq: Every evening | ORAL | 0 refills | Status: AC | PRN

## 2016-08-14 NOTE — Telephone Encounter (Signed)
Called to report that the swelling in her neck is worse and now having difficulty swallowing and is choking on her foods (grits). MMW is not helping. Please advise

## 2016-08-14 NOTE — ED Triage Notes (Signed)
Pt reports that she began Saturday having difficulty swallowing. Pt reports she feels like at times she cant drink water. Pt reports throat feels "raw". Pt also reports cough and swelling to the rt side of her neck.

## 2016-08-14 NOTE — ED Notes (Addendum)
Pt c/o swelling to right side of neck X 1 month ago; prescribed magic mouth wash and cough tablets. Pt states she is having a hard time swallowing since the weekend. Pain with swallowing. Headache to back of right head and neck pain, "I think it's from the coughing". Pt alert and oriented X4, active, cooperative, pt in NAD. RR even and unlabored, color WNL.

## 2016-08-14 NOTE — ED Notes (Signed)
Pt back from x-ray.

## 2016-08-14 NOTE — ED Notes (Signed)
Patient transported to X-ray 

## 2016-08-14 NOTE — ED Provider Notes (Signed)
Time Seen: Approximately 1519  I have reviewed the triage notes  Chief Complaint: Choking (difficulty swallowing)   History of Present Illness: Cynthia Carson is a 65 y.o. female who has a significant history of lung cancer, diabetes. Patient's cancer is in the left lung base and associated with some COPD and metastatic brain disease. Patient's currently undergoing chemotherapy with a right-sided port this been working adequately. She states she had a recent swelling on the right side of her neck and it was an ultrasound performed that did not show a blood clot and she was placed on some general cough medication and Magic mouthwash. She states she still has a cough and states recently she's had some difficulty swallowing. She states her speech is normal and she is able to drink and eat but it feels like something is getting stuck and she points mainly to the upper part of her throat. She states she took a fish oral pill and it was very difficult for her to get the official oral down and had to drink multiple glasses of water fever or productive nature to her cough. The cough is apparently been going on for a significant period of time with no associated hypoxia. She states she still has some occasional swelling on the right side of her neck.   Past Medical History:  Diagnosis Date  . Cancer of lung ([place]) 12/05/2015  . Diabetes mellitus without complication [facility])     Patient Active Problem List   Diagnosis Date Noted  . Brain metastasis ([place]) 05/31/2016  . Cough 05/17/2016  . Cancer of lower lobe of left lung ([place]) 12/05/2015  . Mediastinal adenopathy 11/24/2015  . Supraclavicular adenopathy   . COPD (chronic obstructive pulmonary disease) ([place]) 11/23/2015  . Dysarthria 11/23/2015  . Hypokalemia 11/23/2015    Past Surgical History:  Procedure Laterality Date  . ABDOMINAL HYSTERECTOMY    . PERIPHERAL VASCULAR CATHETERIZATION N/A 01/13/2016   Procedure: Glori Luis Cath Insertion;  Surgeon:  Katha Cabal, MD;  Location: [facility];  Service: Cardiovascular;  Laterality: N/A;    Past Surgical History:  Procedure Laterality Date  . ABDOMINAL HYSTERECTOMY    . PERIPHERAL VASCULAR CATHETERIZATION N/A 01/13/2016   Procedure: Glori Luis Cath Insertion;  Surgeon: Katha Cabal, MD;  Location: [place] [facility];  Service: Cardiovascular;  Laterality: N/A;    Current Outpatient Rx  . Order #: [id number] Class: Historical Med  . Order #: [id number] Class: Normal  . Order #: [id number] Class: Historical Med  . Order #: [id number] Class: Historical Med  . Order #: [id number] Class: Historical Med  . Order #: [id number] Class: Normal  . Order #: [id number] Class: Historical Med  . Order #: [id number] Class: Historical Med  . Order #: [id number] Class: Normal  . Order #: [id number] Class: Print  . Order #: [id number] Class: Historical Med  . Order #: [id number] Class: Historical Med  . Order #: [id number] Class: Normal  . Order #: [id number] Class: Historical Med    Allergies:  Amoxicillin and Levofloxacin  Family History: No family history on file.  Social History: Social History  Substance Use Topics  . Smoking status: Former Smoker    Packs/day: 1.00    Years: 49.00    Types: Cigarettes    Quit date: 11/19/2011  . Smokeless tobacco: Not on file  . Alcohol use No     Review of Systems:   10 point review of systems was performed and was otherwise negative:  Constitutional: No fever Eyes: No visual disturbances ENT: No sore throat, ear pain Cardiac:  No chest pain Respiratory: No shortness of breath, wheezing, or stridor Abdomen: No abdominal pain, no vomiting, No diarrhea Endocrine: No weight loss, No night sweats Extremities: No peripheral edema, cyanosis Skin: No rashes, easy bruising Neurologic: No focal weakness, trouble with speech or swollowing Urologic: No dysuria, Hematuria, or urinary frequency   Physical Exam:  ED Triage Vitals  Enc Vitals Group     BP  08/14/16 1507 137/82     Pulse Rate 08/14/16 1507 (!) 103     Resp 08/14/16 1507 20     Temp 08/14/16 1507 97.6 F (36.4 C)     Temp Source 08/14/16 1507 Oral     SpO2 08/14/16 1507 97 %     Weight 08/14/16 1508 132 lb (59.9 kg)     Height 08/14/16 1508 '5\' 5"'$  (1.651 m)     Head Circumference --      Peak Flow --      Pain Score 08/14/16 1508 10     Pain Loc --      Pain Edu? --      Excl. in [place]? --     General: Awake , Alert , and Oriented times 3; GCS 15 Head: Normal cephalic , atraumatic. Alopecia Eyes: Pupils equal , round, reactive to light Nose/Throat: No nasal drainage, patent upper airway without erythema or exudate. No signs of thrush Neck: Supple, Full range of motion, No anterior adenopathy or palpable thyroid masses. No palpable adenopathy. No stridor on auscultation Lungs: Clear to ascultation without wheezes , rhonchi, or rales Heart: Regular rate, regular rhythm without murmurs , gallops , or rubs Abdomen: Soft, non tender without rebound, guarding , or rigidity; bowel sounds positive and symmetric in all 4 quadrants. No organomegaly .        Extremities: 2 plus symmetric pulses. No edema, clubbing or cyanosis Neurologic: normal ambulation, Motor symmetric without deficits, sensory intact Skin: warm, dry, no rashes Port right-sided no signs of inflammation or swelling  Labs:   All laboratory work was reviewed including any pertinent negatives or positives listed below:  Labs Reviewed  COMPREHENSIVE METABOLIC PANEL  CBC WITH DIFFERENTIAL/PLATELET  TSH  T4  Laboratory work was reviewed and appears to be consistent for the patient given her history of cancer with chemotherapy  Radiology:  "Dg Neck Soft Tissue  Result Date: 08/14/2016 CLINICAL DATA:  Right neck swelling for 1 month. Difficulty swallowing. History of left lung cancer with mediastinal lymphadenopathy. EXAM: NECK SOFT TISSUES - 1+ VIEW COMPARISON:  Chest CT 05/30/2016 FINDINGS: Normal alignment of  the cervical spine. Prevertebral soft tissues are normal. Normal appearance of the epiglottis. There is a right jugular Port-A-Cath which is partially visualized. Fullness in the left upper mediastinum probably related to lymphadenopathy based on the prior CT. Trachea is deviated towards the left and similar to the prior CT. IMPRESSION: Trachea is markedly deviated towards the left. Based on the previous chest CT, this is secondary to a soft tissue mass or lymphadenopathy in the lower neck and upper mediastinum. Mediastinal lymphadenopathy. Electronically Signed   By: Markus Daft M.D.   On: 08/14/2016 16:31   Dg Chest 2 View  Result Date: 08/14/2016 CLINICAL DATA:  Right side of neck swelling for 1 month EXAM: CHEST  2 VIEW COMPARISON:  05/30/2016 FINDINGS: Right chest wall port a catheter is noted with tip in the cavoatrial junction. Normal heart size. Continued complete atelectasis of the left lower lobe with leftward shift of the mediastinum. The right lung appears hyperexpanded  and clear. IMPRESSION: 1. Persistent atelectasis of the left lower lobe secondary to central obstructing pulmonary lesion. Electronically Signed   By: Kerby Moors M.D.   On: 08/14/2016 16:26   Dg Esophagus  Result Date: 08/14/2016 CLINICAL DATA:  Swelling right neck. EXAM: ESOPHOGRAM/BARIUM SWALLOW TECHNIQUE: Single contrast examination was performed using  thin barium. FLUOROSCOPY TIME:  Fluoroscopy Time:  0 minutes 30 seconds Radiation Exposure Index (if provided by the fluoroscopic device): 5.8 mGy Number of Acquired Spot Images: 17 COMPARISON:  CT 05/30/2016 . FINDINGS: Displacement of the cervical esophagus to the left is noted. This is consistent with paraesophageal adenopathy given patient's history and prior CT findings of cervical/upper mediastinum adenopathy. Scratched The esophagus is widely patent. No obstructing lesion is identified. IMPRESSION: 1. Deviation of the cervical esophagus to the left consistent with  cervical/upper mediastinal adenopathy as noted on prior recent CT of 05/30/2016. 2.  Esophagus is widely patent.  No obstructing lesion identified . Electronically Signed   By: Marcello Moores  Register   On: 08/14/2016 16:29   US Venous Img Upper Uni Right  Result Date: 07/16/2016 CLINICAL DATA:  Right upper extremity edema. History of lung cancer with right internal jugular approach port a catheter. Evaluate for DVT. EXAM: RIGHT UPPER EXTREMITY VENOUS DOPPLER ULTRASOUND TECHNIQUE: Gray-scale sonography with graded compression, as well as color Doppler and duplex ultrasound were performed to evaluate the upper extremity deep venous system from the level of the subclavian vein and including the jugular, axillary, basilic, radial, ulnar and upper cephalic vein. Spectral Doppler was utilized to evaluate flow at rest and with distal augmentation maneuvers. COMPARISON:  None. FINDINGS: Contralateral Subclavian Vein: Respiratory phasicity is normal and symmetric with the symptomatic side. No evidence of thrombus. Normal compressibility. Internal Jugular Vein: No evidence of thrombus. Normal compressibility, respiratory phasicity and response to augmentation. Subclavian Vein: No evidence of thrombus. Normal compressibility, respiratory phasicity and response to augmentation. Axillary Vein: No evidence of thrombus. Normal compressibility, respiratory phasicity and response to augmentation. Cephalic Vein: No evidence of thrombus. Normal compressibility, respiratory phasicity and response to augmentation. Basilic Vein: No evidence of thrombus. Normal compressibility, respiratory phasicity and response to augmentation. Brachial Veins: No evidence of thrombus. Normal compressibility, respiratory phasicity and response to augmentation. Radial Veins: No evidence of thrombus. Normal compressibility, respiratory phasicity and response to augmentation. Ulnar Veins: No evidence of thrombus. Normal compressibility, respiratory phasicity  and response to augmentation. Venous Reflux:  None visualized. Other Findings:  None visualized. IMPRESSION: No evidence DVT within the right upper extremity. Electronically Signed   By: Sandi Mariscal M.D.   On: 07/16/2016 11:42  "  I personally reviewed the radiologic studies    ED Course: Patient's issues with persistent cough and trouble with swallowing seems to be consistent with a rather large lymph node located on the right. She scheduled to reinitiate chemotherapy this coming Monday which may be helpful for a lymph node as this most likely is reactionary to her history of lung cancer. Her airway appears to be patent and her swallowing study shows no indications of obstruction essentially from the upper esophagus distally. She has no stridor or signs of respiratory distress and is able to speak and initiate swallowing mechanism normally. The patient was referred back to her oncologist to see if they would like to address the large lymph node on the right but otherwise I felt did not require inpatient management at this time. He was prescribed Tussionex to help her with her cough and help her get  some sleep at night and it seems to be some anxiety associated with it. She was advised to continue with all of her other current medications except hold her alprazolam at night if she is going be Tussionex. Clinical Course     Assessment: Dysphagia History of lung cancer with large right-sided cervical lymph node   Final Clinical Impression:  * Final diagnoses:  Dysphagia  Dysphagia     Plan: * Outpatient " Discharge Medication List as of 08/14/2016  6:34 PM    START taking these medications   Details  chlorpheniramine-HYDROcodone (TUSSIONEX PENNKINETIC ER) 10-8 MG/5ML SUER Take 5 mLs by mouth at bedtime as needed for cough., Starting Tue 08/14/2016, Until Tue 08/21/2016, Print      " Patient was advised to return immediately if condition worsens. Patient was advised to follow up with  their primary care physician or other specialized physicians involved in their outpatient care. The patient and/or family member/power of attorney had laboratory results reviewed at the bedside. All questions and concerns were addressed and appropriate discharge instructions were distributed by the nursing staff.             Daymon Larsen, MD 08/14/16 [phone]

## 2016-08-14 NOTE — ED Notes (Signed)
Pt alert and oriented X4, active, cooperative, pt in NAD. RR even and unlabored, color WNL.  Pt informed to return if any life threatening symptoms occur.   

## 2016-08-14 NOTE — ED Notes (Signed)
Pt prefers to not have her port accessed as she does not have her "cream". PIV started.

## 2016-08-14 NOTE — ED Notes (Signed)
MD at bedside. 

## 2016-08-14 NOTE — Discharge Instructions (Signed)
Please return immediately if condition worsens. Please contact her primary physician or the physician you were given for referral. If you have any specialist physicians involved in her treatment and plan please also contact them. Thank you for using [place] regional emergency Department.  Please contact her oncologist for further assessment of right-sided lymph node that's causing some obstructive dysphagia (trouble swallowing). Return to emergency department especially for fever, persistent vomiting, shortness of breath, or any new concerns.

## 2016-08-14 NOTE — Telephone Encounter (Signed)
Per PO Dr B, she should go to the ER. Patient in agreement with this and states she will go to ER

## 2016-08-15 ENCOUNTER — Other Ambulatory Visit: Payer: BLUE CROSS/BLUE SHIELD

## 2016-08-15 LAB — T4: T4 TOTAL: 7.8 ug/dL (ref 4.5–12.0)

## 2016-08-17 ENCOUNTER — Other Ambulatory Visit: Payer: Self-pay | Admitting: *Deleted

## 2016-08-17 DIAGNOSIS — R05 Cough: Secondary | ICD-10-CM

## 2016-08-17 DIAGNOSIS — R059 Cough, unspecified: Secondary | ICD-10-CM

## 2016-08-17 MED ORDER — BENZONATATE 200 MG PO CAPS: 200.0000 mg | ORAL_CAPSULE | Freq: Three times a day (TID) | ORAL | 0 refills | Status: DC | PRN

## 2016-08-20 ENCOUNTER — Other Ambulatory Visit: Payer: Self-pay

## 2016-08-20 ENCOUNTER — Inpatient Hospital Stay: Payer: BLUE CROSS/BLUE SHIELD

## 2016-08-20 ENCOUNTER — Inpatient Hospital Stay: Payer: BLUE CROSS/BLUE SHIELD | Attending: Internal Medicine

## 2016-08-20 ENCOUNTER — Inpatient Hospital Stay (HOSPITAL_BASED_OUTPATIENT_CLINIC_OR_DEPARTMENT_OTHER): Payer: BLUE CROSS/BLUE SHIELD | Admitting: Internal Medicine

## 2016-08-20 VITALS — BP 134/83 | HR 90 | Resp 18

## 2016-08-20 VITALS — BP 95/62 | HR 93 | Temp 97.2°F | Resp 16 | Ht 65.0 in | Wt 131.5 lb

## 2016-08-20 DIAGNOSIS — Z9221 Personal history of antineoplastic chemotherapy: Secondary | ICD-10-CM | POA: Insufficient documentation

## 2016-08-20 DIAGNOSIS — R590 Localized enlarged lymph nodes: Secondary | ICD-10-CM

## 2016-08-20 DIAGNOSIS — R1310 Dysphagia, unspecified: Secondary | ICD-10-CM

## 2016-08-20 DIAGNOSIS — R630 Anorexia: Secondary | ICD-10-CM | POA: Diagnosis not present

## 2016-08-20 DIAGNOSIS — D6481 Anemia due to antineoplastic chemotherapy: Secondary | ICD-10-CM

## 2016-08-20 DIAGNOSIS — Z79899 Other long term (current) drug therapy: Secondary | ICD-10-CM | POA: Diagnosis not present

## 2016-08-20 DIAGNOSIS — C3432 Malignant neoplasm of lower lobe, left bronchus or lung: Secondary | ICD-10-CM

## 2016-08-20 DIAGNOSIS — R0602 Shortness of breath: Secondary | ICD-10-CM | POA: Diagnosis not present

## 2016-08-20 DIAGNOSIS — C7970 Secondary malignant neoplasm of unspecified adrenal gland: Secondary | ICD-10-CM

## 2016-08-20 DIAGNOSIS — Z5111 Encounter for antineoplastic chemotherapy: Secondary | ICD-10-CM | POA: Diagnosis not present

## 2016-08-20 DIAGNOSIS — R05 Cough: Secondary | ICD-10-CM | POA: Diagnosis not present

## 2016-08-20 DIAGNOSIS — E119 Type 2 diabetes mellitus without complications: Secondary | ICD-10-CM | POA: Diagnosis not present

## 2016-08-20 DIAGNOSIS — Z923 Personal history of irradiation: Secondary | ICD-10-CM | POA: Insufficient documentation

## 2016-08-20 DIAGNOSIS — C7931 Secondary malignant neoplasm of brain: Secondary | ICD-10-CM | POA: Insufficient documentation

## 2016-08-20 DIAGNOSIS — G893 Neoplasm related pain (acute) (chronic): Secondary | ICD-10-CM | POA: Insufficient documentation

## 2016-08-20 DIAGNOSIS — Z87891 Personal history of nicotine dependence: Secondary | ICD-10-CM | POA: Insufficient documentation

## 2016-08-20 DIAGNOSIS — Z7984 Long term (current) use of oral hypoglycemic drugs: Secondary | ICD-10-CM | POA: Diagnosis not present

## 2016-08-20 DIAGNOSIS — R5383 Other fatigue: Secondary | ICD-10-CM | POA: Diagnosis not present

## 2016-08-20 DIAGNOSIS — R591 Generalized enlarged lymph nodes: Secondary | ICD-10-CM

## 2016-08-20 LAB — COMPREHENSIVE METABOLIC PANEL
ALBUMIN: 3.5 g/dL (ref 3.5–5.0)
ALT: 12 U/L — AB (ref 14–54)
AST: 15 U/L (ref 15–41)
Alkaline Phosphatase: 70 U/L (ref 38–126)
Anion gap: 7 (ref 5–15)
BUN: 22 mg/dL — ABNORMAL HIGH (ref 6–20)
CHLORIDE: 102 mmol/L (ref 101–111)
CO2: 26 mmol/L (ref 22–32)
CREATININE: 0.8 mg/dL (ref 0.44–1.00)
Calcium: 9.2 mg/dL (ref 8.9–10.3)
GFR calc Af Amer: >60 mL/min (ref >60)
GFR calc non Af Amer: >60 mL/min (ref >60)
GLUCOSE: 105 mg/dL — AB (ref 65–99)
Potassium: 3.6 mmol/L (ref 3.5–5.1)
SODIUM: 135 mmol/L (ref 135–145)
Total Bilirubin: 0.5 mg/dL (ref 0.3–1.2)
Total Protein: 7.3 g/dL (ref 6.5–8.1)

## 2016-08-20 LAB — CBC WITH DIFFERENTIAL/PLATELET
BASOS ABS: 0 10*3/uL (ref 0–0.1)
BASOS PCT: 0 %
EOS ABS: 0 10*3/uL (ref 0–0.7)
EOS PCT: 0 %
HCT: 23.6 % — ABNORMAL LOW (ref 35.0–47.0)
Hemoglobin: 8 g/dL — ABNORMAL LOW (ref 12.0–16.0)
LYMPHS ABS: 1.4 10*3/uL (ref 1.0–3.6)
Lymphocytes Relative: 10 %
MCH: 34.9 pg — AB (ref 26.0–34.0)
MCHC: 34 g/dL (ref 32.0–36.0)
MCV: 102.6 fL — ABNORMAL HIGH (ref 80.0–100.0)
Monocytes Absolute: 1.7 10*3/uL — ABNORMAL HIGH (ref 0.2–0.9)
Monocytes Relative: 12 %
NEUTROS PCT: 78 %
Neutro Abs: 10.8 10*3/uL — ABNORMAL HIGH (ref 1.4–6.5)
PLATELETS: 157 10*3/uL (ref 150–440)
RBC: 2.3 MIL/uL — AB (ref 3.80–5.20)
RDW: 21.3 % — ABNORMAL HIGH (ref 11.5–14.5)
WBC: 13.9 10*3/uL — AB (ref 3.6–11.0)

## 2016-08-20 MED ORDER — ETOPOSIDE CHEMO INJECTION 1 GM/50ML
140.0000 mg | Freq: Once | INTRAVENOUS | Status: AC
  Administered 2016-08-20: 140 mg via INTRAVENOUS
  Filled 2016-08-20: qty 7

## 2016-08-20 MED ORDER — PALONOSETRON HCL INJECTION 0.25 MG/5ML
0.2500 mg | Freq: Once | INTRAVENOUS | Status: AC
  Administered 2016-08-20: 0.25 mg via INTRAVENOUS
  Filled 2016-08-20: qty 5

## 2016-08-20 MED ORDER — HEPARIN SOD (PORK) LOCK FLUSH 100 UNIT/ML IV SOLN
500.0000 [IU] | Freq: Once | INTRAVENOUS | Status: AC | PRN
  Administered 2016-08-20: 500 [IU]

## 2016-08-20 MED ORDER — SODIUM CHLORIDE 0.9 % IV SOLN
453.5000 mg | Freq: Once | INTRAVENOUS | Status: AC
  Administered 2016-08-20: 450 mg via INTRAVENOUS
  Filled 2016-08-20: qty 45

## 2016-08-20 MED ORDER — SODIUM CHLORIDE 0.9 % IV SOLN
10.0000 mg | Freq: Once | INTRAVENOUS | Status: AC
  Administered 2016-08-20: 10 mg via INTRAVENOUS
  Filled 2016-08-20: qty 1

## 2016-08-20 MED ORDER — HEPARIN SOD (PORK) LOCK FLUSH 100 UNIT/ML IV SOLN
500.0000 [IU] | Freq: Once | INTRAVENOUS | Status: AC
  Filled 2016-08-20: qty 5

## 2016-08-20 MED ORDER — SODIUM CHLORIDE 0.9% FLUSH
10.0000 mL | Freq: Once | INTRAVENOUS | Status: AC
  Administered 2016-08-20: 10 mL via INTRAVENOUS
  Filled 2016-08-20: qty 10

## 2016-08-20 MED ORDER — SODIUM CHLORIDE 0.9 % IV SOLN
Freq: Once | INTRAVENOUS | Status: AC
  Administered 2016-08-20: 11:00:00 via INTRAVENOUS
  Filled 2016-08-20: qty 1000

## 2016-08-20 NOTE — Assessment & Plan Note (Addendum)
Recurrent small cell lung cancer- Status post cycle #3  carboplatin etoposide- however concerns of progression in right neck [see discussion below]  # proceed with # 4 of carbo-etoposide; plan CT chest neck ASAP  #  Difficulty swallowing-? X-ray suggestive of external obstruction from necrotic LN.  Recommend RT evaluation. Spoke to Dr.Crystal re: RT to right neck. Labs- okay- platelets- 100k.   # Anemia sec to chemo- plan hold tube/labs in 10days/  # follow up with MD/labs 10 days; follow up with me in 3 weeks for chemo/labs.

## 2016-08-20 NOTE — Progress Notes (Signed)
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 Diagnosis in January of 2017. Dx-  right supraclavicular lymph node biopsy.  # T2 N3 M1 stage IV disease with metastases to the brain, kidney, and adrenal gland.  # S/p WBRT radiation therapy. (Feb of 2017;]  Startingchemotherapy December 26, 2015 with carboplatinum and VP-16.  # July 12th 2017- Recurrence/ progression- on CT- Carbo-Etopside    # MOLECULAR TESTING- July 13th-[facility]- STABLE; [facility]- NOT enough tissue      Cancer of lower lobe of left lung ([place])   12/05/2015 Initial Diagnosis    Cancer of lower lobe of left lung ([place])       INTERVAL HISTORY:  Cynthia Carson 65 y.o.  female pleasant patient above history of recurrent extensive stage small cell lung cancer is here for follow-up. Patient is status post cycle #3 appx 4 weeks ago.   In the interim patient was seen in the emergency room for difficulty swallowing barium esophagram showed no stenosis of the esophagus; deviation to the left cervical esophagus- likely from external adenopathy on the right.  Complains of mild swelling in the right neck again in the last few weeks- mostly with solids. April to swallow liquids okay.. Cough slightly improved. Shortness of breath no worse. No hemoptysis. Denies any chest pain. No nausea vomiting with the chemotherapy.  REVIEW OF SYSTEMS:  A complete 10 point review of system is done which is negative except mentioned above/history of present illness.   PAST MEDICAL HISTORY :  Past Medical History:  Diagnosis Date  . Cancer of lung  ([place]) 12/05/2015  . Diabetes mellitus without complication ([place])     PAST SURGICAL HISTORY :   Past Surgical History:  Procedure Laterality Date  . ABDOMINAL HYSTERECTOMY    . PERIPHERAL VASCULAR CATHETERIZATION N/A 01/13/2016   Procedure: Glori Luis Cath Insertion;  Surgeon: Katha Cabal, MD;  Location: [facility];  Service: Cardiovascular;  Laterality: N/A;    FAMILY HISTORY :  No family history on file.  SOCIAL HISTORY:   Social History  Substance Use Topics  . Smoking status: Former Smoker    Packs/day: 1.00    Years: 49.00    Types: Cigarettes    Quit date: 11/19/2011  . Smokeless tobacco: Not on file  . Alcohol use No    ALLERGIES:  is allergic to amoxicillin and levofloxacin.  MEDICATIONS:  Current Outpatient Prescriptions  Medication Sig Dispense Refill  . ALPRAZolam (XANAX) 0.5 MG tablet Take 0.5 mg by mouth 3 (three) times daily as needed for anxiety.     . benzonatate (TESSALON) 200 MG capsule Take 1 capsule (200 mg total) by mouth 3 (three) times daily as needed for cough. 30 capsule 0  . Calcium Carbonate-Vitamin D (CALCIUM-VITAMIN D) 500-200 MG-UNIT tablet Take 1 tablet by mouth daily.    Marland Kitchen glimepiride (AMARYL) 2 MG tablet Take 2 mg by mouth daily with breakfast.     . KLOR-CON M20 20 MEQ tablet TAKE ONE TABLET BY MOUTH DAILY 30 tablet 3  . latanoprost (XALATAN) 0.005 % ophthalmic solution Place 1 drop  into both eyes at bedtime.    . lidocaine-prilocaine (EMLA) cream Apply 1 application topically as needed. 30 g 3  . magic mouthwash w/lidocaine SOLN Take 5 mLs by mouth 4 (four) times daily. 80 ml viscous lidocaine 2%, 80 ml Mylanta, 80 ml Diphenhydramine 12.5 mg/5 ml Elixir, 80 ml Nystatin 100,000 Unit suspension, 80 ml Prednisolone 15 mg/10m, 80 ml Distilled Water. Sig: Swish/Swallow 5-10 ml four times a day as needed. Dispense 480 ml. 3RFs 480 mL 0  . omeprazole (PRILOSEC) 20 MG capsule Take 20 mg by mouth daily.     . sitaGLIPtin (JANUVIA) 100 MG  tablet Take 100 mg by mouth daily.    . chlorpheniramine-HYDROcodone (TUSSIONEX PENNKINETIC ER) 10-8 MG/5ML SUER Take 5 mLs by mouth at bedtime as needed for cough. (Patient not taking: Reported on 08/20/2016) 40 mL 0  . prochlorperazine (COMPAZINE) 10 MG tablet Take 1 tablet (10 mg total) by mouth every 6 (six) hours as needed for nausea or vomiting. (Patient not taking: Reported on 08/20/2016) 30 tablet 3   No current facility-administered medications for this visit.    Facility-Administered Medications Ordered in Other Visits  Medication Dose Route Frequency Provider Last Rate Last Dose  . alteplase (CATHFLO ACTIVASE) injection 2 mg  2 mg Intracatheter Once PRN JForest Gleason MD      . heparin lock flush 100 unit/mL  500 Units Intravenous Once JForest Gleason MD      . heparin lock flush 100 unit/mL  500 Units Intracatheter Once PRN JForest Gleason MD      . heparin lock flush 100 unit/mL  250 Units Intracatheter Once PRN JForest Gleason MD      . sodium chloride 0.9 % injection 10 mL  10 mL Intracatheter PRN JForest Gleason MD      . sodium chloride flush (NS) 0.9 % injection 10 mL  10 mL Intravenous PRN JForest Gleason MD      . sodium chloride flush (NS) 0.9 % injection 10 mL  10 mL Intravenous PRN JForest Gleason MD   10 mL at 02/01/16 1439    PHYSICAL EXAMINATION: ECOG PERFORMANCE STATUS: 1 - Symptomatic but completely ambulatory  BP 95/62 (BP Location: Left Arm, Patient Position: Sitting)   Pulse 93   Temp 97.2 F (36.2 C) (Tympanic)   Resp 16   Ht 5' 5"  (1.651 m)   Wt 131 lb 8 oz (59.6 kg)   BMI 21.88 kg/m   Filed Weights   08/20/16 0915  Weight: 131 lb 8 oz (59.6 kg)    GENERAL: Thin built well-developed; Alert, no distress and comfortable. She is alone.  EYES: no pallor or icterus OROPHARYNX: no thrush or ulceration; good dentition  NECK: supple, no masses felt; tenderness on the right side of neck.  LYMPH: 2-3 cm Right supraclavicular adenopathy No axillary or inguinal  regions LUNGS: Decreased results of the left lower base No wheeze or crackles HEART/CVS: regular rate & rhythm and no murmurs; No lower extremity edema ABDOMEN:abdomen soft, non-tender and normal bowel sounds Musculoskeletal:no cyanosis of digits and no clubbing  PSYCH: alert & oriented x 3 with fluent speech NEURO: no focal motor/sensory deficits SKIN:  no rashes or significant lesions  LABORATORY DATA:  I have reviewed the data as listed    Component Value Date/Time   NA 135 08/20/2016 0850   K 3.6 08/20/2016 0850   CL 102 08/20/2016 0850   CO2 26 08/20/2016 0850   GLUCOSE 105 (H) 08/20/2016 0850   BUN 22 (H)  08/20/2016 0850   CREATININE 0.80 08/20/2016 0850   CALCIUM 9.2 08/20/2016 0850   PROT 7.3 08/20/2016 0850   ALBUMIN 3.5 08/20/2016 0850   AST 15 08/20/2016 0850   ALT 12 (L) 08/20/2016 0850   ALKPHOS 70 08/20/2016 0850   BILITOT 0.5 08/20/2016 0850   GFRNONAA >60 08/20/2016 0850   GFRAA >60 08/20/2016 0850    No results found for: SPEP, UPEP  Lab Results  Component Value Date   WBC 13.9 (H) 08/20/2016   NEUTROABS 10.8 (H) 08/20/2016   HGB 8.0 (L) 08/20/2016   HCT 23.6 (L) 08/20/2016   MCV 102.6 (H) 08/20/2016   PLT 157 08/20/2016      Chemistry      Component Value Date/Time   NA 135 08/20/2016 0850   K 3.6 08/20/2016 0850   CL 102 08/20/2016 0850   CO2 26 08/20/2016 0850   BUN 22 (H) 08/20/2016 0850   CREATININE 0.80 08/20/2016 0850      Component Value Date/Time   CALCIUM 9.2 08/20/2016 0850   ALKPHOS 70 08/20/2016 0850   AST 15 08/20/2016 0850   ALT 12 (L) 08/20/2016 0850   BILITOT 0.5 08/20/2016 0850       RADIOGRAPHIC STUDIES: I have personally reviewed the radiological images as listed and agreed with the findings in the report. No results found.   ASSESSMENT & PLAN:  Cancer of lower lobe of left lung ([facility]) Recurrent small cell lung cancer- Status post cycle #3  carboplatin etoposide- however concerns of progression in right neck [see  discussion below]  # proceed with # 4 of carbo-etoposide; plan CT chest neck ASAP  #  Difficulty swallowing-? X-ray suggestive of external obstruction from necrotic LN.  Recommend RT evaluation. Spoke to Dr.Crystal re: RT to right neck. Labs- okay- platelets- 100k.   # Anemia sec to chemo- plan hold tube/labs in 10days/  # follow up with MD/labs 10 days; follow up with me in 3 weeks for chemo/labs.   Orders Placed This Encounter  Procedures  . CT CHEST W CONTRAST    Standing Status:   Future    Standing Expiration Date:   10/20/2017    Order Specific Question:   Reason for Exam (SYMPTOM  OR DIAGNOSIS REQUIRED)    Answer:   small cell- lung cancer    Order Specific Question:   Preferred imaging location?    Answer:   [facility]  . CT SOFT TISSUE NECK W CONTRAST    Standing Status:   Future    Standing Expiration Date:   11/19/2017    Order Specific Question:   Reason for Exam (SYMPTOM  OR DIAGNOSIS REQUIRED)    Answer:   small cell- lung cancer    Order Specific Question:   Preferred imaging location?    Answer:   [facility]   All questions were answered. The patient knows to call the clinic with any problems, questions or concerns.      Cammie Sickle, MD 08/20/2016 1:25 PM

## 2016-08-20 NOTE — Progress Notes (Signed)
Patient c/o dysphagia with medications. Easily chokes on oral tablets and thickened foods such as grits or oatmeals.  Dysphagia contributed to 1 episode of vomiting due to choking sensation on foods.  Frequent coughs-unproductive.  Denies any fevers/ chills.  C/o right lateral neck swelling-which has improved since last week.  C/o minor constipation improved with stool softners. last BM this morning. Denies any nausea today.

## 2016-08-21 ENCOUNTER — Inpatient Hospital Stay: Payer: BLUE CROSS/BLUE SHIELD

## 2016-08-21 ENCOUNTER — Telehealth: Payer: Self-pay | Admitting: *Deleted

## 2016-08-21 VITALS — BP 113/70 | HR 93 | Temp 97.9°F | Resp 18

## 2016-08-21 DIAGNOSIS — C3432 Malignant neoplasm of lower lobe, left bronchus or lung: Secondary | ICD-10-CM

## 2016-08-21 DIAGNOSIS — R590 Localized enlarged lymph nodes: Secondary | ICD-10-CM

## 2016-08-21 MED ORDER — SODIUM CHLORIDE 0.9 % IJ SOLN
10.0000 mL | Freq: Once | INTRAMUSCULAR | Status: AC
  Administered 2016-08-21: 10 mL via INTRAVENOUS
  Filled 2016-08-21: qty 10

## 2016-08-21 MED ORDER — HEPARIN SOD (PORK) LOCK FLUSH 100 UNIT/ML IV SOLN
INTRAVENOUS | Status: AC
  Filled 2016-08-21: qty 5

## 2016-08-21 MED ORDER — SODIUM CHLORIDE 0.9 % IV SOLN
Freq: Once | INTRAVENOUS | Status: AC
  Administered 2016-08-21: 14:00:00 via INTRAVENOUS
  Filled 2016-08-21: qty 1000

## 2016-08-21 MED ORDER — SODIUM CHLORIDE 0.9 % IV SOLN
10.0000 mg | Freq: Once | INTRAVENOUS | Status: AC
  Administered 2016-08-21: 10 mg via INTRAVENOUS
  Filled 2016-08-21: qty 1

## 2016-08-21 MED ORDER — HEPARIN SOD (PORK) LOCK FLUSH 100 UNIT/ML IV SOLN
500.0000 [IU] | Freq: Once | INTRAVENOUS | Status: AC
  Administered 2016-08-21: 500 [IU] via INTRAVENOUS

## 2016-08-21 MED ORDER — SODIUM CHLORIDE 0.9 % IV SOLN
140.0000 mg | Freq: Once | INTRAVENOUS | Status: AC
  Administered 2016-08-21: 140 mg via INTRAVENOUS
  Filled 2016-08-21: qty 7

## 2016-08-21 NOTE — Telephone Encounter (Signed)
Requesting med for cough, she currently has Tessalon Pearls she is not taking but once a day if that and is not using the Tussionex. Advised her to take the Tessalon 3 times a day as directed and to use the Tussionex at bedtime as directed. She will try that and call back if not better.

## 2016-08-22 ENCOUNTER — Ambulatory Visit
Admission: RE | Admit: 2016-08-22 | Discharge: 2016-08-22 | Disposition: A | Discharge status: Home / Self Care | Payer: BLUE CROSS/BLUE SHIELD | Source: Ambulatory Visit | Attending: Radiation Oncology | Admitting: Radiation Oncology

## 2016-08-22 ENCOUNTER — Ambulatory Visit
Admission: RE | Admit: 2016-08-22 | Discharge: 2016-08-22 | Disposition: A | Discharge status: Home / Self Care | Payer: BLUE CROSS/BLUE SHIELD | Source: Ambulatory Visit | Attending: Internal Medicine | Admitting: Internal Medicine

## 2016-08-22 ENCOUNTER — Encounter: Payer: Self-pay | Admitting: Radiation Oncology

## 2016-08-22 ENCOUNTER — Inpatient Hospital Stay: Payer: BLUE CROSS/BLUE SHIELD

## 2016-08-22 VITALS — BP 106/76 | HR 102 | Temp 97.3°F | Resp 20 | Wt 131.2 lb

## 2016-08-22 DIAGNOSIS — F17210 Nicotine dependence, cigarettes, uncomplicated: Secondary | ICD-10-CM | POA: Insufficient documentation

## 2016-08-22 DIAGNOSIS — C7970 Secondary malignant neoplasm of unspecified adrenal gland: Secondary | ICD-10-CM | POA: Diagnosis not present

## 2016-08-22 DIAGNOSIS — R590 Localized enlarged lymph nodes: Secondary | ICD-10-CM | POA: Insufficient documentation

## 2016-08-22 DIAGNOSIS — C779 Secondary and unspecified malignant neoplasm of lymph node, unspecified: Secondary | ICD-10-CM | POA: Diagnosis not present

## 2016-08-22 DIAGNOSIS — C7931 Secondary malignant neoplasm of brain: Secondary | ICD-10-CM | POA: Insufficient documentation

## 2016-08-22 DIAGNOSIS — C3490 Malignant neoplasm of unspecified part of unspecified bronchus or lung: Secondary | ICD-10-CM | POA: Diagnosis not present

## 2016-08-22 DIAGNOSIS — C3492 Malignant neoplasm of unspecified part of left bronchus or lung: Secondary | ICD-10-CM

## 2016-08-22 DIAGNOSIS — J9819 Other pulmonary collapse: Secondary | ICD-10-CM | POA: Insufficient documentation

## 2016-08-22 DIAGNOSIS — C3432 Malignant neoplasm of lower lobe, left bronchus or lung: Secondary | ICD-10-CM | POA: Insufficient documentation

## 2016-08-22 DIAGNOSIS — E079 Disorder of thyroid, unspecified: Secondary | ICD-10-CM | POA: Insufficient documentation

## 2016-08-22 MED ORDER — IOPAMIDOL (ISOVUE-300) INJECTION 61%
75.0000 mL | Freq: Once | INTRAVENOUS | Status: AC | PRN
  Administered 2016-08-22: 75 mL via INTRAVENOUS

## 2016-08-22 MED ORDER — SODIUM CHLORIDE 0.9 % IV SOLN
Freq: Once | INTRAVENOUS | Status: AC
  Administered 2016-08-22: 12:00:00 via INTRAVENOUS
  Filled 2016-08-22: qty 1000

## 2016-08-22 MED ORDER — HEPARIN SOD (PORK) LOCK FLUSH 100 UNIT/ML IV SOLN
500.0000 [IU] | Freq: Once | INTRAVENOUS | Status: AC
  Administered 2016-08-22: 500 [IU] via INTRAVENOUS
  Filled 2016-08-22: qty 5

## 2016-08-22 MED ORDER — SODIUM CHLORIDE 0.9 % IV SOLN
10.0000 mg | Freq: Once | INTRAVENOUS | Status: AC
  Administered 2016-08-22: 10 mg via INTRAVENOUS
  Filled 2016-08-22: qty 1

## 2016-08-22 MED ORDER — PEGFILGRASTIM 6 MG/0.6ML ~~LOC~~ PSKT
6.0000 mg | PREFILLED_SYRINGE | Freq: Once | SUBCUTANEOUS | Status: AC
  Administered 2016-08-22: 6 mg via SUBCUTANEOUS
  Filled 2016-08-22: qty 0.6

## 2016-08-22 MED ORDER — SODIUM CHLORIDE 0.9 % IV SOLN
140.0000 mg | Freq: Once | INTRAVENOUS | Status: AC
  Administered 2016-08-22: 140 mg via INTRAVENOUS
  Filled 2016-08-22: qty 7

## 2016-08-22 NOTE — Progress Notes (Signed)
Radiation Oncology Follow up Note  Name: Cynthia Carson   Date:   08/22/2016 MRN:  [id number] DOB: 03-Feb-1951    This 65 y.o. female presents to the clinic today for old patient new area. In patient previous to treat the whole brain for malignant metastatic disease from stage IV small cell lung cancer now with large right supraclavicular adenopathy causing problems with dysphasia  REFERRING PROVIDER: Marden Noble, MD  HPI: Patient is a 65 year old female well known to our department having been treated back in March 2017 to her whole brain for metastatic disease from stage IV small cell lung cancer. She had extensive stage disease with bulky chest adenopathy as well as brain kidney and adrenal metastasis.. She is currently on [place] etoposide although has had increasing difficulty swallowing and has has visible bulky adenopathy extending from the right supraclavicular fossa into the right lower cervical chain. She probably has extrinsic compression on her esophagus causing dysphagia. I been asked to evaluate her for possible palliative radiation therapy.  COMPLICATIONS OF TREATMENT: none  FOLLOW UP COMPLIANCE: keeps appointments   PHYSICAL EXAM:  BP 106/76   Pulse (!) 102   Temp 97.3 F (36.3 C)   Resp 20   Wt 131 lb 2.8 oz (59.5 kg)   BMI 21.83 kg/m  Patient has massive adenopathy in the right supraclavicular fossa extending into the right lower cervical chain. Neurologic examination is essentially unchanged. Well-developed well-nourished patient in NAD. HEENT reveals PERLA, EOMI, discs not visualized.  Oral cavity is clear. No oral mucosal lesions are identified. Neck is clear without evidence of cervical or supraclavicular adenopathy. Lungs are clear to A&P. Cardiac examination is essentially unremarkable with regular rate and rhythm without murmur rub or thrill. Abdomen is benign with no organomegaly or masses noted. Motor sensory and DTR levels are equal and symmetric in the upper and  lower extremities. Cranial nerves II through XII are grossly intact. Proprioception is intact. No peripheral adenopathy or edema is identified. No motor or sensory levels are noted. Crude visual fields are within normal range.  RADIOLOGY RESULTS: CT scan of chest has been ordered and will be reviewed  PLAN: At this time I to go ahead with a short palliative course of radiation therapy to her right neck and supraclavicular fossa. Would plan on delivering 3000 cGy in 10 fractions. Will review her CT scan prior to CT simulation to make definitive estimation of her treatment fields. Risks and benefits of treatment including possible radiation esophagitis fatigue skin reaction alteration of blood counts all were discussed in detail with the patient. I firstly set up and ordered CT simulation for early next week and will review her CT scans prior to treatment planning. Patient seems to comprehend my treatment plan well.  I would like to take this opportunity to thank you for allowing me to participate in the care of your patient.Armstead Peaks., MD

## 2016-08-27 ENCOUNTER — Ambulatory Visit
Admission: RE | Admit: 2016-08-27 | Discharge: 2016-08-27 | Disposition: A | Discharge status: Home / Self Care | Payer: BLUE CROSS/BLUE SHIELD | Source: Ambulatory Visit | Attending: Radiation Oncology | Admitting: Radiation Oncology

## 2016-08-27 DIAGNOSIS — R590 Localized enlarged lymph nodes: Secondary | ICD-10-CM | POA: Diagnosis not present

## 2016-08-30 ENCOUNTER — Inpatient Hospital Stay: Payer: BLUE CROSS/BLUE SHIELD

## 2016-08-30 ENCOUNTER — Encounter: Payer: Self-pay | Admitting: *Deleted

## 2016-08-30 DIAGNOSIS — C3432 Malignant neoplasm of lower lobe, left bronchus or lung: Secondary | ICD-10-CM | POA: Diagnosis not present

## 2016-08-30 LAB — CBC WITH DIFFERENTIAL/PLATELET
BASOS ABS: 0 10*3/uL (ref 0–0.1)
BASOS PCT: 1 %
EOS ABS: 0 10*3/uL (ref 0–0.7)
EOS PCT: 2 %
HCT: 17.4 % — ABNORMAL LOW (ref 35.0–47.0)
Hemoglobin: 6 g/dL — ABNORMAL LOW (ref 12.0–16.0)
LYMPHS ABS: 1.1 10*3/uL (ref 1.0–3.6)
Lymphocytes Relative: 56 %
MCH: 35.7 pg — AB (ref 26.0–34.0)
MCHC: 34.2 g/dL (ref 32.0–36.0)
MCV: 104.3 fL — ABNORMAL HIGH (ref 80.0–100.0)
Monocytes Absolute: 0.1 10*3/uL — ABNORMAL LOW (ref 0.2–0.9)
Monocytes Relative: 5 %
NEUTROS PCT: 36 %
Neutro Abs: 0.7 10*3/uL — ABNORMAL LOW (ref 1.4–6.5)
PLATELETS: 19 10*3/uL — AB (ref 150–440)
RBC: 1.67 MIL/uL — AB (ref 3.80–5.20)
RDW: 17.8 % — ABNORMAL HIGH (ref 11.5–14.5)
WBC: 1.9 10*3/uL — AB (ref 3.6–11.0)

## 2016-08-30 LAB — SAMPLE TO BLOOD BANK

## 2016-08-30 LAB — BASIC METABOLIC PANEL
ANION GAP: 9 (ref 5–15)
BUN: 19 mg/dL (ref 6–20)
CHLORIDE: 102 mmol/L (ref 101–111)
CO2: 24 mmol/L (ref 22–32)
Calcium: 8.5 mg/dL — ABNORMAL LOW (ref 8.9–10.3)
Creatinine, Ser: 0.85 mg/dL (ref 0.44–1.00)
Glucose, Bld: 157 mg/dL — ABNORMAL HIGH (ref 65–99)
POTASSIUM: 3.9 mmol/L (ref 3.5–5.1)
SODIUM: 135 mmol/L (ref 135–145)

## 2016-08-30 NOTE — Progress Notes (Signed)
Received a phone call from Ewing, RN in cancer center lab at 1155 am. Reports critical plt count 19.  Contacted Dr. Rogue Bussing at 1200. Reported critical result read back process performed with md.

## 2016-08-31 ENCOUNTER — Telehealth: Payer: Self-pay | Admitting: *Deleted

## 2016-08-31 DIAGNOSIS — C3432 Malignant neoplasm of lower lobe, left bronchus or lung: Secondary | ICD-10-CM

## 2016-08-31 NOTE — Telephone Encounter (Signed)
-----   Message from Cammie Sickle, MD sent at 08/31/2016  8:21 AM EDT ----- Check labs-cbc/bmp next Thursday; and also follow up with me the same day; I need to discuss her scans. Thx

## 2016-08-31 NOTE — Telephone Encounter (Signed)
Lab orders entered per md order.   msg sent to cancer center sch. To arrange for apt on 10/19

## 2016-09-03 ENCOUNTER — Inpatient Hospital Stay: Payer: BLUE CROSS/BLUE SHIELD

## 2016-09-03 ENCOUNTER — Ambulatory Visit
Admission: RE | Admit: 2016-09-03 | Discharge: 2016-09-03 | Disposition: A | Discharge status: Home / Self Care | Payer: BLUE CROSS/BLUE SHIELD | Source: Ambulatory Visit | Attending: Radiation Oncology | Admitting: Radiation Oncology

## 2016-09-03 DIAGNOSIS — R590 Localized enlarged lymph nodes: Secondary | ICD-10-CM | POA: Diagnosis not present

## 2016-09-04 ENCOUNTER — Ambulatory Visit
Admission: RE | Admit: 2016-09-04 | Discharge: 2016-09-04 | Disposition: A | Discharge status: Home / Self Care | Payer: BLUE CROSS/BLUE SHIELD | Source: Ambulatory Visit | Attending: Radiation Oncology | Admitting: Radiation Oncology

## 2016-09-04 ENCOUNTER — Other Ambulatory Visit: Payer: Self-pay | Admitting: *Deleted

## 2016-09-04 ENCOUNTER — Inpatient Hospital Stay: Payer: BLUE CROSS/BLUE SHIELD

## 2016-09-04 DIAGNOSIS — R590 Localized enlarged lymph nodes: Secondary | ICD-10-CM | POA: Diagnosis not present

## 2016-09-04 MED ORDER — AZITHROMYCIN 250 MG PO TABS: ORAL_TABLET | ORAL | 0 refills | Status: DC

## 2016-09-05 ENCOUNTER — Inpatient Hospital Stay: Payer: BLUE CROSS/BLUE SHIELD

## 2016-09-05 ENCOUNTER — Ambulatory Visit
Admission: RE | Admit: 2016-09-05 | Discharge: 2016-09-05 | Disposition: A | Discharge status: Home / Self Care | Payer: BLUE CROSS/BLUE SHIELD | Source: Ambulatory Visit | Attending: Radiation Oncology | Admitting: Radiation Oncology

## 2016-09-05 DIAGNOSIS — R590 Localized enlarged lymph nodes: Secondary | ICD-10-CM | POA: Diagnosis not present

## 2016-09-06 ENCOUNTER — Ambulatory Visit: Payer: BLUE CROSS/BLUE SHIELD

## 2016-09-06 ENCOUNTER — Inpatient Hospital Stay: Payer: BLUE CROSS/BLUE SHIELD | Admitting: Internal Medicine

## 2016-09-06 ENCOUNTER — Inpatient Hospital Stay: Payer: BLUE CROSS/BLUE SHIELD

## 2016-09-07 ENCOUNTER — Ambulatory Visit: Payer: BLUE CROSS/BLUE SHIELD

## 2016-09-07 ENCOUNTER — Inpatient Hospital Stay: Payer: BLUE CROSS/BLUE SHIELD

## 2016-09-10 ENCOUNTER — Inpatient Hospital Stay (HOSPITAL_BASED_OUTPATIENT_CLINIC_OR_DEPARTMENT_OTHER): Payer: BLUE CROSS/BLUE SHIELD | Admitting: Internal Medicine

## 2016-09-10 ENCOUNTER — Inpatient Hospital Stay: Payer: BLUE CROSS/BLUE SHIELD

## 2016-09-10 ENCOUNTER — Ambulatory Visit
Admission: RE | Admit: 2016-09-10 | Discharge: 2016-09-10 | Disposition: A | Discharge status: Home / Self Care | Payer: BLUE CROSS/BLUE SHIELD | Source: Ambulatory Visit | Attending: Internal Medicine | Admitting: Internal Medicine

## 2016-09-10 ENCOUNTER — Telehealth: Payer: Self-pay

## 2016-09-10 ENCOUNTER — Ambulatory Visit: Payer: BLUE CROSS/BLUE SHIELD

## 2016-09-10 VITALS — BP 96/62 | HR 106 | Temp 99.3°F | Resp 18 | Wt 131.4 lb

## 2016-09-10 DIAGNOSIS — C7970 Secondary malignant neoplasm of unspecified adrenal gland: Secondary | ICD-10-CM | POA: Diagnosis not present

## 2016-09-10 DIAGNOSIS — T451X5A Adverse effect of antineoplastic and immunosuppressive drugs, initial encounter: Secondary | ICD-10-CM

## 2016-09-10 DIAGNOSIS — Z79899 Other long term (current) drug therapy: Secondary | ICD-10-CM

## 2016-09-10 DIAGNOSIS — R05 Cough: Secondary | ICD-10-CM

## 2016-09-10 DIAGNOSIS — Z9221 Personal history of antineoplastic chemotherapy: Secondary | ICD-10-CM

## 2016-09-10 DIAGNOSIS — D6481 Anemia due to antineoplastic chemotherapy: Secondary | ICD-10-CM

## 2016-09-10 DIAGNOSIS — R0602 Shortness of breath: Secondary | ICD-10-CM

## 2016-09-10 DIAGNOSIS — C3432 Malignant neoplasm of lower lobe, left bronchus or lung: Secondary | ICD-10-CM

## 2016-09-10 DIAGNOSIS — R5383 Other fatigue: Secondary | ICD-10-CM

## 2016-09-10 DIAGNOSIS — R630 Anorexia: Secondary | ICD-10-CM

## 2016-09-10 DIAGNOSIS — G893 Neoplasm related pain (acute) (chronic): Secondary | ICD-10-CM

## 2016-09-10 DIAGNOSIS — R591 Generalized enlarged lymph nodes: Secondary | ICD-10-CM

## 2016-09-10 DIAGNOSIS — R1310 Dysphagia, unspecified: Secondary | ICD-10-CM

## 2016-09-10 DIAGNOSIS — E119 Type 2 diabetes mellitus without complications: Secondary | ICD-10-CM

## 2016-09-10 DIAGNOSIS — C7931 Secondary malignant neoplasm of brain: Secondary | ICD-10-CM

## 2016-09-10 DIAGNOSIS — Z923 Personal history of irradiation: Secondary | ICD-10-CM

## 2016-09-10 DIAGNOSIS — Z7984 Long term (current) use of oral hypoglycemic drugs: Secondary | ICD-10-CM

## 2016-09-10 DIAGNOSIS — Z87891 Personal history of nicotine dependence: Secondary | ICD-10-CM

## 2016-09-10 LAB — PREPARE RBC (CROSSMATCH)

## 2016-09-10 LAB — LACTATE DEHYDROGENASE: LDH: 935 U/L — ABNORMAL HIGH (ref 98–192)

## 2016-09-10 LAB — CBC WITH DIFFERENTIAL/PLATELET
Basophils Absolute: 0.1 10*3/uL (ref 0–0.1)
Basophils Relative: 1 %
EOS ABS: 0 10*3/uL (ref 0–0.7)
Eosinophils Relative: 0 %
HCT: 13.8 % — CL (ref 35.0–47.0)
HEMOGLOBIN: 4.8 g/dL — AB (ref 12.0–16.0)
LYMPHS ABS: 0.9 10*3/uL — AB (ref 1.0–3.6)
Lymphocytes Relative: 15 %
MCH: 35.8 pg — AB (ref 26.0–34.0)
MCHC: 34.5 g/dL (ref 32.0–36.0)
MCV: 103.7 fL — ABNORMAL HIGH (ref 80.0–100.0)
MONO ABS: 1.2 10*3/uL — AB (ref 0.2–0.9)
MONOS PCT: 19 %
NEUTROS PCT: 65 %
Neutro Abs: 4 10*3/uL (ref 1.4–6.5)
Platelets: 76 10*3/uL — ABNORMAL LOW (ref 150–440)
RBC: 1.33 MIL/uL — ABNORMAL LOW (ref 3.80–5.20)
RDW: 16.7 % — AB (ref 11.5–14.5)
WBC: 6.1 10*3/uL (ref 3.6–11.0)

## 2016-09-10 LAB — COMPREHENSIVE METABOLIC PANEL
ALK PHOS: 61 U/L (ref 38–126)
ALT: 11 U/L — ABNORMAL LOW (ref 14–54)
ANION GAP: 12 (ref 5–15)
AST: 17 U/L (ref 15–41)
Albumin: 3 g/dL — ABNORMAL LOW (ref 3.5–5.0)
BILIRUBIN TOTAL: 0.6 mg/dL (ref 0.3–1.2)
BUN: 12 mg/dL (ref 6–20)
CALCIUM: 8.6 mg/dL — AB (ref 8.9–10.3)
CO2: 22 mmol/L (ref 22–32)
Chloride: 99 mmol/L — ABNORMAL LOW (ref 101–111)
Creatinine, Ser: 0.9 mg/dL (ref 0.44–1.00)
GFR calc non Af Amer: >60 mL/min (ref >60)
Glucose, Bld: 107 mg/dL — ABNORMAL HIGH (ref 65–99)
Potassium: 3.3 mmol/L — ABNORMAL LOW (ref 3.5–5.1)
SODIUM: 133 mmol/L — AB (ref 135–145)
TOTAL PROTEIN: 7.2 g/dL (ref 6.5–8.1)

## 2016-09-10 LAB — SAMPLE TO BLOOD BANK

## 2016-09-10 LAB — ABO/RH: ABO/RH(D): O POS

## 2016-09-10 MED ORDER — DIPHENHYDRAMINE HCL 25 MG PO CAPS: 25.0000 mg | ORAL_CAPSULE | Freq: Once | ORAL | Status: DC

## 2016-09-10 MED ORDER — SODIUM CHLORIDE 0.9% FLUSH
10.0000 mL | INTRAVENOUS | Status: DC | PRN
  Filled 2016-09-10: qty 10

## 2016-09-10 MED ORDER — HEPARIN SOD (PORK) LOCK FLUSH 100 UNIT/ML IV SOLN
INTRAVENOUS | Status: AC
  Filled 2016-09-10: qty 5

## 2016-09-10 MED ORDER — HYDROCODONE-ACETAMINOPHEN 5-325 MG PO TABS: 1.0000 | ORAL_TABLET | Freq: Three times a day (TID) | ORAL | 0 refills | Status: DC | PRN

## 2016-09-10 MED ORDER — SODIUM CHLORIDE 0.9 % IV SOLN: Freq: Once | INTRAVENOUS | Status: DC

## 2016-09-10 MED ORDER — ACETAMINOPHEN 325 MG PO TABS: 650.0000 mg | ORAL_TABLET | Freq: Once | ORAL | Status: DC

## 2016-09-10 MED ORDER — SODIUM CHLORIDE 0.9% FLUSH
3.0000 mL | INTRAVENOUS | Status: DC | PRN
  Filled 2016-09-10: qty 3

## 2016-09-10 MED ORDER — HEPARIN SOD (PORK) LOCK FLUSH 100 UNIT/ML IV SOLN: 250.0000 [IU] | INTRAVENOUS | Status: DC | PRN

## 2016-09-10 MED ORDER — SODIUM CHLORIDE 0.9 % IV SOLN
250.0000 mL | Freq: Once | INTRAVENOUS | Status: DC
  Filled 2016-09-10: qty 250

## 2016-09-10 MED ORDER — HEPARIN SOD (PORK) LOCK FLUSH 100 UNIT/ML IV SOLN: 500.0000 [IU] | Freq: Every day | INTRAVENOUS | Status: DC | PRN

## 2016-09-10 MED ORDER — SODIUM CHLORIDE FLUSH 0.9 % IV SOLN
INTRAVENOUS | Status: AC
  Filled 2016-09-10: qty 10

## 2016-09-10 NOTE — Assessment & Plan Note (Addendum)
Recurrent small cell lung cancer- Status post cycle #4  carboplatin etoposide-Ct scan progressive disease.   # HOLD # 5 of carbo-etoposide today sec to progression/ severe anemia [hb4.8]  # neck adenopathy sec to disease progression- on RT. Discussed with Radiation; Dr.Crystal.   # Feeling poorly/fatigue- severe anemia- Hb 4.8- recommend 2 units of PRBC.   # pain sec to malignancy- recommend hydrocodone.   # cough sec to malignancy- robitussin OTC.   # repeat cbc/hold tube in 2 days; follow up with me labs/ hold 1 week.

## 2016-09-10 NOTE — Progress Notes (Signed)
Patient is here for follow up, she mentions she is fatigued, after her treatment on Tuesday she has no appetite,she feels very washed out and tired. She also complains of SOB when walking, when she coughs she feels a sharp pain in her head.

## 2016-09-10 NOTE — Progress Notes (Signed)
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 Diagnosis in January of 2017. Dx-  right supraclavicular lymph node biopsy.  # T2 N3 M1 stage IV disease with metastases to the brain, kidney, and adrenal gland.  # S/p WBRT radiation therapy. (Feb of 2017;]  Startingchemotherapy December 26, 2015 with carboplatinum and VP-16.  # July 12th 2017- Recurrence/ progression- on CT- Carbo-Etopside    # MOLECULAR TESTING- July 13th-[facility]- STABLE; [facility]- NOT enough tissue      Cancer of lower lobe of left lung ([place])   12/05/2015 Initial Diagnosis    Cancer of lower lobe of left lung ([place])       INTERVAL HISTORY:  Cynthia Carson 65 y.o.  female pleasant patient above history of recurrent extensive stage small cell lung cancer is here for follow-up. Patient is status post cycle #3 appx 4 weeks ago. Patient currently undergoing radiation right side of the neck.  She complains of extreme fatigue. Complains of extreme shortness of breath with exertion. Poor appetite. She feels poorly. No hemoptysis. Denies any chest pain. Positive for nausea no vomiting.  REVIEW OF SYSTEMS:  A complete 10 point review of system is done which is negative except mentioned above/history of present illness.   PAST MEDICAL HISTORY :  Past Medical History:  Diagnosis Date  . Cancer of lung ([place]) 12/05/2015  . Diabetes mellitus without complication ([place])     PAST SURGICAL HISTORY :   Past Surgical History:  Procedure Laterality Date  . ABDOMINAL HYSTERECTOMY    . PERIPHERAL VASCULAR CATHETERIZATION N/A 01/13/2016    Procedure: Glori Luis Cath Insertion;  Surgeon: Katha Cabal, MD;  Location: [place] [facility];  Service: Cardiovascular;  Laterality: N/A;    FAMILY HISTORY :  No family history on file.  SOCIAL HISTORY:   Social History  Substance Use Topics  . Smoking status: Former Smoker    Packs/day: 1.00    Years: 49.00    Types: Cigarettes    Quit date: 11/19/2011  . Smokeless tobacco: Never Used  . Alcohol use No    ALLERGIES:  is allergic to amoxicillin and levofloxacin.  MEDICATIONS:  Current Outpatient Prescriptions  Medication Sig Dispense Refill  . ALPRAZolam (XANAX) 0.5 MG tablet Take 0.5 mg by mouth 3 (three) times daily as needed for anxiety.     . benzonatate (TESSALON) 200 MG capsule Take 1 capsule (200 mg total) by mouth 3 (three) times daily as needed for cough. 30 capsule 0  . Calcium Carbonate-Vitamin D (CALCIUM-VITAMIN D) 500-200 MG-UNIT tablet Take 1 tablet by mouth daily.    Marland Kitchen glimepiride (AMARYL) 2 MG tablet Take 2 mg by mouth daily with breakfast.     . KLOR-CON M20 20 MEQ tablet TAKE ONE TABLET BY MOUTH DAILY 30 tablet 3  . latanoprost (XALATAN) 0.005 % ophthalmic solution Place 1 drop into both eyes at bedtime.    . lidocaine-prilocaine (EMLA) cream Apply 1 application topically as needed. 30 g 3  . magic mouthwash w/lidocaine SOLN Take 5 mLs by mouth 4 (four) times daily. 80 ml viscous lidocaine 2%,  80 ml Mylanta, 80 ml Diphenhydramine 12.5 mg/5 ml Elixir, 80 ml Nystatin 100,000 Unit suspension, 80 ml Prednisolone 15 mg/7m, 80 ml Distilled Water. Sig: Swish/Swallow 5-10 ml four times a day as needed. Dispense 480 ml. 3RFs (Patient not taking: Reported on 09/12/2016) 480 mL 0  . omeprazole (PRILOSEC) 20 MG capsule Take 20 mg by mouth daily.     . prednisoLONE (PRELONE) 15 MG/5ML syrup     . prochlorperazine (COMPAZINE) 10 MG tablet Take 1 tablet (10 mg total) by mouth every 6 (six) hours as needed for nausea or vomiting. 30 tablet 3  . sitaGLIPtin (JANUVIA) 100  MG tablet Take 100 mg by mouth daily.    .Marland KitchenHYDROcodone-acetaminophen (NORCO/VICODIN) 5-325 MG tablet Take 1 tablet by mouth every 8 (eight) hours as needed for moderate pain. 30 tablet 0   Current Facility-Administered Medications  Medication Dose Route Frequency Provider Last Rate Last Dose  . 0.9 %  sodium chloride infusion  250 mL Intravenous Once GCammie Sickle MD      . acetaminophen (TYLENOL) tablet 650 mg  650 mg Oral Once GCammie Sickle MD      . diphenhydrAMINE (BENADRYL) capsule 25 mg  25 mg Oral Once GCammie Sickle MD      . heparin lock flush 100 unit/mL  500 Units Intracatheter Daily PRN GCammie Sickle MD      . heparin lock flush 100 unit/mL  250 Units Intracatheter PRN GCammie Sickle MD      . sodium chloride flush (NS) 0.9 % injection 10 mL  10 mL Intracatheter PRN GCammie Sickle MD      . sodium chloride flush (NS) 0.9 % injection 3 mL  3 mL Intracatheter PRN GCammie Sickle MD       Facility-Administered Medications Ordered in Other Visits  Medication Dose Route Frequency Provider Last Rate Last Dose  . alteplase (CATHFLO ACTIVASE) injection 2 mg  2 mg Intracatheter Once PRN JForest Gleason MD      . heparin lock flush 100 unit/mL  500 Units Intravenous Once JForest Gleason MD      . heparin lock flush 100 unit/mL  500 Units Intracatheter Once PRN JForest Gleason MD      . heparin lock flush 100 unit/mL  250 Units Intracatheter Once PRN JForest Gleason MD      . sodium chloride 0.9 % injection 10 mL  10 mL Intracatheter PRN JForest Gleason MD      . sodium chloride flush (NS) 0.9 % injection 10 mL  10 mL Intravenous PRN JForest Gleason MD      . sodium chloride flush (NS) 0.9 % injection 10 mL  10 mL Intravenous PRN JForest Gleason MD   10 mL at 02/01/16 1439    PHYSICAL EXAMINATION: ECOG PERFORMANCE STATUS: 1 - Symptomatic but completely ambulatory  BP 96/62 (BP Location: Left Arm, Patient Position: Sitting)   Pulse (!) 106   Temp 99.3  F (37.4 C) (Tympanic)   Resp 18   Wt 131 lb 6.4 oz (59.6 kg)   BMI 21.87 kg/m   Filed Weights   09/10/16 0941  Weight: 131 lb 6.4 oz (59.6 kg)    GENERAL: Thin built well-developed; Alert, no distress and comfortable. She is alone.  EYES: no pallor or icterus OROPHARYNX: no thrush or ulceration; good dentition  NECK: supple, no masses felt; tenderness on the right side of neck.  LYMPH: 2-3 cm Right supraclavicular adenopathy No axillary or inguinal regions  LUNGS: Decreased results of the left lower base No wheeze or crackles HEART/CVS: regular rate & rhythm and no murmurs; No lower extremity edema ABDOMEN:abdomen soft, non-tender and normal bowel sounds Musculoskeletal:no cyanosis of digits and no clubbing  PSYCH: alert & oriented x 3 with fluent speech NEURO: no focal motor/sensory deficits SKIN:  no rashes or significant lesions  LABORATORY DATA:  I have reviewed the data as listed    Component Value Date/Time   NA 133 (L) 09/12/2016 0801   K 3.2 (L) 09/12/2016 0801   CL 99 (L) 09/12/2016 0801   CO2 23 09/12/2016 0801   GLUCOSE 153 (H) 09/12/2016 0801   BUN 14 09/12/2016 0801   CREATININE 0.92 09/12/2016 0801   CALCIUM 8.4 (L) 09/12/2016 0801   PROT 6.6 09/12/2016 0801   ALBUMIN 2.7 (L) 09/12/2016 0801   AST 18 09/12/2016 0801   ALT 11 (L) 09/12/2016 0801   ALKPHOS 58 09/12/2016 0801   BILITOT 0.7 09/12/2016 0801   GFRNONAA >60 09/12/2016 0801   GFRAA >60 09/12/2016 0801    No results found for: SPEP, UPEP  Lab Results  Component Value Date   WBC 5.5 09/12/2016   NEUTROABS 3.7 09/12/2016   HGB 6.9 (L) 09/12/2016   HCT 19.8 (L) 09/12/2016   MCV 91.6 09/12/2016   PLT 89 (L) 09/12/2016      Chemistry      Component Value Date/Time   NA 133 (L) 09/12/2016 0801   K 3.2 (L) 09/12/2016 0801   CL 99 (L) 09/12/2016 0801   CO2 23 09/12/2016 0801   BUN 14 09/12/2016 0801   CREATININE 0.92 09/12/2016 0801      Component Value Date/Time   CALCIUM 8.4 (L)  09/12/2016 0801   ALKPHOS 58 09/12/2016 0801   AST 18 09/12/2016 0801   ALT 11 (L) 09/12/2016 0801   BILITOT 0.7 09/12/2016 0801       RADIOGRAPHIC STUDIES: I have personally reviewed the radiological images as listed and agreed with the findings in the report. No results found.   ASSESSMENT & PLAN:  Cancer of lower lobe of left lung (HCC) Recurrent small cell lung cancer- Status post cycle #4  carboplatin etoposide-Ct scan progressive disease.   # HOLD # 5 of carbo-etoposide today sec to progression/ severe anemia [hb4.8]  # neck adenopathy sec to disease progression- on RT. Discussed with Radiation; Dr.Crystal.   # Feeling poorly/fatigue- severe anemia- Hb 4.8- recommend 2 units of PRBC.   # pain sec to malignancy- recommend hydrocodone.   # cough sec to malignancy- robitussin OTC.   # repeat cbc/hold tube in 2 days; follow up with me labs/ hold  Orders Placed This Encounter  Procedures  . Practitioner attestation of consent    I, the ordering practitioner, attest that I have discussed with the patient the benefits, risks, side effects, alternatives, likelihood of achieving goals and potential problems during recovery for the procedure listed.    Standing Status:   Future    Standing Expiration Date:   09/10/2017    Order Specific Question:   Procedure    Answer:   Blood Product(s)  . Complete patient signature process for consent form    Standing Status:   Future    Standing Expiration Date:   09/10/2017  . Care order/instruction    Transfuse Parameters    Standing Status:   Future    Standing Expiration Date:   09/10/2017  . Type and screen    Standing Status:  Future    Number of Occurrences:   1    Standing Expiration Date:   09/10/2017  . Prepare RBC    Standing Status:   Standing    Number of Occurrences:   1    Order Specific Question:   # of Units    Answer:   2 units    Order Specific Question:   Transfusion Indications    Answer:   Symptomatic Anemia     Order Specific Question:   If emergent release call blood bank    Answer:   Not emergent release   All questions were answered. The patient knows to call the clinic with any problems, questions or concerns.      Cammie Sickle, MD 09/13/2016 5:37 PM

## 2016-09-10 NOTE — Progress Notes (Signed)
Critical Hgb and hematocrit called.  MD made aware immediately saw in clinic and set up for blood transfusion

## 2016-09-11 ENCOUNTER — Inpatient Hospital Stay: Payer: BLUE CROSS/BLUE SHIELD

## 2016-09-11 ENCOUNTER — Ambulatory Visit: Payer: BLUE CROSS/BLUE SHIELD

## 2016-09-11 ENCOUNTER — Telehealth: Payer: Self-pay | Admitting: *Deleted

## 2016-09-11 DIAGNOSIS — C3432 Malignant neoplasm of lower lobe, left bronchus or lung: Secondary | ICD-10-CM

## 2016-09-11 MED ORDER — HYDROCODONE-ACETAMINOPHEN 5-325 MG PO TABS: 1.0000 | ORAL_TABLET | Freq: Three times a day (TID) | ORAL | 0 refills | Status: DC | PRN

## 2016-09-11 NOTE — Progress Notes (Signed)
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 Diagnosis in January of 2017. Dx-  right supraclavicular lymph node biopsy.  # T2 N3 M1 stage IV disease with metastases to the brain, kidney, and adrenal gland.  # S/p WBRT radiation therapy. (Feb of 2017;]  Startingchemotherapy December 26, 2015 with carboplatinum and VP-16.  # July 12th 2017- Recurrence/ progression- on CT- Carbo-Etopside    # MOLECULAR TESTING- July 13th-[facility]- STABLE; [facility]- NOT enough tissue      Cancer of lower lobe of left lung ([place])   12/05/2015 Initial Diagnosis    Cancer of lower lobe of left lung ([place])       INTERVAL HISTORY:  Cynthia Carson 65 y.o.  female pleasant patient above history of recurrent extensive stage small cell lung cancer is here for follow-up. Patient is status post cycle #3 on October 2-4. She is seen in clinic today as an add-on for further evaluation and discussion of her laboratory results. She continues to have a chronic cough. She also has shortness of breath that is unchanged. She has persistent weakness and fatigue and is scheduled for blood transfusion today. She continues to have difficulty swallowing. She offers no further specific complaints.  REVIEW OF SYSTEMS:  A complete 10 point review of system is done which is negative except mentioned above/history of present illness.   PAST MEDICAL HISTORY :  Past Medical History:  Diagnosis Date  . Cancer of lung ([place]) 12/05/2015  . Diabetes mellitus without complication ([place])     PAST SURGICAL HISTORY :   Past Surgical History:   Procedure Laterality Date  . ABDOMINAL HYSTERECTOMY    . PERIPHERAL VASCULAR CATHETERIZATION N/A 01/13/2016   Procedure: Glori Luis Cath Insertion;  Surgeon: Katha Cabal, MD;  Location: [place] [facility];  Service: Cardiovascular;  Laterality: N/A;    FAMILY HISTORY :  History reviewed. No pertinent family history. Reviewed and unchanged. No reported history of malignancy or chronic disease.  SOCIAL HISTORY:   Social History  Substance Use Topics  . Smoking status: Former Smoker    Packs/day: 1.00    Years: 49.00    Types: Cigarettes    Quit date: 11/19/2011  . Smokeless tobacco: Never Used  . Alcohol use No    ALLERGIES:  is allergic to amoxicillin and levofloxacin.  MEDICATIONS:  Current Outpatient Prescriptions  Medication Sig Dispense Refill  . ALPRAZolam (XANAX) 0.5 MG tablet Take 0.5 mg by mouth 3 (three) times daily as needed for anxiety.     . benzonatate (TESSALON) 200 MG capsule Take 1 capsule (200 mg total) by mouth 3 (three) times daily as needed for cough. 30 capsule 0  . Calcium Carbonate-Vitamin D (CALCIUM-VITAMIN D) 500-200 MG-UNIT tablet Take 1 tablet by mouth daily.    Marland Kitchen glimepiride (AMARYL) 2 MG tablet Take 2 mg by mouth daily with breakfast.     . HYDROcodone-acetaminophen (NORCO/VICODIN) 5-325 MG tablet Take 1 tablet by mouth every 8 (eight) hours as needed for moderate pain. 30 tablet 0  . KLOR-CON M20 20 MEQ tablet TAKE ONE TABLET BY MOUTH DAILY  30 tablet 3  . latanoprost (XALATAN) 0.005 % ophthalmic solution Place 1 drop into both eyes at bedtime.    . lidocaine-prilocaine (EMLA) cream Apply 1 application topically as needed. 30 g 3  . omeprazole (PRILOSEC) 20 MG capsule Take 20 mg by mouth daily.     . prednisoLONE (PRELONE) 15 MG/5ML syrup     . prochlorperazine (COMPAZINE) 10 MG tablet Take 1 tablet (10 mg total) by mouth every 6 (six) hours as needed for nausea or vomiting. 30 tablet 3  . sitaGLIPtin (JANUVIA) 100 MG tablet Take 100 mg by mouth  daily.    . magic mouthwash w/lidocaine SOLN Take 5 mLs by mouth 4 (four) times daily. 80 ml viscous lidocaine 2%, 80 ml Mylanta, 80 ml Diphenhydramine 12.5 mg/5 ml Elixir, 80 ml Nystatin 100,000 Unit suspension, 80 ml Prednisolone 15 mg/67m, 80 ml Distilled Water. Sig: Swish/Swallow 5-10 ml four times a day as needed. Dispense 480 ml. 3RFs (Patient not taking: Reported on 09/12/2016) 480 mL 0   No current facility-administered medications for this visit.    Facility-Administered Medications Ordered in Other Visits  Medication Dose Route Frequency Provider Last Rate Last Dose  . alteplase (CATHFLO ACTIVASE) injection 2 mg  2 mg Intracatheter Once PRN JForest Gleason MD      . heparin lock flush 100 unit/mL  500 Units Intravenous Once JForest Gleason MD      . heparin lock flush 100 unit/mL  500 Units Intracatheter Once PRN JForest Gleason MD      . heparin lock flush 100 unit/mL  250 Units Intracatheter Once PRN JForest Gleason MD      . sodium chloride 0.9 % injection 10 mL  10 mL Intracatheter PRN JForest Gleason MD      . sodium chloride flush (NS) 0.9 % injection 10 mL  10 mL Intravenous PRN JForest Gleason MD      . sodium chloride flush (NS) 0.9 % injection 10 mL  10 mL Intravenous PRN JForest Gleason MD   10 mL at 02/01/16 1439    PHYSICAL EXAMINATION: ECOG PERFORMANCE STATUS: 1 - Symptomatic but completely ambulatory  BP (!) 88/59 (BP Location: Left Arm, Patient Position: Sitting)   Pulse 99   Temp (!) 95.8 F (35.4 C) (Tympanic)   Resp 17   Ht 5' 5"  (1.651 m)   Wt 130 lb 3.2 oz (59.1 kg)   SpO2 96%   BMI 21.67 kg/m   Filed Weights   09/12/16 0819  Weight: 130 lb 3.2 oz (59.1 kg)    GENERAL: Thin built well-developed; Alert, no distress and comfortable. She is alone.  EYES: no pallor or icterus OROPHARYNX: no thrush or ulceration; good dentition  NECK: supple, no masses felt; tenderness on the right side of neck.  LYMPH: 2-3 cm Right supraclavicular adenopathy No axillary or  inguinal regions LUNGS: Decreased results of the left lower base No wheeze or crackles HEART/CVS: regular rate & rhythm and no murmurs; No lower extremity edema ABDOMEN:abdomen soft, non-tender and normal bowel sounds Musculoskeletal:no cyanosis of digits and no clubbing  PSYCH: alert & oriented x 3 with fluent speech NEURO: no focal motor/sensory deficits SKIN:  no rashes or significant lesions  LABORATORY DATA:  I have reviewed the data as listed    Component Value Date/Time   NA 133 (L) 09/12/2016 0801   K 3.2 (L) 09/12/2016 0801   CL 99 (L) 09/12/2016 0801   CO2 23 09/12/2016 0801   GLUCOSE 153 (H) 09/12/2016 0801  BUN 14 09/12/2016 0801   CREATININE 0.92 09/12/2016 0801   CALCIUM 8.4 (L) 09/12/2016 0801   PROT 6.6 09/12/2016 0801   ALBUMIN 2.7 (L) 09/12/2016 0801   AST 18 09/12/2016 0801   ALT 11 (L) 09/12/2016 0801   ALKPHOS 58 09/12/2016 0801   BILITOT 0.7 09/12/2016 0801   GFRNONAA >60 09/12/2016 0801   GFRAA >60 09/12/2016 0801    No results found for: SPEP, UPEP  Lab Results  Component Value Date   WBC 5.5 09/12/2016   NEUTROABS 3.7 09/12/2016   HGB 6.9 (L) 09/12/2016   HCT 19.8 (L) 09/12/2016   MCV 91.6 09/12/2016   PLT 89 (L) 09/12/2016      Chemistry      Component Value Date/Time   NA 133 (L) 09/12/2016 0801   K 3.2 (L) 09/12/2016 0801   CL 99 (L) 09/12/2016 0801   CO2 23 09/12/2016 0801   BUN 14 09/12/2016 0801   CREATININE 0.92 09/12/2016 0801      Component Value Date/Time   CALCIUM 8.4 (L) 09/12/2016 0801   ALKPHOS 58 09/12/2016 0801   AST 18 09/12/2016 0801   ALT 11 (L) 09/12/2016 0801   BILITOT 0.7 09/12/2016 0801       RADIOGRAPHIC STUDIES: I have personally reviewed the radiological images as listed and agreed with the findings in the report. No results found.   ASSESSMENT & PLAN:   Recurrent small cell lung cancer- Status post cycle #4  carboplatin etoposide-Ct scan progressive disease.   # HOLD # 5 of carbo-etoposide  today sec to progression/ severe anemia   # neck adenopathy sec to disease progression- Continue XRT.  # Feeling poorly/fatigue- severe anemia- hemoglobin improved, but patient still significantly symptomatic. Proceed with 2 units packed red blood cells today as planned.   # pain sec to malignancy- continue hydrocodone as needed.  # cough sec to malignancy- robitussin OTC.   # Return to clinic on September 19, 2016 for further evaluation, consideration of additional blood and possible chemotherapy.   Orders Placed This Encounter  Procedures  . Prepare RBC    Standing Status:   Standing    Number of Occurrences:   1    Order Specific Question:   # of Units    Answer:   2 units    Order Specific Question:   Transfusion Indications    Answer:   Symptomatic Anemia    Order Specific Question:   If emergent release call blood bank    Answer:   Not emergent release   All questions were answered. The patient knows to call the clinic with any problems, questions or concerns.      Lloyd Huger, MD 09/16/2016 10:53 AM

## 2016-09-11 NOTE — Telephone Encounter (Signed)
Family concerned because she is not making sense. Concerned because she had to have blood yesterday. I explained to her that she has disease progression and is being treated and that one of the SE of tx is bone marrow suppression, I told her that she has an appt in the morning and if she could arrange it, she could come with her to her appt in the morning and speak with the doctor since her mother is not making sense and not telling her everything. She said she will try to arrange her schedule to do that.

## 2016-09-12 ENCOUNTER — Other Ambulatory Visit: Payer: Self-pay

## 2016-09-12 ENCOUNTER — Ambulatory Visit: Payer: BLUE CROSS/BLUE SHIELD

## 2016-09-12 ENCOUNTER — Inpatient Hospital Stay (HOSPITAL_BASED_OUTPATIENT_CLINIC_OR_DEPARTMENT_OTHER): Payer: BLUE CROSS/BLUE SHIELD | Admitting: Oncology

## 2016-09-12 ENCOUNTER — Encounter: Payer: Self-pay | Admitting: Oncology

## 2016-09-12 ENCOUNTER — Ambulatory Visit: Payer: BLUE CROSS/BLUE SHIELD | Admitting: Oncology

## 2016-09-12 ENCOUNTER — Inpatient Hospital Stay: Payer: BLUE CROSS/BLUE SHIELD

## 2016-09-12 VITALS — BP 88/59 | HR 99 | Temp 95.8°F | Resp 17 | Ht 65.0 in | Wt 130.2 lb

## 2016-09-12 DIAGNOSIS — G893 Neoplasm related pain (acute) (chronic): Secondary | ICD-10-CM

## 2016-09-12 DIAGNOSIS — Z923 Personal history of irradiation: Secondary | ICD-10-CM

## 2016-09-12 DIAGNOSIS — R05 Cough: Secondary | ICD-10-CM

## 2016-09-12 DIAGNOSIS — C3432 Malignant neoplasm of lower lobe, left bronchus or lung: Secondary | ICD-10-CM

## 2016-09-12 DIAGNOSIS — R1310 Dysphagia, unspecified: Secondary | ICD-10-CM

## 2016-09-12 DIAGNOSIS — R630 Anorexia: Secondary | ICD-10-CM

## 2016-09-12 DIAGNOSIS — Z7984 Long term (current) use of oral hypoglycemic drugs: Secondary | ICD-10-CM

## 2016-09-12 DIAGNOSIS — R5383 Other fatigue: Secondary | ICD-10-CM

## 2016-09-12 DIAGNOSIS — R591 Generalized enlarged lymph nodes: Secondary | ICD-10-CM

## 2016-09-12 DIAGNOSIS — E119 Type 2 diabetes mellitus without complications: Secondary | ICD-10-CM

## 2016-09-12 DIAGNOSIS — Z9221 Personal history of antineoplastic chemotherapy: Secondary | ICD-10-CM

## 2016-09-12 DIAGNOSIS — D6481 Anemia due to antineoplastic chemotherapy: Secondary | ICD-10-CM

## 2016-09-12 DIAGNOSIS — Z87891 Personal history of nicotine dependence: Secondary | ICD-10-CM

## 2016-09-12 DIAGNOSIS — C7970 Secondary malignant neoplasm of unspecified adrenal gland: Secondary | ICD-10-CM | POA: Diagnosis not present

## 2016-09-12 DIAGNOSIS — C7931 Secondary malignant neoplasm of brain: Secondary | ICD-10-CM

## 2016-09-12 DIAGNOSIS — R0602 Shortness of breath: Secondary | ICD-10-CM

## 2016-09-12 DIAGNOSIS — Z79899 Other long term (current) drug therapy: Secondary | ICD-10-CM

## 2016-09-12 LAB — CBC WITH DIFFERENTIAL/PLATELET
BASOS ABS: 0 10*3/uL (ref 0–0.1)
Basophils Relative: 0 %
EOS ABS: 0 10*3/uL (ref 0–0.7)
EOS PCT: 0 %
HCT: 19.8 % — ABNORMAL LOW (ref 35.0–47.0)
Hemoglobin: 6.9 g/dL — ABNORMAL LOW (ref 12.0–16.0)
Lymphocytes Relative: 14 %
Lymphs Abs: 0.8 10*3/uL — ABNORMAL LOW (ref 1.0–3.6)
MCH: 32 pg (ref 26.0–34.0)
MCHC: 35 g/dL (ref 32.0–36.0)
MCV: 91.6 fL (ref 78.0–100.0)
MONO ABS: 1 10*3/uL — AB (ref 0.2–0.9)
Monocytes Relative: 19 %
Neutro Abs: 3.7 10*3/uL (ref 1.4–6.5)
Neutrophils Relative %: 67 %
PLATELETS: 89 10*3/uL — AB (ref 150–440)
RBC: 2.16 MIL/uL — AB (ref 3.80–5.20)
RDW: 25 % — AB (ref 11.5–14.5)
WBC: 5.5 10*3/uL (ref 3.6–11.0)

## 2016-09-12 LAB — COMPREHENSIVE METABOLIC PANEL
ALT: 11 U/L — AB (ref 14–54)
AST: 18 U/L (ref 15–41)
Albumin: 2.7 g/dL — ABNORMAL LOW (ref 3.5–5.0)
Alkaline Phosphatase: 58 U/L (ref 38–126)
Anion gap: 11 (ref 5–15)
BUN: 14 mg/dL (ref 6–20)
CHLORIDE: 99 mmol/L — AB (ref 101–111)
CO2: 23 mmol/L (ref 22–32)
CREATININE: 0.92 mg/dL (ref 0.44–1.00)
Calcium: 8.4 mg/dL — ABNORMAL LOW (ref 8.9–10.3)
GFR calc non Af Amer: >60 mL/min (ref >60)
Glucose, Bld: 153 mg/dL — ABNORMAL HIGH (ref 65–99)
POTASSIUM: 3.2 mmol/L — AB (ref 3.5–5.1)
SODIUM: 133 mmol/L — AB (ref 135–145)
Total Bilirubin: 0.7 mg/dL (ref 0.3–1.2)
Total Protein: 6.6 g/dL (ref 6.5–8.1)

## 2016-09-12 LAB — PREPARE RBC (CROSSMATCH)

## 2016-09-12 MED ORDER — SODIUM CHLORIDE 0.9% FLUSH
10.0000 mL | INTRAVENOUS | Status: AC | PRN
  Administered 2016-09-12: 10 mL
  Filled 2016-09-12: qty 10

## 2016-09-12 MED ORDER — DIPHENHYDRAMINE HCL 50 MG/ML IJ SOLN
25.0000 mg | Freq: Once | INTRAMUSCULAR | Status: AC
  Administered 2016-09-12: 25 mg via INTRAVENOUS
  Filled 2016-09-12: qty 1

## 2016-09-12 MED ORDER — SODIUM CHLORIDE 0.9 % IV SOLN
250.0000 mL | Freq: Once | INTRAVENOUS | Status: AC
  Administered 2016-09-12: 250 mL via INTRAVENOUS
  Filled 2016-09-12: qty 250

## 2016-09-12 MED ORDER — ACETAMINOPHEN 325 MG PO TABS
650.0000 mg | ORAL_TABLET | Freq: Once | ORAL | Status: AC
  Administered 2016-09-12: 650 mg via ORAL
  Filled 2016-09-12: qty 2

## 2016-09-12 MED ORDER — HEPARIN SOD (PORK) LOCK FLUSH 100 UNIT/ML IV SOLN
500.0000 [IU] | Freq: Every day | INTRAVENOUS | Status: AC | PRN
  Administered 2016-09-12: 500 [IU]
  Filled 2016-09-12 (×2): qty 5

## 2016-09-12 NOTE — Progress Notes (Signed)
Main concern today is cough, headache, and labs

## 2016-09-13 ENCOUNTER — Inpatient Hospital Stay: Payer: BLUE CROSS/BLUE SHIELD

## 2016-09-13 ENCOUNTER — Ambulatory Visit: Payer: BLUE CROSS/BLUE SHIELD

## 2016-09-13 LAB — TYPE AND SCREEN
ABO/RH(D): O POS
Antibody Screen: NEGATIVE
UNIT DIVISION: 0
Unit division: 0
Unit division: 0
Unit division: 0

## 2016-09-14 ENCOUNTER — Ambulatory Visit: Payer: BLUE CROSS/BLUE SHIELD

## 2016-09-14 ENCOUNTER — Inpatient Hospital Stay: Payer: BLUE CROSS/BLUE SHIELD

## 2016-09-17 ENCOUNTER — Ambulatory Visit: Payer: BLUE CROSS/BLUE SHIELD

## 2016-09-18 ENCOUNTER — Ambulatory Visit
Admission: RE | Admit: 2016-09-18 | Discharge: 2016-09-18 | Disposition: A | Discharge status: Home / Self Care | Payer: BLUE CROSS/BLUE SHIELD | Source: Ambulatory Visit | Attending: Radiation Oncology | Admitting: Radiation Oncology

## 2016-09-18 ENCOUNTER — Ambulatory Visit: Payer: BLUE CROSS/BLUE SHIELD

## 2016-09-18 DIAGNOSIS — R590 Localized enlarged lymph nodes: Secondary | ICD-10-CM | POA: Diagnosis not present

## 2016-09-19 ENCOUNTER — Ambulatory Visit
Admission: RE | Admit: 2016-09-19 | Discharge: 2016-09-19 | Disposition: A | Discharge status: Home / Self Care | Payer: BLUE CROSS/BLUE SHIELD | Source: Ambulatory Visit | Attending: Radiation Oncology | Admitting: Radiation Oncology

## 2016-09-19 ENCOUNTER — Other Ambulatory Visit: Payer: BLUE CROSS/BLUE SHIELD

## 2016-09-19 ENCOUNTER — Inpatient Hospital Stay: Payer: BLUE CROSS/BLUE SHIELD

## 2016-09-19 ENCOUNTER — Inpatient Hospital Stay (HOSPITAL_BASED_OUTPATIENT_CLINIC_OR_DEPARTMENT_OTHER): Payer: BLUE CROSS/BLUE SHIELD | Admitting: Internal Medicine

## 2016-09-19 ENCOUNTER — Inpatient Hospital Stay: Payer: BLUE CROSS/BLUE SHIELD | Attending: Internal Medicine

## 2016-09-19 ENCOUNTER — Ambulatory Visit: Payer: BLUE CROSS/BLUE SHIELD

## 2016-09-19 ENCOUNTER — Ambulatory Visit: Payer: BLUE CROSS/BLUE SHIELD | Admitting: Internal Medicine

## 2016-09-19 VITALS — BP 103/71 | HR 88 | Temp 97.5°F | Resp 20 | Ht 65.0 in | Wt 128.1 lb

## 2016-09-19 DIAGNOSIS — Z7984 Long term (current) use of oral hypoglycemic drugs: Secondary | ICD-10-CM | POA: Diagnosis not present

## 2016-09-19 DIAGNOSIS — C7931 Secondary malignant neoplasm of brain: Secondary | ICD-10-CM

## 2016-09-19 DIAGNOSIS — E119 Type 2 diabetes mellitus without complications: Secondary | ICD-10-CM | POA: Insufficient documentation

## 2016-09-19 DIAGNOSIS — R110 Nausea: Secondary | ICD-10-CM | POA: Diagnosis not present

## 2016-09-19 DIAGNOSIS — C7900 Secondary malignant neoplasm of unspecified kidney and renal pelvis: Secondary | ICD-10-CM | POA: Insufficient documentation

## 2016-09-19 DIAGNOSIS — D6481 Anemia due to antineoplastic chemotherapy: Secondary | ICD-10-CM | POA: Diagnosis not present

## 2016-09-19 DIAGNOSIS — Z5111 Encounter for antineoplastic chemotherapy: Secondary | ICD-10-CM | POA: Diagnosis not present

## 2016-09-19 DIAGNOSIS — Z9221 Personal history of antineoplastic chemotherapy: Secondary | ICD-10-CM

## 2016-09-19 DIAGNOSIS — R05 Cough: Secondary | ICD-10-CM

## 2016-09-19 DIAGNOSIS — C7970 Secondary malignant neoplasm of unspecified adrenal gland: Secondary | ICD-10-CM

## 2016-09-19 DIAGNOSIS — R590 Localized enlarged lymph nodes: Secondary | ICD-10-CM | POA: Insufficient documentation

## 2016-09-19 DIAGNOSIS — Z79899 Other long term (current) drug therapy: Secondary | ICD-10-CM | POA: Diagnosis not present

## 2016-09-19 DIAGNOSIS — G893 Neoplasm related pain (acute) (chronic): Secondary | ICD-10-CM | POA: Diagnosis not present

## 2016-09-19 DIAGNOSIS — R5383 Other fatigue: Secondary | ICD-10-CM

## 2016-09-19 DIAGNOSIS — Z87891 Personal history of nicotine dependence: Secondary | ICD-10-CM | POA: Diagnosis not present

## 2016-09-19 DIAGNOSIS — Z923 Personal history of irradiation: Secondary | ICD-10-CM | POA: Diagnosis not present

## 2016-09-19 DIAGNOSIS — C3432 Malignant neoplasm of lower lobe, left bronchus or lung: Secondary | ICD-10-CM | POA: Insufficient documentation

## 2016-09-19 LAB — COMPREHENSIVE METABOLIC PANEL
ALBUMIN: 3.2 g/dL — AB (ref 3.5–5.0)
ALT: 11 U/L — ABNORMAL LOW (ref 14–54)
AST: 18 U/L (ref 15–41)
Alkaline Phosphatase: 70 U/L (ref 38–126)
Anion gap: 11 (ref 5–15)
BILIRUBIN TOTAL: 0.3 mg/dL (ref 0.3–1.2)
BUN: 9 mg/dL (ref 6–20)
CO2: 24 mmol/L (ref 22–32)
Calcium: 9.1 mg/dL (ref 8.9–10.3)
Chloride: 101 mmol/L (ref 101–111)
Creatinine, Ser: 0.84 mg/dL (ref 0.44–1.00)
GFR calc Af Amer: >60 mL/min (ref >60)
GFR calc non Af Amer: >60 mL/min (ref >60)
GLUCOSE: 162 mg/dL — AB (ref 65–99)
POTASSIUM: 3.8 mmol/L (ref 3.5–5.1)
SODIUM: 136 mmol/L (ref 135–145)
TOTAL PROTEIN: 7.6 g/dL (ref 6.5–8.1)

## 2016-09-19 LAB — CBC WITH DIFFERENTIAL/PLATELET
BASOS ABS: 0 10*3/uL (ref 0–0.1)
BASOS PCT: 1 %
EOS ABS: 0 10*3/uL (ref 0–0.7)
Eosinophils Relative: 0 %
HEMATOCRIT: 33.1 % — AB (ref 35.0–47.0)
HEMOGLOBIN: 11.3 g/dL — AB (ref 12.0–16.0)
Lymphocytes Relative: 17 %
Lymphs Abs: 0.9 10*3/uL — ABNORMAL LOW (ref 1.0–3.6)
MCH: 31.5 pg (ref 26.0–34.0)
MCHC: 34.1 g/dL (ref 32.0–36.0)
MCV: 92.4 fL (ref 80.0–100.0)
MONO ABS: 0.8 10*3/uL (ref 0.2–0.9)
Monocytes Relative: 17 %
NEUTROS ABS: 3.3 10*3/uL (ref 1.4–6.5)
NEUTROS PCT: 65 %
Platelets: 204 10*3/uL (ref 150–440)
RBC: 3.58 MIL/uL — ABNORMAL LOW (ref 3.80–5.20)
RDW: 18.8 % — AB (ref 11.5–14.5)
WBC: 5 10*3/uL (ref 3.6–11.0)

## 2016-09-19 LAB — SAMPLE TO BLOOD BANK

## 2016-09-19 NOTE — Progress Notes (Signed)
Patient reports "a slight improvement with difficulty swallowing."   Reports interrupted sleep processes r/t her anxiety.  She is not taking her alprazolam on a consistent basis.  Discussed the role therapeutic bedtime sleep habits. Encouraged the use of

## 2016-09-19 NOTE — Assessment & Plan Note (Addendum)
Recurrent small cell lung cancer- Status post cycle #4  carboplatin etoposide-Ct scan progressive disease/ platinum refractory. Discussed re: Topotecan as next line of therapy. Discussed palliative nature treatments. Also discussed that the response rates are very modest.  # Anemia- severe [4.8]-sec to carboplatin. s/p 4 units of PRBC. Hb-today 11.   # neck adenopathy sec to disease progression- on RT; Dr.Crystal. RT until Nov 9th.   # pain sec to malignancy- improved on hydrocodone.   # follow up on 13th Nov re: Topotecan day 1-5 q/labs.encouraged to bring family.

## 2016-09-19 NOTE — Progress Notes (Signed)
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 Diagnosis in January of 2017. Dx-  right supraclavicular lymph node biopsy.  # T2 N3 M1 stage IV disease with metastases to the brain, kidney, and adrenal gland.  # S/p WBRT radiation therapy. (Feb of 2017;]  Startingchemotherapy December 26, 2015 with carboplatinum and VP-16.  # July 12th 2017- Recurrence/ progression- on CT- Carbo-Etopside    # MOLECULAR TESTING- July 13th-[facility]- STABLE; [facility]- NOT enough tissue      Cancer of lower lobe of left lung ([place])   12/05/2015 Initial Diagnosis    Cancer of lower lobe of left lung ([place])       INTERVAL HISTORY:  Cynthia Carson 65 y.o.  female pleasant patient above history of recurrent extensive stage small cell lung cancer is here for follow-up; Patient is currently getting palliative radiation to the right of the neck. Chemotherapy is currently on hold.  Patient had received a total of 4 units of blood for hemoglobin of 4.8 approximately 2 weeks ago. Today she feels improved. Continues to have fatigue. Cough improved. She is a wheelchair. Notes to have her right neck swelling improved on radiation. Mild swallowing. Poor appetite.No hemoptysis. Denies any chest pain. Positive for nausea no vomiting.  REVIEW OF SYSTEMS:  A complete 10 point review of system is done which is negative except mentioned above/history of present illness.   PAST MEDICAL HISTORY :  Past Medical History:  Diagnosis Date  . Cancer of lung ([place]) 12/05/2015  . Diabetes mellitus without complication ([place])     PAST  SURGICAL HISTORY :   Past Surgical History:  Procedure Laterality Date  . ABDOMINAL HYSTERECTOMY    . PERIPHERAL VASCULAR CATHETERIZATION N/A 01/13/2016   Procedure: Glori Luis Cath Insertion;  Surgeon: Katha Cabal, MD;  Location: [facility];  Service: Cardiovascular;  Laterality: N/A;    FAMILY HISTORY :  No family history on file.  SOCIAL HISTORY:   Social History  Substance Use Topics  . Smoking status: Former Smoker    Packs/day: 1.00    Years: 49.00    Types: Cigarettes    Quit date: 11/19/2011  . Smokeless tobacco: Never Used  . Alcohol use No    ALLERGIES:  is allergic to amoxicillin and levofloxacin.  MEDICATIONS:  Current Outpatient Prescriptions  Medication Sig Dispense Refill  . ALPRAZolam (XANAX) 0.5 MG tablet Take 0.5 mg by mouth 3 (three) times daily as needed for anxiety.     . benzonatate (TESSALON) 200 MG capsule Take 1 capsule (200 mg total) by mouth 3 (three) times daily as needed for cough. 30 capsule 0  . Calcium Carbonate-Vitamin D (CALCIUM-VITAMIN D) 500-200 MG-UNIT tablet Take 1 tablet by mouth daily.    Marland Kitchen glimepiride (AMARYL) 2 MG tablet Take 2 mg by mouth daily with breakfast.     . HYDROcodone-acetaminophen (NORCO/VICODIN) 5-325 MG tablet Take 1 tablet by mouth every 8 (eight) hours as needed for moderate pain. 30 tablet 0  . KLOR-CON M20 20 MEQ tablet TAKE ONE TABLET BY MOUTH DAILY 30 tablet 3  . latanoprost (  XALATAN) 0.005 % ophthalmic solution Place 1 drop into both eyes at bedtime.    . lidocaine-prilocaine (EMLA) cream Apply 1 application topically as needed. 30 g 3  . magic mouthwash w/lidocaine SOLN Take 5 mLs by mouth 4 (four) times daily. 80 ml viscous lidocaine 2%, 80 ml Mylanta, 80 ml Diphenhydramine 12.5 mg/5 ml Elixir, 80 ml Nystatin 100,000 Unit suspension, 80 ml Prednisolone 15 mg/22m, 80 ml Distilled Water. Sig: Swish/Swallow 5-10 ml four times a day as needed. Dispense 480 ml. 3RFs 480 mL 0  . omeprazole (PRILOSEC) 20 MG  capsule Take 20 mg by mouth daily.     . prochlorperazine (COMPAZINE) 10 MG tablet Take 1 tablet (10 mg total) by mouth every 6 (six) hours as needed for nausea or vomiting. 30 tablet 3  . sitaGLIPtin (JANUVIA) 100 MG tablet Take 100 mg by mouth daily.     No current facility-administered medications for this visit.    Facility-Administered Medications Ordered in Other Visits  Medication Dose Route Frequency Provider Last Rate Last Dose  . alteplase (CATHFLO ACTIVASE) injection 2 mg  2 mg Intracatheter Once PRN JForest Gleason MD      . heparin lock flush 100 unit/mL  500 Units Intravenous Once JForest Gleason MD      . heparin lock flush 100 unit/mL  500 Units Intracatheter Once PRN JForest Gleason MD      . heparin lock flush 100 unit/mL  250 Units Intracatheter Once PRN JForest Gleason MD      . sodium chloride 0.9 % injection 10 mL  10 mL Intracatheter PRN JForest Gleason MD      . sodium chloride flush (NS) 0.9 % injection 10 mL  10 mL Intravenous PRN JForest Gleason MD      . sodium chloride flush (NS) 0.9 % injection 10 mL  10 mL Intravenous PRN JForest Gleason MD   10 mL at 02/01/16 1439    PHYSICAL EXAMINATION: ECOG PERFORMANCE STATUS: 1 - Symptomatic but completely ambulatory  BP 103/71 (BP Location: Right Arm, Patient Position: Sitting)   Pulse 88   Temp 97.5 F (36.4 C) (Tympanic)   Resp 20   Ht 5' 5"  (1.651 m)   Wt 128 lb 1.4 oz (58.1 kg)   BMI 21.31 kg/m   Filed Weights   09/19/16 0854  Weight: 128 lb 1.4 oz (58.1 kg)    GENERAL: Thin built well-developed; Alert, no distress and comfortable. She is alone. She Will check. EYES: no pallor or icterus OROPHARYNX: no thrush or ulceration; good dentition  NECK: supple, no masses felt; tenderness on the right side of neck.  LYMPH: 2-3 cm Right supraclavicular adenopathy No axillary or inguinal regions LUNGS: Decreased results of the left lower base No wheeze or crackles HEART/CVS: regular rate & rhythm and no murmurs; No lower  extremity edema ABDOMEN:abdomen soft, non-tender and normal bowel sounds Musculoskeletal:no cyanosis of digits and no clubbing  PSYCH: alert & oriented x 3 with fluent speech NEURO: no focal motor/sensory deficits SKIN:  no rashes or significant lesions  LABORATORY DATA:  I have reviewed the data as listed    Component Value Date/Time   NA 136 09/19/2016 0810   K 3.8 09/19/2016 0810   CL 101 09/19/2016 0810   CO2 24 09/19/2016 0810   GLUCOSE 162 (H) 09/19/2016 0810   BUN 9 09/19/2016 0810   CREATININE 0.84 09/19/2016 0810   CALCIUM 9.1 09/19/2016 0810   PROT 7.6 09/19/2016 0810   ALBUMIN 3.2 (L) 09/19/2016  0810   AST 18 09/19/2016 0810   ALT 11 (L) 09/19/2016 0810   ALKPHOS 70 09/19/2016 0810   BILITOT 0.3 09/19/2016 0810   GFRNONAA >60 09/19/2016 0810   GFRAA >60 09/19/2016 0810    No results found for: SPEP, UPEP  Lab Results  Component Value Date   WBC 5.0 09/19/2016   NEUTROABS 3.3 09/19/2016   HGB 11.3 (L) 09/19/2016   HCT 33.1 (L) 09/19/2016   MCV 92.4 09/19/2016   PLT 204 09/19/2016      Chemistry      Component Value Date/Time   NA 136 09/19/2016 0810   K 3.8 09/19/2016 0810   CL 101 09/19/2016 0810   CO2 24 09/19/2016 0810   BUN 9 09/19/2016 0810   CREATININE 0.84 09/19/2016 0810      Component Value Date/Time   CALCIUM 9.1 09/19/2016 0810   ALKPHOS 70 09/19/2016 0810   AST 18 09/19/2016 0810   ALT 11 (L) 09/19/2016 0810   BILITOT 0.3 09/19/2016 0810       RADIOGRAPHIC STUDIES: I have personally reviewed the radiological images as listed and agreed with the findings in the report. No results found.   ASSESSMENT & PLAN:  Cancer of lower lobe of left lung (HCC) Recurrent small cell lung cancer- Status post cycle #4  carboplatin etoposide-Ct scan progressive disease/ platinum refractory. Discussed re: Topotecan as next line of therapy. Discussed palliative nature treatments. Also discussed that the response rates are very modest.  # Anemia-  severe [4.8]-sec to carboplatin. s/p 4 units of PRBC. Hb-today 11.   # neck adenopathy sec to disease progression- on RT; Dr.Crystal. RT until Nov 9th.   # pain sec to malignancy- improved on hydrocodone.   # follow up on 13th Nov re: Topotecan day 1-5 q/labs.encouraged to bring family.   Orders Placed This Encounter  Procedures  . CBC with Differential    Standing Status:   Future    Standing Expiration Date:   09/19/2017  . Comprehensive metabolic panel    Standing Status:   Future    Standing Expiration Date:   09/19/2017  . Lactate dehydrogenase    Standing Status:   Future    Standing Expiration Date:   09/19/2017   All questions were answered. The patient knows to call the clinic with any problems, questions or concerns.      Cammie Sickle, MD 09/19/2016 5:58 PM

## 2016-09-19 NOTE — Progress Notes (Signed)
START ON PATHWAY REGIMEN - Small Cell Lung  LOS291: Topotecan 2.3 mg/m2 Orally x 5 Days q21 Days Until Disease Progression or Unacceptable Toxicity   A cycle is every 21 days:     Topotecan (Hycamtin(R)) 2.3 mg/m2 Give by mouth daily on days 1-5. Dose Mod: None Additional Orders: Round dose to nearest 0.25 mg. Do not give replacement dose if patient vomits after administration. For CrCl 30-49 reduce dose to 1.5 mg/m2 per day. For CrCl < than 30, reduce dose to 0.6 mg/m2/day. For both reductions, if no severe  hematologic or gastrointestinal toxicities occur, may increase by 0.4 mg/m2/day after first cycle.  **Always confirm dose/schedule in your pharmacy ordering system**    Patient Characteristics: Extensive and Limited Stage, Second Line, Relapse < 3 Months Stage Grouping: Extensive AJCC M Stage: X AJCC N Stage: X AJCC T Stage: X Line of therapy: Second Line Would you be surprised if this patient died  in the next year? I would be surprised if this patient died in the next year Time to Relapse: Relapse < 3 Months  Intent of Therapy: Non-Curative / Palliative Intent, Discussed with Patient

## 2016-09-20 ENCOUNTER — Ambulatory Visit: Payer: BLUE CROSS/BLUE SHIELD

## 2016-09-20 ENCOUNTER — Ambulatory Visit: Payer: BLUE CROSS/BLUE SHIELD | Admitting: Internal Medicine

## 2016-09-20 ENCOUNTER — Other Ambulatory Visit: Payer: BLUE CROSS/BLUE SHIELD

## 2016-09-20 ENCOUNTER — Ambulatory Visit
Admission: RE | Admit: 2016-09-20 | Discharge: 2016-09-20 | Disposition: A | Discharge status: Home / Self Care | Payer: BLUE CROSS/BLUE SHIELD | Source: Ambulatory Visit | Attending: Radiation Oncology | Admitting: Radiation Oncology

## 2016-09-20 DIAGNOSIS — R590 Localized enlarged lymph nodes: Secondary | ICD-10-CM | POA: Diagnosis not present

## 2016-09-21 ENCOUNTER — Ambulatory Visit: Payer: BLUE CROSS/BLUE SHIELD

## 2016-09-24 ENCOUNTER — Ambulatory Visit
Admission: RE | Admit: 2016-09-24 | Discharge: 2016-09-24 | Disposition: A | Discharge status: Home / Self Care | Payer: BLUE CROSS/BLUE SHIELD | Source: Ambulatory Visit | Attending: Radiation Oncology | Admitting: Radiation Oncology

## 2016-09-24 ENCOUNTER — Inpatient Hospital Stay: Payer: BLUE CROSS/BLUE SHIELD

## 2016-09-24 DIAGNOSIS — R590 Localized enlarged lymph nodes: Secondary | ICD-10-CM | POA: Diagnosis not present

## 2016-09-25 ENCOUNTER — Inpatient Hospital Stay: Payer: BLUE CROSS/BLUE SHIELD

## 2016-09-25 ENCOUNTER — Ambulatory Visit
Admission: RE | Admit: 2016-09-25 | Discharge: 2016-09-25 | Disposition: A | Discharge status: Home / Self Care | Payer: BLUE CROSS/BLUE SHIELD | Source: Ambulatory Visit | Attending: Radiation Oncology | Admitting: Radiation Oncology

## 2016-09-25 DIAGNOSIS — R590 Localized enlarged lymph nodes: Secondary | ICD-10-CM | POA: Diagnosis not present

## 2016-09-26 ENCOUNTER — Inpatient Hospital Stay: Payer: BLUE CROSS/BLUE SHIELD

## 2016-09-26 ENCOUNTER — Ambulatory Visit
Admission: RE | Admit: 2016-09-26 | Discharge: 2016-09-26 | Disposition: A | Discharge status: Home / Self Care | Payer: BLUE CROSS/BLUE SHIELD | Source: Ambulatory Visit | Attending: Radiation Oncology | Admitting: Radiation Oncology

## 2016-09-26 DIAGNOSIS — R590 Localized enlarged lymph nodes: Secondary | ICD-10-CM | POA: Diagnosis not present

## 2016-09-27 ENCOUNTER — Ambulatory Visit: Payer: BLUE CROSS/BLUE SHIELD

## 2016-09-27 ENCOUNTER — Inpatient Hospital Stay: Payer: BLUE CROSS/BLUE SHIELD

## 2016-09-27 ENCOUNTER — Ambulatory Visit
Admission: RE | Admit: 2016-09-27 | Discharge: 2016-09-27 | Disposition: A | Discharge status: Home / Self Care | Payer: BLUE CROSS/BLUE SHIELD | Source: Ambulatory Visit | Attending: Radiation Oncology | Admitting: Radiation Oncology

## 2016-09-27 DIAGNOSIS — R590 Localized enlarged lymph nodes: Secondary | ICD-10-CM | POA: Diagnosis not present

## 2016-09-28 ENCOUNTER — Ambulatory Visit: Payer: BLUE CROSS/BLUE SHIELD

## 2016-09-28 ENCOUNTER — Inpatient Hospital Stay: Payer: BLUE CROSS/BLUE SHIELD

## 2016-09-28 ENCOUNTER — Ambulatory Visit
Admission: RE | Admit: 2016-09-28 | Discharge: 2016-09-28 | Disposition: A | Discharge status: Home / Self Care | Payer: BLUE CROSS/BLUE SHIELD | Source: Ambulatory Visit | Attending: Radiation Oncology | Admitting: Radiation Oncology

## 2016-09-28 DIAGNOSIS — R590 Localized enlarged lymph nodes: Secondary | ICD-10-CM | POA: Diagnosis not present

## 2016-10-01 ENCOUNTER — Inpatient Hospital Stay: Payer: BLUE CROSS/BLUE SHIELD

## 2016-10-01 ENCOUNTER — Inpatient Hospital Stay: Payer: BLUE CROSS/BLUE SHIELD | Admitting: Internal Medicine

## 2016-10-01 ENCOUNTER — Ambulatory Visit: Payer: BLUE CROSS/BLUE SHIELD

## 2016-10-02 ENCOUNTER — Inpatient Hospital Stay: Payer: BLUE CROSS/BLUE SHIELD

## 2016-10-02 ENCOUNTER — Ambulatory Visit: Payer: BLUE CROSS/BLUE SHIELD

## 2016-10-03 ENCOUNTER — Inpatient Hospital Stay: Payer: BLUE CROSS/BLUE SHIELD

## 2016-10-03 ENCOUNTER — Ambulatory Visit: Payer: BLUE CROSS/BLUE SHIELD

## 2016-10-04 ENCOUNTER — Inpatient Hospital Stay: Payer: BLUE CROSS/BLUE SHIELD

## 2016-10-05 ENCOUNTER — Inpatient Hospital Stay: Payer: BLUE CROSS/BLUE SHIELD

## 2016-10-08 ENCOUNTER — Inpatient Hospital Stay: Payer: BLUE CROSS/BLUE SHIELD

## 2016-10-08 ENCOUNTER — Inpatient Hospital Stay (HOSPITAL_BASED_OUTPATIENT_CLINIC_OR_DEPARTMENT_OTHER): Payer: BLUE CROSS/BLUE SHIELD | Admitting: Internal Medicine

## 2016-10-08 VITALS — BP 105/74 | HR 103 | Temp 97.6°F | Resp 18 | Wt 121.9 lb

## 2016-10-08 DIAGNOSIS — R05 Cough: Secondary | ICD-10-CM

## 2016-10-08 DIAGNOSIS — C7970 Secondary malignant neoplasm of unspecified adrenal gland: Secondary | ICD-10-CM

## 2016-10-08 DIAGNOSIS — E119 Type 2 diabetes mellitus without complications: Secondary | ICD-10-CM

## 2016-10-08 DIAGNOSIS — C3432 Malignant neoplasm of lower lobe, left bronchus or lung: Secondary | ICD-10-CM

## 2016-10-08 DIAGNOSIS — D6481 Anemia due to antineoplastic chemotherapy: Secondary | ICD-10-CM

## 2016-10-08 DIAGNOSIS — R590 Localized enlarged lymph nodes: Secondary | ICD-10-CM

## 2016-10-08 DIAGNOSIS — Z5111 Encounter for antineoplastic chemotherapy: Secondary | ICD-10-CM | POA: Diagnosis not present

## 2016-10-08 DIAGNOSIS — C7900 Secondary malignant neoplasm of unspecified kidney and renal pelvis: Secondary | ICD-10-CM | POA: Diagnosis not present

## 2016-10-08 DIAGNOSIS — C7931 Secondary malignant neoplasm of brain: Secondary | ICD-10-CM | POA: Diagnosis not present

## 2016-10-08 DIAGNOSIS — Z923 Personal history of irradiation: Secondary | ICD-10-CM

## 2016-10-08 DIAGNOSIS — Z79899 Other long term (current) drug therapy: Secondary | ICD-10-CM

## 2016-10-08 DIAGNOSIS — Z9221 Personal history of antineoplastic chemotherapy: Secondary | ICD-10-CM

## 2016-10-08 DIAGNOSIS — R110 Nausea: Secondary | ICD-10-CM

## 2016-10-08 DIAGNOSIS — G893 Neoplasm related pain (acute) (chronic): Secondary | ICD-10-CM

## 2016-10-08 DIAGNOSIS — Z87891 Personal history of nicotine dependence: Secondary | ICD-10-CM

## 2016-10-08 DIAGNOSIS — R5383 Other fatigue: Secondary | ICD-10-CM

## 2016-10-08 DIAGNOSIS — Z7984 Long term (current) use of oral hypoglycemic drugs: Secondary | ICD-10-CM

## 2016-10-08 LAB — COMPREHENSIVE METABOLIC PANEL
ALT: 8 U/L — AB (ref 14–54)
ANION GAP: 7 (ref 5–15)
AST: 15 U/L (ref 15–41)
Albumin: 3 g/dL — ABNORMAL LOW (ref 3.5–5.0)
Alkaline Phosphatase: 63 U/L (ref 38–126)
BUN: 13 mg/dL (ref 6–20)
CHLORIDE: 100 mmol/L — AB (ref 101–111)
CO2: 26 mmol/L (ref 22–32)
Calcium: 8.9 mg/dL (ref 8.9–10.3)
Creatinine, Ser: 0.9 mg/dL (ref 0.44–1.00)
GFR calc non Af Amer: >60 mL/min (ref >60)
Glucose, Bld: 130 mg/dL — ABNORMAL HIGH (ref 65–99)
POTASSIUM: 3.5 mmol/L (ref 3.5–5.1)
SODIUM: 133 mmol/L — AB (ref 135–145)
Total Bilirubin: 0.4 mg/dL (ref 0.3–1.2)
Total Protein: 7.3 g/dL (ref 6.5–8.1)

## 2016-10-08 LAB — CBC WITH DIFFERENTIAL/PLATELET
Basophils Absolute: 0 10*3/uL (ref 0–0.1)
Basophils Absolute: 0 10*3/uL (ref 0–0.1)
Basophils Relative: 1 %
Basophils Relative: 1 %
EOS ABS: 0.1 10*3/uL (ref 0–0.7)
EOS PCT: 1 %
Eosinophils Absolute: 0.1 10*3/uL (ref 0–0.7)
Eosinophils Relative: 1 %
HCT: 27.1 % — ABNORMAL LOW (ref 35.0–47.0)
HEMATOCRIT: 27 % — AB (ref 35.0–47.0)
HEMOGLOBIN: 9.3 g/dL — AB (ref 12.0–16.0)
Hemoglobin: 9.2 g/dL — ABNORMAL LOW (ref 12.0–16.0)
LYMPHS ABS: 0.7 10*3/uL — AB (ref 1.0–3.6)
LYMPHS ABS: 0.7 10*3/uL — AB (ref 1.0–3.6)
LYMPHS PCT: 11 %
Lymphocytes Relative: 12 %
MCH: 31.1 pg (ref 26.0–34.0)
MCH: 30.4 pg (ref 26.0–34.0)
MCHC: 34.4 g/dL (ref 32.0–36.0)
MCHC: 33.8 g/dL (ref 32.0–36.0)
MCV: 90.3 fL (ref 80.0–100.0)
MCV: 90.1 fL (ref 80.0–100.0)
Monocytes Absolute: 0.7 10*3/uL (ref 0.2–0.9)
Monocytes Absolute: 0.7 10*3/uL (ref 0.2–0.9)
Monocytes Relative: 12 %
Monocytes Relative: 11 %
NEUTROS PCT: 75 %
Neutro Abs: 4.8 10*3/uL (ref 1.4–6.5)
Neutro Abs: 4.3 10*3/uL (ref 1.4–6.5)
Neutrophils Relative %: 75 %
PLATELETS: 179 10*3/uL (ref 150–440)
Platelets: 183 10*3/uL (ref 150–440)
RBC: 2.99 MIL/uL — AB (ref 3.80–5.20)
RBC: 3.01 MIL/uL — AB (ref 3.80–5.20)
RDW: 18.7 % — ABNORMAL HIGH (ref 11.5–14.5)
RDW: 19 % — ABNORMAL HIGH (ref 11.5–14.5)
WBC: 6.3 10*3/uL (ref 3.6–11.0)
WBC: 5.8 10*3/uL (ref 3.6–11.0)

## 2016-10-08 LAB — LACTATE DEHYDROGENASE: LDH: 660 U/L — ABNORMAL HIGH (ref 98–192)

## 2016-10-08 MED ORDER — HYDROCOD POLST-CPM POLST ER 10-8 MG/5ML PO SUER: 5.0000 mL | Freq: Two times a day (BID) | ORAL | 0 refills | Status: DC | PRN

## 2016-10-08 MED ORDER — PROCHLORPERAZINE MALEATE 10 MG PO TABS
10.0000 mg | ORAL_TABLET | Freq: Once | ORAL | Status: AC
  Administered 2016-10-08: 10 mg via ORAL
  Filled 2016-10-08: qty 1

## 2016-10-08 MED ORDER — TOPOTECAN HCL CHEMO INJECTION 4 MG
1.5000 mg/m2 | Freq: Once | INTRAVENOUS | Status: AC
  Administered 2016-10-08: 2.4 mg via INTRAVENOUS
  Filled 2016-10-08: qty 2.4

## 2016-10-08 MED ORDER — SODIUM CHLORIDE 0.9% FLUSH
10.0000 mL | INTRAVENOUS | Status: DC | PRN
  Administered 2016-10-08: 10 mL
  Filled 2016-10-08: qty 10

## 2016-10-08 MED ORDER — HEPARIN SOD (PORK) LOCK FLUSH 100 UNIT/ML IV SOLN
500.0000 [IU] | Freq: Once | INTRAVENOUS | Status: AC | PRN
  Administered 2016-10-08: 500 [IU]
  Filled 2016-10-08: qty 5

## 2016-10-08 MED ORDER — SODIUM CHLORIDE 0.9 % IV SOLN
Freq: Once | INTRAVENOUS | Status: AC
  Administered 2016-10-08: 11:00:00 via INTRAVENOUS
  Filled 2016-10-08: qty 1000

## 2016-10-08 NOTE — Progress Notes (Signed)
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 Diagnosis in January of 2017. Dx-  right supraclavicular lymph node biopsy.  # T2 N3 M1 stage IV disease with metastases to the brain, kidney, and adrenal gland.  # S/p WBRT radiation therapy. (Feb of 2017;]  Startingchemotherapy December 26, 2015 with carboplatinum and VP-16.  # July 12th 2017- Recurrence/ progression- on CT- Carbo-Etopside x4- Progression noted; s/p RT to Right neck  # NOV 20th 2017- Start Topotecan every 21 days.     # MOLECULAR TESTING- July 13th-[facility]- STABLE; [facility]- NOT enough tissue      Cancer of lower lobe of left lung ([place])   12/05/2015 Initial Diagnosis    Cancer of lower lobe of left lung ([place])       INTERVAL HISTORY:  Cynthia Carson 65 y.o.  female pleasant patient above history of recurrent extensive stage small cell lung cancer is here for follow-up; Patient is currently Status post palliative radiation to the right neck is here for follow-up.  Patient's chemotherapy was held last week because of significant anemia.   She noted to have swelling right neck higher; and also left neck. She continues to complain of significant cough. She is a wheelchair. Mild swallowing. Poor appetite.No hemoptysis. Denies any chest pain. Positive for nausea no vomiting.  REVIEW OF SYSTEMS:  A complete 10 point review of system is done which is negative except mentioned above/history of present illness.   PAST MEDICAL HISTORY :  Past Medical History:  Diagnosis Date  . Cancer of lung ([place]) 12/05/2015  . Diabetes  mellitus without complication ([place])     PAST SURGICAL HISTORY :   Past Surgical History:  Procedure Laterality Date  . ABDOMINAL HYSTERECTOMY    . PERIPHERAL VASCULAR CATHETERIZATION N/A 01/13/2016   Procedure: Glori Luis Cath Insertion;  Surgeon: Katha Cabal, MD;  Location: [facility];  Service: Cardiovascular;  Laterality: N/A;    FAMILY HISTORY :  No family history on file.  SOCIAL HISTORY:   Social History  Substance Use Topics  . Smoking status: Former Smoker    Packs/day: 1.00    Years: 49.00    Types: Cigarettes    Quit date: 11/19/2011  . Smokeless tobacco: Never Used  . Alcohol use No    ALLERGIES:  is allergic to amoxicillin and levofloxacin.  MEDICATIONS:  Current Outpatient Prescriptions  Medication Sig Dispense Refill  . ALPRAZolam (XANAX) 0.5 MG tablet Take 0.5 mg by mouth 3 (three) times daily as needed for anxiety.     . benzonatate (TESSALON) 200 MG capsule Take 1 capsule (200 mg total) by mouth 3 (three) times daily as needed for cough. 30 capsule 0  . Calcium Carbonate-Vitamin D (CALCIUM-VITAMIN D) 500-200 MG-UNIT tablet Take 1 tablet by mouth daily.    Marland Kitchen glimepiride (AMARYL) 2 MG tablet Take 2 mg by mouth daily with breakfast.     . HYDROcodone-acetaminophen (NORCO/VICODIN) 5-325 MG tablet Take 1 tablet by mouth every 8 (eight) hours as needed for moderate pain. 30 tablet 0  . KLOR-CON M20 20 MEQ tablet TAKE ONE  TABLET BY MOUTH DAILY 30 tablet 3  . latanoprost (XALATAN) 0.005 % ophthalmic solution Place 1 drop into both eyes at bedtime.    . lidocaine-prilocaine (EMLA) cream Apply 1 application topically as needed. 30 g 3  . magic mouthwash w/lidocaine SOLN Take 5 mLs by mouth 4 (four) times daily. 80 ml viscous lidocaine 2%, 80 ml Mylanta, 80 ml Diphenhydramine 12.5 mg/5 ml Elixir, 80 ml Nystatin 100,000 Unit suspension, 80 ml Prednisolone 15 mg/75m, 80 ml Distilled Water. Sig: Swish/Swallow 5-10 ml four times a day as needed. Dispense 480 ml.  3RFs 480 mL 0  . omeprazole (PRILOSEC) 20 MG capsule Take 20 mg by mouth daily.     . prochlorperazine (COMPAZINE) 10 MG tablet Take 1 tablet (10 mg total) by mouth every 6 (six) hours as needed for nausea or vomiting. 30 tablet 3  . sitaGLIPtin (JANUVIA) 100 MG tablet Take 100 mg by mouth daily.    . chlorpheniramine-HYDROcodone (TUSSIONEX PENNKINETIC ER) 10-8 MG/5ML SUER Take 5 mLs by mouth every 12 (twelve) hours as needed for cough. 115 mL 0   No current facility-administered medications for this visit.    Facility-Administered Medications Ordered in Other Visits  Medication Dose Route Frequency Provider Last Rate Last Dose  . alteplase (CATHFLO ACTIVASE) injection 2 mg  2 mg Intracatheter Once PRN JForest Gleason MD      . heparin lock flush 100 unit/mL  500 Units Intravenous Once JForest Gleason MD      . heparin lock flush 100 unit/mL  500 Units Intracatheter Once PRN JForest Gleason MD      . heparin lock flush 100 unit/mL  250 Units Intracatheter Once PRN JForest Gleason MD      . sodium chloride 0.9 % injection 10 mL  10 mL Intracatheter PRN JForest Gleason MD      . sodium chloride flush (NS) 0.9 % injection 10 mL  10 mL Intravenous PRN JForest Gleason MD      . sodium chloride flush (NS) 0.9 % injection 10 mL  10 mL Intravenous PRN JForest Gleason MD   10 mL at 02/01/16 1439  . sodium chloride flush (NS) 0.9 % injection 10 mL  10 mL Intracatheter PRN GCammie Sickle MD   10 mL at 10/08/16 0929    PHYSICAL EXAMINATION: ECOG PERFORMANCE STATUS: 1 - Symptomatic but completely ambulatory  BP 105/74 (BP Location: Left Arm, Patient Position: Sitting)   Pulse (!) 103   Temp 97.6 F (36.4 C) (Tympanic)   Resp 18   Wt 121 lb 14.4 oz (55.3 kg)   BMI 20.29 kg/m   Filed Weights   10/08/16 0942  Weight: 121 lb 14.4 oz (55.3 kg)    GENERAL: Thin built well-developed; Alert, no distress and comfortable. She is alone. She Will check. EYES: no pallor or icterus OROPHARYNX: no thrush or  ulceration; good dentition  NECK: supple, no masses felt; tenderness on the right side of neck.  LYMPH: 2-3 cm Right supraclavicular adenopathy No axillary or inguinal regions LUNGS: Decreased results of the left lower base No wheeze or crackles HEART/CVS: regular rate & rhythm and no murmurs; No lower extremity edema ABDOMEN:abdomen soft, non-tender and normal bowel sounds Musculoskeletal:no cyanosis of digits and no clubbing  PSYCH: alert & oriented x 3 with fluent speech NEURO: no focal motor/sensory deficits SKIN:  no rashes or significant lesions  LABORATORY DATA:  I have reviewed the data as listed    Component Value Date/Time   NA 133 (L)  10/08/2016 1000   K 3.5 10/08/2016 1000   CL 100 (L) 10/08/2016 1000   CO2 26 10/08/2016 1000   GLUCOSE 130 (H) 10/08/2016 1000   BUN 13 10/08/2016 1000   CREATININE 0.90 10/08/2016 1000   CALCIUM 8.9 10/08/2016 1000   PROT 7.3 10/08/2016 1000   ALBUMIN 3.0 (L) 10/08/2016 1000   AST 15 10/08/2016 1000   ALT 8 (L) 10/08/2016 1000   ALKPHOS 63 10/08/2016 1000   BILITOT 0.4 10/08/2016 1000   GFRNONAA >60 10/08/2016 1000   GFRAA >60 10/08/2016 1000    No results found for: SPEP, UPEP  Lab Results  Component Value Date   WBC 5.8 10/08/2016   NEUTROABS 4.3 10/08/2016   HGB 9.2 (L) 10/08/2016   HCT 27.1 (L) 10/08/2016   MCV 90.1 10/08/2016   PLT 179 10/08/2016      Chemistry      Component Value Date/Time   NA 133 (L) 10/08/2016 1000   K 3.5 10/08/2016 1000   CL 100 (L) 10/08/2016 1000   CO2 26 10/08/2016 1000   BUN 13 10/08/2016 1000   CREATININE 0.90 10/08/2016 1000      Component Value Date/Time   CALCIUM 8.9 10/08/2016 1000   ALKPHOS 63 10/08/2016 1000   AST 15 10/08/2016 1000   ALT 8 (L) 10/08/2016 1000   BILITOT 0.4 10/08/2016 1000       RADIOGRAPHIC STUDIES: I have personally reviewed the radiological images as listed and agreed with the findings in the report. No results found.   ASSESSMENT & PLAN:   Cancer of lower lobe of left lung (HCC) Recurrent small cell lung cancer- Ct scan progressive disease/ platinum refractory. Discussed re: Topotecan  Discussed palliative nature treatments. Also discussed that the response rates are very modest. S/p neck adenopathy sec to disease progression- on RT; Dr.Crystal. Progression noted above the area of RT; bil neck.    # Proceed with Topotecan starting cycle #1; given holidays; will plan to do chemo day 4; 5 next week.   # Hypocalcemia- TUMs TID; ca+vit D.   # pain sec to malignancy- improved on hydrocodone.   # Worsening cough recommend Tusionex bid.   # Discussed DNR/DNI- no decisions made.  # follow up in 3 weeks for chemo days- 1-5/labs.   Orders Placed This Encounter  Procedures  . Lactate dehydrogenase    Standing Status:   Future    Number of Occurrences:   1    Standing Expiration Date:   10/08/2017   All questions were answered. The patient knows to call the clinic with any problems, questions or concerns.      Cammie Sickle, MD 10/08/2016 5:04 PM

## 2016-10-08 NOTE — Progress Notes (Signed)
Patient is here for follow up, she is forcing herself to eat three times a day, she mentions she has fatigue.

## 2016-10-08 NOTE — Assessment & Plan Note (Addendum)
Recurrent small cell lung cancer- Ct scan progressive disease/ platinum refractory. Discussed re: Topotecan  Discussed palliative nature treatments. Also discussed that the response rates are very modest. S/p neck adenopathy sec to disease progression- on RT; Dr.Crystal. Progression noted above the area of RT; bil neck.    # Proceed with Topotecan starting cycle #1; given holidays; will plan to do chemo day 4; 5 next week.   # Hypocalcemia- TUMs TID; ca+vit D.   # pain sec to malignancy- improved on hydrocodone.   # Worsening cough recommend Tusionex bid.   # Discussed DNR/DNI- no decisions made.  # follow up in 3 weeks for chemo days- 1-5/labs. Overall poor prognosis.

## 2016-10-09 ENCOUNTER — Inpatient Hospital Stay: Payer: BLUE CROSS/BLUE SHIELD

## 2016-10-09 VITALS — BP 90/64 | HR 102 | Temp 96.8°F | Resp 20

## 2016-10-09 DIAGNOSIS — R590 Localized enlarged lymph nodes: Secondary | ICD-10-CM

## 2016-10-09 DIAGNOSIS — Z5111 Encounter for antineoplastic chemotherapy: Secondary | ICD-10-CM | POA: Diagnosis not present

## 2016-10-09 DIAGNOSIS — C3432 Malignant neoplasm of lower lobe, left bronchus or lung: Secondary | ICD-10-CM

## 2016-10-09 MED ORDER — SODIUM CHLORIDE 0.9 % IV SOLN
Freq: Once | INTRAVENOUS | Status: AC
  Administered 2016-10-09: 14:00:00 via INTRAVENOUS
  Filled 2016-10-09: qty 1000

## 2016-10-09 MED ORDER — TOPOTECAN HCL CHEMO INJECTION 4 MG
1.5000 mg/m2 | Freq: Once | INTRAVENOUS | Status: AC
  Administered 2016-10-09: 2.4 mg via INTRAVENOUS
  Filled 2016-10-09: qty 2.4

## 2016-10-09 MED ORDER — HEPARIN SOD (PORK) LOCK FLUSH 100 UNIT/ML IV SOLN
500.0000 [IU] | Freq: Once | INTRAVENOUS | Status: AC | PRN
  Administered 2016-10-09: 500 [IU]
  Filled 2016-10-09: qty 5

## 2016-10-09 MED ORDER — PROCHLORPERAZINE MALEATE 10 MG PO TABS
10.0000 mg | ORAL_TABLET | Freq: Once | ORAL | Status: AC
  Administered 2016-10-09: 10 mg via ORAL
  Filled 2016-10-09: qty 1

## 2016-10-09 MED ORDER — SODIUM CHLORIDE 0.9% FLUSH
10.0000 mL | INTRAVENOUS | Status: DC | PRN
  Administered 2016-10-09: 10 mL
  Filled 2016-10-09: qty 10

## 2016-10-10 ENCOUNTER — Inpatient Hospital Stay: Payer: BLUE CROSS/BLUE SHIELD

## 2016-10-10 VITALS — BP 101/69 | HR 99 | Temp 98.2°F | Resp 16

## 2016-10-10 DIAGNOSIS — Z5111 Encounter for antineoplastic chemotherapy: Secondary | ICD-10-CM | POA: Diagnosis not present

## 2016-10-10 DIAGNOSIS — C3432 Malignant neoplasm of lower lobe, left bronchus or lung: Secondary | ICD-10-CM

## 2016-10-10 DIAGNOSIS — R590 Localized enlarged lymph nodes: Secondary | ICD-10-CM

## 2016-10-10 MED ORDER — PROCHLORPERAZINE MALEATE 10 MG PO TABS
10.0000 mg | ORAL_TABLET | Freq: Once | ORAL | Status: AC
  Administered 2016-10-10: 10 mg via ORAL
  Filled 2016-10-10: qty 1

## 2016-10-10 MED ORDER — HEPARIN SOD (PORK) LOCK FLUSH 100 UNIT/ML IV SOLN
500.0000 [IU] | Freq: Once | INTRAVENOUS | Status: AC
  Administered 2016-10-10: 500 [IU] via INTRAVENOUS
  Filled 2016-10-10: qty 5

## 2016-10-10 MED ORDER — SODIUM CHLORIDE 0.9 % IV SOLN
Freq: Once | INTRAVENOUS | Status: AC
  Administered 2016-10-10: 14:00:00 via INTRAVENOUS
  Filled 2016-10-10: qty 1000

## 2016-10-10 MED ORDER — SODIUM CHLORIDE 0.9 % IJ SOLN
10.0000 mL | Freq: Once | INTRAMUSCULAR | Status: AC
  Administered 2016-10-10: 10 mL via INTRAVENOUS
  Filled 2016-10-10: qty 10

## 2016-10-10 MED ORDER — TOPOTECAN HCL CHEMO INJECTION 4 MG
1.5000 mg/m2 | Freq: Once | INTRAVENOUS | Status: AC
  Administered 2016-10-10: 2.4 mg via INTRAVENOUS
  Filled 2016-10-10: qty 2.4

## 2016-10-15 ENCOUNTER — Inpatient Hospital Stay: Payer: BLUE CROSS/BLUE SHIELD

## 2016-10-15 VITALS — BP 111/72 | HR 95 | Temp 97.0°F | Resp 18

## 2016-10-15 DIAGNOSIS — Z5111 Encounter for antineoplastic chemotherapy: Secondary | ICD-10-CM | POA: Diagnosis not present

## 2016-10-15 DIAGNOSIS — C3432 Malignant neoplasm of lower lobe, left bronchus or lung: Secondary | ICD-10-CM

## 2016-10-15 DIAGNOSIS — R590 Localized enlarged lymph nodes: Secondary | ICD-10-CM

## 2016-10-15 MED ORDER — PROCHLORPERAZINE MALEATE 10 MG PO TABS
10.0000 mg | ORAL_TABLET | Freq: Once | ORAL | Status: AC
  Administered 2016-10-15: 10 mg via ORAL
  Filled 2016-10-15: qty 1

## 2016-10-15 MED ORDER — SODIUM CHLORIDE 0.9% FLUSH
10.0000 mL | INTRAVENOUS | Status: DC | PRN
  Administered 2016-10-15: 10 mL
  Filled 2016-10-15: qty 10

## 2016-10-15 MED ORDER — HEPARIN SOD (PORK) LOCK FLUSH 100 UNIT/ML IV SOLN
500.0000 [IU] | Freq: Once | INTRAVENOUS | Status: AC | PRN
  Administered 2016-10-15: 500 [IU]
  Filled 2016-10-15: qty 5

## 2016-10-15 MED ORDER — SODIUM CHLORIDE 0.9 % IV SOLN
Freq: Once | INTRAVENOUS | Status: AC
  Administered 2016-10-15: 14:00:00 via INTRAVENOUS
  Filled 2016-10-15: qty 1000

## 2016-10-15 MED ORDER — TOPOTECAN HCL CHEMO INJECTION 4 MG
1.5000 mg/m2 | Freq: Once | INTRAVENOUS | Status: AC
  Administered 2016-10-15: 2.4 mg via INTRAVENOUS
  Filled 2016-10-15: qty 2.4

## 2016-10-16 ENCOUNTER — Telehealth: Payer: Self-pay | Admitting: Internal Medicine

## 2016-10-16 ENCOUNTER — Telehealth: Payer: Self-pay | Admitting: *Deleted

## 2016-10-16 ENCOUNTER — Inpatient Hospital Stay
Admission: EM | Admit: 2016-10-16 | Discharge: 2016-10-19 | DRG: 189 | Payer: BLUE CROSS/BLUE SHIELD | Attending: Internal Medicine | Admitting: Internal Medicine

## 2016-10-16 ENCOUNTER — Inpatient Hospital Stay: Payer: BLUE CROSS/BLUE SHIELD

## 2016-10-16 ENCOUNTER — Encounter: Payer: Self-pay | Admitting: Emergency Medicine

## 2016-10-16 ENCOUNTER — Emergency Department: Payer: BLUE CROSS/BLUE SHIELD

## 2016-10-16 DIAGNOSIS — Z6820 Body mass index (BMI) 20.0-20.9, adult: Secondary | ICD-10-CM

## 2016-10-16 DIAGNOSIS — Z888 Allergy status to other drugs, medicaments and biological substances status: Secondary | ICD-10-CM | POA: Diagnosis not present

## 2016-10-16 DIAGNOSIS — C3492 Malignant neoplasm of unspecified part of left bronchus or lung: Secondary | ICD-10-CM | POA: Diagnosis present

## 2016-10-16 DIAGNOSIS — Z9889 Other specified postprocedural states: Secondary | ICD-10-CM | POA: Diagnosis not present

## 2016-10-16 DIAGNOSIS — E46 Unspecified protein-calorie malnutrition: Secondary | ICD-10-CM | POA: Diagnosis present

## 2016-10-16 DIAGNOSIS — J9819 Other pulmonary collapse: Secondary | ICD-10-CM

## 2016-10-16 DIAGNOSIS — R042 Hemoptysis: Secondary | ICD-10-CM | POA: Diagnosis present

## 2016-10-16 DIAGNOSIS — Z66 Do not resuscitate: Secondary | ICD-10-CM | POA: Diagnosis present

## 2016-10-16 DIAGNOSIS — J9601 Acute respiratory failure with hypoxia: Principal | ICD-10-CM | POA: Diagnosis present

## 2016-10-16 DIAGNOSIS — Z515 Encounter for palliative care: Secondary | ICD-10-CM | POA: Diagnosis present

## 2016-10-16 DIAGNOSIS — C7931 Secondary malignant neoplasm of brain: Secondary | ICD-10-CM | POA: Diagnosis not present

## 2016-10-16 DIAGNOSIS — E119 Type 2 diabetes mellitus without complications: Secondary | ICD-10-CM | POA: Diagnosis present

## 2016-10-16 DIAGNOSIS — Z9221 Personal history of antineoplastic chemotherapy: Secondary | ICD-10-CM | POA: Diagnosis not present

## 2016-10-16 DIAGNOSIS — C801 Malignant (primary) neoplasm, unspecified: Secondary | ICD-10-CM

## 2016-10-16 DIAGNOSIS — J9620 Acute and chronic respiratory failure, unspecified whether with hypoxia or hypercapnia: Secondary | ICD-10-CM | POA: Diagnosis not present

## 2016-10-16 DIAGNOSIS — Z87891 Personal history of nicotine dependence: Secondary | ICD-10-CM

## 2016-10-16 DIAGNOSIS — Z90710 Acquired absence of both cervix and uterus: Secondary | ICD-10-CM

## 2016-10-16 DIAGNOSIS — Z880 Allergy status to penicillin: Secondary | ICD-10-CM

## 2016-10-16 DIAGNOSIS — C3432 Malignant neoplasm of lower lobe, left bronchus or lung: Secondary | ICD-10-CM | POA: Diagnosis not present

## 2016-10-16 DIAGNOSIS — D61818 Other pancytopenia: Secondary | ICD-10-CM | POA: Diagnosis present

## 2016-10-16 DIAGNOSIS — C799 Secondary malignant neoplasm of unspecified site: Secondary | ICD-10-CM | POA: Diagnosis not present

## 2016-10-16 DIAGNOSIS — C7900 Secondary malignant neoplasm of unspecified kidney and renal pelvis: Secondary | ICD-10-CM | POA: Diagnosis not present

## 2016-10-16 LAB — CBC WITH DIFFERENTIAL/PLATELET
BASOS ABS: 0 10*3/uL (ref 0–0.1)
BASOS PCT: 1 %
EOS ABS: 0 10*3/uL (ref 0–0.7)
EOS PCT: 3 %
HCT: 21.9 % — ABNORMAL LOW (ref 35.0–47.0)
HEMOGLOBIN: 7.5 g/dL — AB (ref 12.0–16.0)
Lymphocytes Relative: 28 %
Lymphs Abs: 0.4 10*3/uL — ABNORMAL LOW (ref 1.0–3.6)
MCH: 30.7 pg (ref 26.0–34.0)
MCHC: 34.3 g/dL (ref 32.0–36.0)
MCV: 89.6 fL (ref 80.0–100.0)
Monocytes Absolute: 0.1 10*3/uL — ABNORMAL LOW (ref 0.2–0.9)
Monocytes Relative: 5 %
NEUTROS PCT: 63 %
Neutro Abs: 1 10*3/uL — ABNORMAL LOW (ref 1.4–6.5)
PLATELETS: 42 10*3/uL — AB (ref 150–440)
RBC: 2.44 MIL/uL — AB (ref 3.80–5.20)
RDW: 18.3 % — ABNORMAL HIGH (ref 11.5–14.5)
WBC: 1.6 10*3/uL — AB (ref 3.6–11.0)

## 2016-10-16 LAB — BASIC METABOLIC PANEL
Anion gap: 11 (ref 5–15)
BUN: 12 mg/dL (ref 6–20)
CHLORIDE: 100 mmol/L — AB (ref 101–111)
CO2: 24 mmol/L (ref 22–32)
Calcium: 9.2 mg/dL (ref 8.9–10.3)
Creatinine, Ser: 0.87 mg/dL (ref 0.44–1.00)
Glucose, Bld: 138 mg/dL — ABNORMAL HIGH (ref 65–99)
POTASSIUM: 3.3 mmol/L — AB (ref 3.5–5.1)
SODIUM: 135 mmol/L (ref 135–145)

## 2016-10-16 LAB — BRAIN NATRIURETIC PEPTIDE: B NATRIURETIC PEPTIDE 5: 14 pg/mL (ref 0.0–100.0)

## 2016-10-16 LAB — LACTIC ACID, PLASMA: Lactic Acid, Venous: 1 mmol/L (ref 0.5–1.9)

## 2016-10-16 LAB — PROTIME-INR
INR: 1.04
PROTHROMBIN TIME: 13.6 s (ref 11.4–15.2)

## 2016-10-16 LAB — GLUCOSE, CAPILLARY: Glucose-Capillary: 133 mg/dL — ABNORMAL HIGH (ref 65–99)

## 2016-10-16 LAB — TROPONIN I

## 2016-10-16 LAB — MRSA PCR SCREENING: MRSA by PCR: NEGATIVE

## 2016-10-16 LAB — PREPARE RBC (CROSSMATCH)

## 2016-10-16 LAB — APTT: aPTT: 60 seconds — ABNORMAL HIGH (ref 24–36)

## 2016-10-16 MED ORDER — VANCOMYCIN HCL IN DEXTROSE 1-5 GM/200ML-% IV SOLN
1000.0000 mg | Freq: Once | INTRAVENOUS | Status: AC
  Administered 2016-10-16: 1000 mg via INTRAVENOUS
  Filled 2016-10-16: qty 200

## 2016-10-16 MED ORDER — INSULIN ASPART 100 UNIT/ML ~~LOC~~ SOLN: 0.0000 [IU] | Freq: Every day | SUBCUTANEOUS | Status: DC

## 2016-10-16 MED ORDER — POTASSIUM CHLORIDE 2 MEQ/ML IV SOLN
INTRAVENOUS | Status: DC
  Administered 2016-10-16 – 2016-10-17 (×2): via INTRAVENOUS
  Filled 2016-10-16 (×5): qty 1000

## 2016-10-16 MED ORDER — ACETAMINOPHEN 325 MG PO TABS: 650.0000 mg | ORAL_TABLET | ORAL | Status: DC | PRN

## 2016-10-16 MED ORDER — ONDANSETRON HCL 4 MG/2ML IJ SOLN: 4.0000 mg | Freq: Four times a day (QID) | INTRAMUSCULAR | Status: DC | PRN

## 2016-10-16 MED ORDER — LINAGLIPTIN 5 MG PO TABS
5.0000 mg | ORAL_TABLET | Freq: Every day | ORAL | Status: DC
  Administered 2016-10-16 – 2016-10-17 (×2): 5 mg via ORAL
  Filled 2016-10-16 (×2): qty 1

## 2016-10-16 MED ORDER — PANTOPRAZOLE SODIUM 40 MG PO TBEC
40.0000 mg | DELAYED_RELEASE_TABLET | Freq: Every day | ORAL | Status: DC
  Administered 2016-10-16 – 2016-10-17 (×2): 40 mg via ORAL
  Filled 2016-10-16 (×2): qty 1

## 2016-10-16 MED ORDER — PIPERACILLIN-TAZOBACTAM 3.375 G IVPB
3.3750 g | Freq: Once | INTRAVENOUS | Status: AC
  Administered 2016-10-16: 3.375 g via INTRAVENOUS
  Filled 2016-10-16: qty 50

## 2016-10-16 MED ORDER — SODIUM CHLORIDE 0.9 % IV SOLN
10.0000 mL/h | Freq: Once | INTRAVENOUS | Status: DC
Start: 2016-10-16 — End: 2016-10-18

## 2016-10-16 MED ORDER — OXYCODONE HCL 5 MG PO TABS
5.0000 mg | ORAL_TABLET | ORAL | Status: DC | PRN
  Administered 2016-10-16: 5 mg via ORAL
  Filled 2016-10-16: qty 1

## 2016-10-16 MED ORDER — HYDROCOD POLST-CPM POLST ER 10-8 MG/5ML PO SUER: 5.0000 mL | Freq: Two times a day (BID) | ORAL | Status: DC | PRN

## 2016-10-16 MED ORDER — IOPAMIDOL (ISOVUE-370) INJECTION 76%
75.0000 mL | Freq: Once | INTRAVENOUS | Status: AC | PRN
  Administered 2016-10-16: 75 mL via INTRAVENOUS

## 2016-10-16 MED ORDER — INSULIN ASPART 100 UNIT/ML ~~LOC~~ SOLN: 0.0000 [IU] | Freq: Three times a day (TID) | SUBCUTANEOUS | Status: DC

## 2016-10-16 MED ORDER — ORAL CARE MOUTH RINSE
15.0000 mL | Freq: Two times a day (BID) | OROMUCOSAL | Status: DC
  Administered 2016-10-16 – 2016-10-19 (×4): 15 mL via OROMUCOSAL

## 2016-10-16 MED ORDER — ALPRAZOLAM 0.5 MG PO TABS
0.5000 mg | ORAL_TABLET | Freq: Three times a day (TID) | ORAL | Status: DC | PRN
  Administered 2016-10-16 – 2016-10-19 (×5): 0.5 mg via ORAL
  Filled 2016-10-16 (×5): qty 1

## 2016-10-16 NOTE — ED Triage Notes (Signed)
Patient presents to the ED via EMS from home.  Patient is a lung cancer patient who received chemotherapy yesterday and was supposed to get chemotherapy today as well.  Patient reports waking up at 3am coughing up blood and that she felt short of breath at that time.  Patient at breakfast this morning at 5:30am.  When EMS arrived to patient's home her oxygen level was 72% on room air.  EMS placed patient on non-rebreather.  Patient is alert and oriented x 4 at this time.

## 2016-10-16 NOTE — Progress Notes (Addendum)
Dr.Brahmandy called me, He knows the pt. He is recommending comfort care and hospice for the pt. HE will see the pt tomorrow morning and discuss that with pt and family.

## 2016-10-16 NOTE — ED Notes (Signed)
Patient has redness to her upper chest/neck area.  Patient reports itching to this area.

## 2016-10-16 NOTE — Progress Notes (Signed)
   10/16/16 1700  Clinical Encounter Type  Visited With Patient;Family;Patient and family together;Health care provider  Visit Type Follow-up;Psychological support;Spiritual support;Social support  Referral From Nurse  Consult/Referral To Chaplain  Recommendations Patient requested prayer, was not interested in Advance Directive.  Spiritual Encounters  Spiritual Needs Prayer;Emotional;Grief support  Stress Factors  Patient Stress Factors Health changes  Family Stress Factors Health changes  Advance Directives (For Healthcare)  Does Patient Have a Medical Advance Directive? No  Does patient want to make changes to medical advance directive? No - Patient declined  Would patient like information on creating a medical advance directive? Yes (Inpatient - patient defers creating a medical advance directive at this time)  [place]  Does Patient Have a Mental Health Advance Directive? No  Would patient like information on creating a mental health advance directive? No - Patient declined    The patient and I are familiar with one another, I met her in the Cancer Unit of the hospital before. She is calm and pleased with services right now. She asked to pray, but not for her health as she does not think that her health will become any better. Instead she asked that we pray for her family, that the Cynthia Carson would help them during this time. We prayed and I mentioned the service offered by the [place] [facility] and told her and family that I would be performing rounds throughout the rest of the evening and would check back on her soon.

## 2016-10-16 NOTE — Telephone Encounter (Signed)
Reports she has coughed up blood 3 times this morning and is very SOB, states she has never been this SOB before. Denies fever.  Per Dr Rogue Bussing, go to ER. Patient advised to go to ER. She states she will call the ambulance to come get her and take her there as she is scared regarding her breathing.  I called ER to report to the charge nurse of her situation

## 2016-10-16 NOTE — H&P (Signed)
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 CT chest reveals total collapse of L lung with proximal airway cutoff and mediastinal shift to the left consistent with L main bronchus obstruction. There is also a very small filling defect in the RLL artery interpreted as a small PE. Pt is severely thrombocytopenic (and therefore not a candidate for anticoagulation). She is requiring high flow O2 to maintain SpO2 in adequate range. She is DNR  HISTORY OF PRESENT ILLNESS:   As above. Small cell cancer was first diagnosed in January of this year by R supraclavicular lymph node biopsy. She initially had widespread metastases. She had a good response to first line chemotherapy but had relapse in July and was started on palliative Topotecan Nov 20th. She has felt weak over the past few days but denies dyspnea until the day prior to admission and markedly worse dyspnea on the morning of admission. She denies CP, fever, purulent sputum, LE edema and calf tenderness. She and her husband have recently addressed her code status and she indicates that she wishes to forgo any heroic measures in the event of cardiac or respiratory arrest.    PAST MEDICAL HISTORY :  She  has a past medical history of Cancer of lung ([place]) (12/05/2015) and Diabetes mellitus without complication ([place]).  PAST SURGICAL HISTORY: She  has a past surgical history that includes Abdominal hysterectomy and Cardiac catheterization (N/A, 01/13/2016).  Allergies  Allergen Reactions  . Amoxicillin Other (See Comments)    Reaction:  Unknown   . Levofloxacin Other (See Comments)    Reaction:  Joint and muscle pain  Pt states that this medication made her unable to walk.     Current  Facility-Administered Medications on File Prior to Encounter  Medication  . alteplase (CATHFLO ACTIVASE) injection 2 mg  . heparin lock flush 100 unit/mL  . heparin lock flush 100 unit/mL  . heparin lock flush 100 unit/mL  . sodium chloride 0.9 % injection 10 mL  . sodium chloride flush (NS) 0.9 % injection 10 mL  . sodium chloride flush (NS) 0.9 % injection 10 mL  . sodium chloride flush (NS) 0.9 % injection 10 mL   Current Outpatient Prescriptions on File Prior to Encounter  Medication Sig  . ALPRAZolam (XANAX) 0.5 MG tablet Take 0.5 mg by mouth 3 (three) times daily as needed for anxiety.   . benzonatate (TESSALON) 200 MG capsule Take 1 capsule (200 mg total) by mouth 3 (three) times daily as needed for cough.  . chlorpheniramine-HYDROcodone (TUSSIONEX PENNKINETIC ER) 10-8 MG/5ML SUER Take 5 mLs by mouth every 12 (twelve) hours as needed for cough.  Marland Kitchen glimepiride (AMARYL) 2 MG tablet Take 2 mg by mouth daily with breakfast.   . latanoprost (XALATAN) 0.005 % ophthalmic solution Place 1 drop into both eyes at bedtime.  . lidocaine-prilocaine (EMLA) cream Apply 1 application topically as needed.  . magic mouthwash w/lidocaine SOLN Take 5 mLs by mouth 4 (four) times daily. 80 ml viscous lidocaine 2%, 80 ml Mylanta, 80 ml Diphenhydramine 12.5 mg/5 ml Elixir, 80 ml Nystatin 100,000 Unit suspension, 80 ml Prednisolone 15 mg/12m, 80 ml Distilled Water. Sig: Swish/Swallow 5-10 ml four times a day as needed. Dispense 480 ml. 3RFs  . omeprazole (  PRILOSEC) 20 MG capsule Take 20 mg by mouth at bedtime.   . prochlorperazine (COMPAZINE) 10 MG tablet Take 1 tablet (10 mg total) by mouth every 6 (six) hours as needed for nausea or vomiting.  . sitaGLIPtin (JANUVIA) 100 MG tablet Take 100 mg by mouth daily.  Marland Kitchen HYDROcodone-acetaminophen (NORCO/VICODIN) 5-325 MG tablet Take 1 tablet by mouth every 8 (eight) hours as needed for moderate pain. (Patient not taking: Reported on 10/16/2016)    FAMILY HISTORY:   Her has no family status information on file.    SOCIAL HISTORY: She  reports that she quit smoking about 4 years ago. Her smoking use included Cigarettes. She has a 49.00 pack-year smoking history. She has never used smokeless tobacco. She reports that she does not drink alcohol or use drugs.  REVIEW OF SYSTEMS:   As per HPI. Also + for anorexia and 25 # weight loss since January. Otherwise negative. Denies N/V/D, dysuira  SUBJECTIVE:    VITAL SIGNS: BP 132/82   Pulse 100   Temp 98.2 F (36.8 C) (Oral)   Resp 16   Ht '5\' 5"'$  (1.651 m)   Wt 123 lb (55.8 kg)   SpO2 98%   BMI 20.47 kg/m   HEMODYNAMICS:    VENTILATOR SETTINGS:    INTAKE / OUTPUT: No intake/output data recorded.  PHYSICAL EXAMINATION: General:  Frail, no overt increase in WOB HEENT: Alopecia, otherwise NAD Neck: no JVD noted Chest: R sided portacath Lungs: absent BS on L, normal BS on R, dull to perc on L, no wheezes Cardiovascular: Occasional extrasystoles, no M noted Abdomen: Soft, NT +BS Ext: no C/C/E Neuro: CNs intact, motor/sens grossly intact Skin: No lesions noted   LABS:  BMET  Recent Labs Lab 10/16/16 1025  NA 135  K 3.3*  CL 100*  CO2 24  BUN 12  CREATININE 0.87  GLUCOSE 138*    Electrolytes  Recent Labs Lab 10/16/16 1025  CALCIUM 9.2    CBC  Recent Labs Lab 10/16/16 1025  WBC 1.6*  HGB 7.5*  HCT 21.9*  PLT 42*    Coag's  Recent Labs Lab 10/16/16 1025  APTT 60*  INR 1.04    Sepsis Markers  Recent Labs Lab 10/16/16 1139  LATICACIDVEN 1.0    ABG No results for input(s): PHART, PCO2ART, PO2ART in the last 168 hours.  Liver Enzymes No results for input(s): AST, ALT, ALKPHOS, BILITOT, ALBUMIN in the last 168 hours.  Cardiac Enzymes  Recent Labs Lab 10/16/16 1025  TROPONINI <0.03    Glucose No results for input(s): GLUCAP in the last 168 hours.  Imaging Ct Angio Chest Pe W And/or Wo Contrast  Result Date: 10/16/2016 CLINICAL DATA:   Patient with history of lung cancer on chemotherapy. Hemoptysis. Shortness of breath. EXAM: CT ANGIOGRAPHY CHEST WITH CONTRAST TECHNIQUE: Multidetector CT imaging of the chest was performed using the standard protocol during bolus administration of intravenous contrast. Multiplanar CT image reconstructions and MIPs were obtained to evaluate the vascular anatomy. CONTRAST:  75 cc Isovue 370 COMPARISON:  Chest CT 08/22/2016. FINDINGS: Cardiovascular: There is leftward shift of the heart and mediastinum. Interval development of a moderate pericardial effusion. Normal heart size. Adequate opacification of the pulmonary arterial system. Small filling defect within a right lower lobe segmental and subsegmental pulmonary artery (image 165; series 5). Mediastinum/Nodes: Significant interval increase in size of multiple mediastinal lymph nodes including a 6.2 x 3.7 cm subcarinal nodal mass (image 43; series 4), previously 3.1 x 2.6 cm. Left  hilar nodal mass measures 4.9 x 3.5 cm (image 38; series 4), previously 2.0 x 1.8 cm. Prevascular lymph node measures 4.1 x 2.6 cm (image 30; series 4), previously 3.4 x 2.6 cm. Interval decrease in size of large right peritracheal mass measuring 3.4 x 2.1 cm (image 8; series 4), previously 4.3 x 3.8 cm. Lungs/Pleura: There is near total collapse of the left lung resulting in volume loss and leftward mediastinal shift. There is abrupt cut off and non aeration of the left mainstem bronchus and more distal bronchi. Soft tissue is demonstrated within the left mainstem bronchus. Small left pleural effusion. No pneumothorax. The right lung is clear. Upper Abdomen: Interval enlargement of upper abdominal soft tissue mass measuring 5.3 x 3.7 cm (image 83; series 4), previously 2.2 x 2.1 cm. Musculoskeletal: No aggressive or acute appearing osseous lesions. Review of the MIP images confirms the above findings. IMPRESSION: Small right lower lobe acute pulmonary embolus. Marked interval increase in  size of mediastinal and left hilar adenopathy compatible with disease progression. Additionally there is an enlarging soft tissue mass within the upper abdomen compatible with disease progression. Complete opacification of the left mainstem bronchus and more distal bronchi which may be filled with mucus and/or tumor. This results in complete atelectasis of the left lung with leftward mediastinal shift. Small left pleural effusion. New moderate pericardial effusion. Interval decrease in size of bulky nodal mass within the right supraclavicular region. These results were called by telephone at the time of interpretation on 10/16/2016 at 12:44 pm to Dr. Lenise Arena , who verbally acknowledged these results. Electronically Signed   By: Lovey Newcomer M.D.   On: 10/16/2016 12:55   Dg Chest Port 1 View  Result Date: 10/16/2016 CLINICAL DATA:  65 year old female with lung cancer. Shortness of breath prior to receiving chemotherapy. Initial encounter. EXAM: PORTABLE CHEST 1 VIEW COMPARISON:  08/22/2016 chest CT.  08/14/2026 chest x-ray. FINDINGS: Almost complete consolidation left lung with mediastinal shift to left suggesting atelectasis. This may be caused by malignancy causing obstruction of the bronchus. Mucous plug could cause a similar appearance. Post obstructive infiltrate not excluded. Right MediPort catheter tip distal superior vena cava level. No pneumothorax. No acute bony abnormality. IMPRESSION: Almost complete consolidation left lung with mediastinal shift to left suggesting atelectasis. This may be caused by malignancy causing obstruction of the bronchus. Mucous plug could cause a similar appearance. Post obstructive infiltrate may also be present. Electronically Signed   By: Genia Del M.D.   On: 10/16/2016 10:39     ASSESSMENT: 1) Metastatic small cell ca with extensive intra-thoracic tumor burden 2) L lung collapse, leftward mediastinal shift, small L effusion, airway cutoff - all of this  is highly suggestive of L main bronchus obstruction, likely due to tumor with both extrinsic compression and likely endobronchial component.  3) Hemoptysis - likely due to endobronchial tumor 4) Possible small RLL PE 5) Pancytopenia  DISCUSSION: There are very limited options here and probably nothing to offer that is going to make a substantial impact on outcome. We could consider bronchoscopy to confirm L main endobronchial tumor if radiation therapy might be an option. However, bronchoscopy would be a very high risk endeavor in her given her tenuous respiratory status (and thrombocytopenia). Alternatively, we could consider external beam radiation without bronchoscopy. However, she seems less than enthusiastic about undergoing more radiation. There are no reasonable options right now for treatment of (possible) PE.  PLAN: 1) Admit to SDU 2) DNR/DNI 3) One unit PRBCs  ordered by ER MD 4) Oxygen therapy to maintain SpO2 > 90% 5) PRN BiPAP 6) PRN alprazolam 7) PRN oxycodone 8) Oncology consultation to consider if there are any other reasonable treatment options 9) Palliative Care consultation to address goals of care 10) If hemoptysis recurs and is severe, consider platelet transfusion 11) I have discussed with Dr Astrid Divine of [facility]. They will assume primary duties in the morning of 11/29  All of the above has been discussed in detail with the patient and her husband    Merton Border, MD PCCM service Mobile [phone] Pager [phone]  10/16/2016, 2:48 PM

## 2016-10-16 NOTE — Telephone Encounter (Signed)
Spoke to Dr.Simond/ Dr.Vachhani- re: overall poor prognosis/ would recommend palliative care. Will see pt tomorrow. Thx

## 2016-10-16 NOTE — Progress Notes (Signed)
The Nurse in the ED paged [facility] and asked him to visit with the Pt in ED Rm 06, who had expressed that she wanted to see a Chaplain. [place] visited the Pt. The Pt was in the room with her husband. Pt requested for Advanced Directives, [place] provided the brochure and education, then he brought the witnesses and notary, and the AD was completed.    10/16/16 1500  Clinical Encounter Type  Visited With Patient;Patient and family together  Visit Type Initial;Follow-up;Spiritual support;ED;Trauma;Other (Comment)  Referral From Nurse  Consult/Referral To Chaplain;Other (Comment)  Spiritual Encounters  Spiritual Needs Brochure;Other (Comment)  Advance Directives (For Healthcare)  Does Patient Have a Medical Advance Directive? No  Does patient want to make changes to medical advance directive? Yes (Inpatient - patient requests chaplain consult to change a medical advance directive)  Would patient like information on creating a medical advance directive? Yes (Inpatient - patient requests chaplain consult to create a medical advance directive)  [place] Directives  Does Patient Have a Mental Health Advance Directive? No  Would patient like information on creating a mental health advance directive? No - Patient declined

## 2016-10-16 NOTE — Progress Notes (Signed)
Dr.Simonds called- Pt with hemoptysis and some cancer- admitted to step down unit with NRBM, will be stable to transfer to hospitalist service from tomorrow.

## 2016-10-16 NOTE — ED Notes (Signed)
Patient transported to CT 

## 2016-10-16 NOTE — ED Provider Notes (Signed)
[facility] Emergency Department Provider Note        Time seen: ----------------------------------------- 10:14 AM on 10/16/2016 -----------------------------------------    I have reviewed the triage vital signs and the nursing notes.   HISTORY   Chief Complaint No chief complaint on file.    HPI Cynthia Carson is a 65 y.o. female who presents by EMS from home with a history of lung cancer. Reportedly she received chemotherapy yesterday most posterior chemotherapy today as well. She woke up around 3 AM coughing with hemoptysis and felt short of breath. Patient states oxygen levels were 72% on room air. She was placed on nonrebreather. She arrives alert and oriented but does feel short of breath. She has received a blood transfusion recently.   Past Medical History:  Diagnosis Date  . Cancer of lung ([place]) 12/05/2015  . Diabetes mellitus without complication [facility])     Patient Active Problem List   Diagnosis Date Noted  . Anemia due to antineoplastic chemotherapy 09/10/2016  . Brain metastasis ([place]) 05/31/2016  . Cough 05/17/2016  . Cancer of lower lobe of left lung ([place]) 12/05/2015  . Mediastinal adenopathy 11/24/2015  . Supraclavicular adenopathy   . COPD (chronic obstructive pulmonary disease) ([place]) 11/23/2015  . Dysarthria 11/23/2015  . Hypokalemia 11/23/2015    Past Surgical History:  Procedure Laterality Date  . ABDOMINAL HYSTERECTOMY    . PERIPHERAL VASCULAR CATHETERIZATION N/A 01/13/2016   Procedure: Glori Luis Cath Insertion;  Surgeon: Katha Cabal, MD;  Location: [facility];  Service: Cardiovascular;  Laterality: N/A;    Allergies Amoxicillin and Levofloxacin  Social History Social History  Substance Use Topics  . Smoking status: Former Smoker    Packs/day: 1.00    Years: 49.00    Types: Cigarettes    Quit date: 11/19/2011  . Smokeless tobacco: Never Used  . Alcohol use No   Review of Systems Constitutional:  Negative for fever. Cardiovascular: Negative for chest pain. Respiratory: Positive shortness of breath Gastrointestinal: Negative for abdominal pain, vomiting and diarrhea. Genitourinary: Negative for dysuria. Musculoskeletal: Negative for back pain. Skin: Negative for rash. Neurological: Negative for headaches, positive for weakness 10-point ROS otherwise negative.  ____________________________________________   PHYSICAL EXAM:  VITAL SIGNS: ED Triage Vitals  Enc Vitals Group     BP      Pulse      Resp      Temp      Temp src      SpO2      Weight      Height      Head Circumference      Peak Flow      Pain Score      Pain Loc      Pain Edu?      Excl. in [place]?     Constitutional: Alert and oriented. No acute distress Eyes: Conjunctivae are pale. PERRL. Normal extraocular movements. ENT   Head: Normocephalic and atraumatic.   Nose: No congestion/rhinnorhea.   Mouth/Throat: Mucous membranes are moist.   Neck: No stridor. Cardiovascular: Normal rate, regular rhythm. No murmurs, rubs, or gallops. Respiratory: Normal respiratory effort with markedly diminished breath sounds on the left Gastrointestinal: Soft and nontender. Normal bowel sounds Musculoskeletal: Nontender with normal range of motion in all extremities. No lower extremity tenderness nor edema. Neurologic:  Normal speech and language. No gross focal neurologic deficits are appreciated.  Skin:  Skin is warm, dry and intact. Pallor is noted Psychiatric: Mood and affect are normal. Speech  and behavior are normal.  ____________________________________________  EKG: Interpreted by me. Sinus tachycardia with a rate of 106 bpm, normal PR interval, normal QRS, normal QT, normal axis.  ____________________________________________  ED COURSE:  Pertinent labs & imaging results that were available during my care of the patient were reviewed by me and considered in my medical decision making (see chart  for details). Clinical Course   Patient presents to the ER with weakness and dyspnea. Multiple etiologies are possible. We will assess with labs and imaging. She may need CT imaging and/or blood transfusion.  Procedures ____________________________________________   LABS (pertinent positives/negatives)  Labs Reviewed  CBC WITH DIFFERENTIAL/PLATELET - Abnormal; Notable for the following:       Result Value   WBC 1.6 (*)    RBC 2.44 (*)    Hemoglobin 7.5 (*)    HCT 21.9 (*)    RDW 18.3 (*)    Platelets 42 (*)    Neutro Abs 1.0 (*)    Lymphs Abs 0.4 (*)    Monocytes Absolute 0.1 (*)    All other components within normal limits  BASIC METABOLIC PANEL - Abnormal; Notable for the following:    Potassium 3.3 (*)    Chloride 100 (*)    Glucose, Bld 138 (*)    All other components within normal limits  APTT - Abnormal; Notable for the following:    aPTT 60 (*)    All other components within normal limits  CULTURE, BLOOD (ROUTINE X 2)  CULTURE, BLOOD (ROUTINE X 2)  BRAIN NATRIURETIC PEPTIDE  TROPONIN I  PROTIME-INR  LACTIC ACID, PLASMA  LACTIC ACID, PLASMA  TYPE AND SCREEN  PREPARE RBC (CROSSMATCH)   CRITICAL CARE Performed by: Earleen Newport   Total critical care time: 30 minutes  Critical care time was exclusive of separately billable procedures and treating other patients.  Critical care was necessary to treat or prevent imminent or life-threatening deterioration.  Critical care was time spent personally by me on the following activities: development of treatment plan with patient and/or surrogate as well as nursing, discussions with consultants, evaluation of patient's response to treatment, examination of patient, obtaining history from patient or surrogate, ordering and performing treatments and interventions, ordering and review of laboratory studies, ordering and review of radiographic studies, pulse oximetry and re-evaluation of patient's  condition.  RADIOLOGY Images were viewed by me  Chest x-ray IMPRESSION: Almost complete consolidation left lung with mediastinal shift to left suggesting atelectasis. This may be caused by malignancy causing obstruction of the bronchus. Mucous plug could cause a similar appearance. Post obstructive infiltrate may also be present. CT angiogram of the chest IMPRESSION: Small right lower lobe acute pulmonary embolus.  Marked interval increase in size of mediastinal and left hilar adenopathy compatible with disease progression. Additionally there is an enlarging soft tissue mass within the upper abdomen compatible with disease progression.  Complete opacification of the left mainstem bronchus and more distal bronchi which may be filled with mucus and/or tumor. This results in complete atelectasis of the left lung with leftward mediastinal shift.  Small left pleural effusion.  New moderate pericardial effusion.  Interval decrease in size of bulky nodal mass within the right supraclavicular region.  These results were called by telephone at the time of interpretation on 10/16/2016 at 12:44 pm to Dr. Lenise Arena , who verbally acknowledged these results. ____________________________________________  FINAL ASSESSMENT AND PLAN  Dyspnea, weakness, lung cancer  Plan: Patient with labs and imaging as dictated above. Patient  with markedly worsening appearance of the left chest with effusion and collapse of left mainstem bronchus. I have discussed the case with oncology as well as critical care who will evaluate the patient in the ER. She is currently on nonrebreather, we have sent blood cultures and ordered vancomycin and Zosyn as she is likely neutropenic and has a postobstructive pneumonia. Patient likely with significant worsening in her cancer as well. Overall poor prognosis is grave.   Earleen Newport, MD   Note: This dictation was prepared with Dragon  dictation. Any transcriptional errors that result from this process are unintentional    Earleen Newport, MD 10/16/16 1302

## 2016-10-17 DIAGNOSIS — J9620 Acute and chronic respiratory failure, unspecified whether with hypoxia or hypercapnia: Secondary | ICD-10-CM

## 2016-10-17 DIAGNOSIS — C799 Secondary malignant neoplasm of unspecified site: Secondary | ICD-10-CM

## 2016-10-17 DIAGNOSIS — C3432 Malignant neoplasm of lower lobe, left bronchus or lung: Secondary | ICD-10-CM

## 2016-10-17 DIAGNOSIS — Z79899 Other long term (current) drug therapy: Secondary | ICD-10-CM

## 2016-10-17 DIAGNOSIS — C779 Secondary and unspecified malignant neoplasm of lymph node, unspecified: Secondary | ICD-10-CM

## 2016-10-17 DIAGNOSIS — Z90710 Acquired absence of both cervix and uterus: Secondary | ICD-10-CM

## 2016-10-17 DIAGNOSIS — Z515 Encounter for palliative care: Secondary | ICD-10-CM

## 2016-10-17 DIAGNOSIS — C3492 Malignant neoplasm of unspecified part of left bronchus or lung: Secondary | ICD-10-CM

## 2016-10-17 DIAGNOSIS — Z66 Do not resuscitate: Secondary | ICD-10-CM

## 2016-10-17 DIAGNOSIS — C7970 Secondary malignant neoplasm of unspecified adrenal gland: Secondary | ICD-10-CM

## 2016-10-17 DIAGNOSIS — C7900 Secondary malignant neoplasm of unspecified kidney and renal pelvis: Secondary | ICD-10-CM

## 2016-10-17 DIAGNOSIS — Z9221 Personal history of antineoplastic chemotherapy: Secondary | ICD-10-CM

## 2016-10-17 DIAGNOSIS — I2699 Other pulmonary embolism without acute cor pulmonale: Secondary | ICD-10-CM

## 2016-10-17 DIAGNOSIS — C7931 Secondary malignant neoplasm of brain: Secondary | ICD-10-CM

## 2016-10-17 DIAGNOSIS — Z923 Personal history of irradiation: Secondary | ICD-10-CM

## 2016-10-17 DIAGNOSIS — Z7952 Long term (current) use of systemic steroids: Secondary | ICD-10-CM

## 2016-10-17 DIAGNOSIS — J9811 Atelectasis: Secondary | ICD-10-CM

## 2016-10-17 DIAGNOSIS — Z87891 Personal history of nicotine dependence: Secondary | ICD-10-CM

## 2016-10-17 DIAGNOSIS — E119 Type 2 diabetes mellitus without complications: Secondary | ICD-10-CM

## 2016-10-17 DIAGNOSIS — Z7951 Long term (current) use of inhaled steroids: Secondary | ICD-10-CM

## 2016-10-17 LAB — CBC
HEMATOCRIT: 23.8 % — AB (ref 35.0–47.0)
Hemoglobin: 8.3 g/dL — ABNORMAL LOW (ref 12.0–16.0)
MCH: 31 pg (ref 26.0–34.0)
MCHC: 34.9 g/dL (ref 32.0–36.0)
MCV: 88.6 fL (ref 80.0–100.0)
Platelets: 29 10*3/uL — CL (ref 150–440)
RBC: 2.68 MIL/uL — ABNORMAL LOW (ref 3.80–5.20)
RDW: 17.1 % — AB (ref 11.5–14.5)
WBC: 1.2 10*3/uL — CL (ref 3.6–11.0)

## 2016-10-17 LAB — TYPE AND SCREEN
ABO/RH(D): O POS
ANTIBODY SCREEN: POSITIVE
UNIT DIVISION: 0

## 2016-10-17 LAB — BASIC METABOLIC PANEL
Anion gap: 8 (ref 5–15)
BUN: 9 mg/dL (ref 6–20)
CALCIUM: 9.2 mg/dL (ref 8.9–10.3)
CHLORIDE: 99 mmol/L — AB (ref 101–111)
CO2: 28 mmol/L (ref 22–32)
CREATININE: 0.65 mg/dL (ref 0.44–1.00)
GFR calc non Af Amer: >60 mL/min (ref >60)
GLUCOSE: 95 mg/dL (ref 65–99)
Potassium: 3.4 mmol/L — ABNORMAL LOW (ref 3.5–5.1)
Sodium: 135 mmol/L (ref 135–145)

## 2016-10-17 LAB — GLUCOSE, CAPILLARY
Glucose-Capillary: 118 mg/dL — ABNORMAL HIGH (ref 65–99)
Glucose-Capillary: 112 mg/dL — ABNORMAL HIGH (ref 65–99)

## 2016-10-17 MED ORDER — MORPHINE SULFATE (CONCENTRATE) 10 MG/0.5ML PO SOLN: 5.0000 mg | ORAL | Status: DC | PRN

## 2016-10-17 MED ORDER — IPRATROPIUM-ALBUTEROL 0.5-2.5 (3) MG/3ML IN SOLN
3.0000 mL | RESPIRATORY_TRACT | Status: DC
  Administered 2016-10-17: 3 mL via RESPIRATORY_TRACT
  Filled 2016-10-17 (×3): qty 3

## 2016-10-17 MED ORDER — PREDNISONE 20 MG PO TABS
40.0000 mg | ORAL_TABLET | Freq: Every day | ORAL | Status: DC
  Administered 2016-10-17: 40 mg via ORAL
  Filled 2016-10-17: qty 2

## 2016-10-17 MED ORDER — BUDESONIDE 0.25 MG/2ML IN SUSP
0.2500 mg | Freq: Two times a day (BID) | RESPIRATORY_TRACT | Status: DC
  Administered 2016-10-17: 0.25 mg via RESPIRATORY_TRACT
  Filled 2016-10-17 (×2): qty 2

## 2016-10-17 MED ORDER — LATANOPROST 0.005 % OP SOLN
1.0000 [drp] | Freq: Every day | OPHTHALMIC | Status: DC
  Administered 2016-10-18: 21:00:00 1 [drp] via OPHTHALMIC
  Filled 2016-10-17: qty 2.5

## 2016-10-17 NOTE — Progress Notes (Signed)
Cynthia Carson at [place] NAME: Kailly Richoux    MR#:  [id number]  DATE OF BIRTH:  07/31/1951  SUBJECTIVE:  CHIEF COMPLAINT:   Chief Complaint  Patient presents with  . Shortness of Breath  . Hemoptysis  requests just comfort care only. Nebs make her cough more so refuses. Family at bedside REVIEW OF SYSTEMS:  Review of Systems  Unable to perform ROS: Critical illness   DRUG ALLERGIES:   Allergies  Allergen Reactions  . Amoxicillin Other (See Comments)    Reaction:  Unknown   . Levofloxacin Other (See Comments)    Reaction:  Joint and muscle pain  Pt states that this medication made her unable to walk.    VITALS:  Blood pressure 128/78, pulse (!) 104, temperature 97.8 F (36.6 C), temperature source Oral, resp. rate 20, height '5\' 5"'$  (1.651 m), weight 55.8 kg (123 lb), SpO2 100 %. PHYSICAL EXAMINATION:  Physical Exam  Constitutional: She is oriented to person, place, and time. She appears to be writhing in pain and malnourished. She appears unhealthy. She appears cachectic. She has a sickly appearance. She appears distressed.  HENT:  Head: Normocephalic and atraumatic.  Eyes: Conjunctivae and EOM are normal. Pupils are equal, round, and reactive to light.  Neck: Normal range of motion. Neck supple. No tracheal deviation present. No thyromegaly present.  Cardiovascular: Normal rate, regular rhythm and normal heart sounds.   Pulmonary/Chest: Accessory muscle usage present. Tachypnea noted. She is in respiratory distress. She has no wheezes. She has rhonchi. She exhibits no tenderness.  Abdominal: Soft. Bowel sounds are normal. She exhibits no distension. There is no tenderness.  Musculoskeletal: Normal range of motion.  Neurological: She is alert and oriented to person, place, and time. No cranial nerve deficit.  Skin: Skin is warm and dry. No rash noted.  Psychiatric: Mood and affect normal.   LABORATORY PANEL:   CBC  Recent  Labs Lab 10/17/16 0522  WBC 1.2*  HGB 8.3*  HCT 23.8*  PLT 29*   ------------------------------------------------------------------------------------------------------------------ Chemistries   Recent Labs Lab 10/17/16 0522  NA 135  K 3.4*  CL 99*  CO2 28  GLUCOSE 95  BUN 9  CREATININE 0.65  CALCIUM 9.2   RADIOLOGY:  No results found. 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  - currently on NRB mask but choosing comfort care  # Metastatic small cell lung cancer- progressive malignancy on chemotherapy; Given the rapidly progressive malignancy/poor tolerance to chemotherapy- Onco and Pulmo recommend palliative care evaluation/hospice.  * L lung collapse, leftward mediastinal shift, small L effusion, airway cutoff - all of this is highly suggestive of L main bronchus obstruction, likely due to tumor with both extrinsic compression and likely endobronchial component. - comfort care for now  * Hemoptysis - likely due to endobronchial tumor * Possible small RLL PE * Pancytopenia  * Palliative Care consultation to address goals of care    Overall Prognosis is very poor, recommend Hospice Care and referral in am   All the records are reviewed and case discussed with Care Management/Social Worker. Management plans discussed with the patient, family and they are in agreement.  CODE STATUS: DNR, COMFORT CARE  TOTAL TIME TAKING CARE OF THIS PATIENT: 35 minutes.   More  than 50% of the time was spent in counseling/coordination of care: YES  POSSIBLE D/C IN AM, DEPENDING ON CLINICAL CONDITION. AND HOSPICE SCREENING   Max Sane M.D on 10/17/2016 at 7:12 PM  Between 7am to 6pm - Pager - [phone]  After 6pm go to  [email or web address] - Proofreader  [facility]  Office  [phone]  CC: Primary care physician; Marden Noble, MD  Note: This dictation was prepared with Dragon dictation along with smaller phrase technology. Any transcriptional errors that result from this process are unintentional.

## 2016-10-17 NOTE — H&P (Signed)
PULMONARY / CRITICAL CARE MEDICINE   Name: Cynthia Carson MRN: [id number] DOB: 05-Jul-1951    ADMISSION DATE:  10/16/2016   PT PROFILE:  75 F with recurrent metastatic small cell ca of lung ([place]) awoke in the ealy morning of admission and noted increased DOE and new onset hemoptysis. CT chest reveals total collapse of L lung with proximal airway cutoff and mediastinal shift to the left consistent with L main bronchus obstruction. There is also a very small filling defect in the RLL artery interpreted as a small PE. Pt is severely thrombocytopenic (and therefore not a candidate for anticoagulation). She is requiring high flow O2 to maintain SpO2 in adequate range. She is DNR  HISTORY OF PRESENT ILLNESS:   As above. Small cell cancer was first diagnosed in January of this year by R supraclavicular lymph node biopsy. She initially had widespread metastases. She had a good response to first line chemotherapy but had relapse in July and was started on palliative Topotecan Nov 20th. She has felt weak over the past few days but denies dyspnea until the day prior to admission and markedly worse dyspnea on the morning of admission. She denies CP, fever, purulent sputum, LE edema and calf tenderness. She and her husband have recently addressed her code status and she indicates that she wishes to forgo any heroic measures in the event of cardiac or respiratory arrest.   SUBJECTIVE:  Alert and awake, feels better with face mask oxygen therapy   VITAL SIGNS: BP 128/75   Pulse 100   Temp 98.5 F (36.9 C)   Resp 20   Ht '5\' 5"'$  (1.651 m)   Wt 123 lb (55.8 kg)   SpO2 93%   BMI 20.47 kg/m   HEMODYNAMICS:    VENTILATOR SETTINGS: FiO2 (%):  [35 %-100 %] 35 %  INTAKE / OUTPUT: I/O last 3 completed shifts: In: 569.2 [I.V.:569.2] Out: 1300 [Urine:1300]  PHYSICAL EXAMINATION: General:  Frail, no overt increase in WOB HEENT: Alopecia, otherwise NAD Neck: no JVD noted Chest: R sided  portacath Lungs: absent BS on L, normal BS on R, dull to perc on L, no wheezes Cardiovascular: Occasional extrasystoles, no M noted Abdomen: Soft, NT +BS Ext: no C/C/E Neuro: CNs intact, motor/sens grossly intact Skin: No lesions noted   LABS:  BMET  Recent Labs Lab 10/16/16 1025 10/17/16 0522  NA 135 135  K 3.3* 3.4*  CL 100* 99*  CO2 24 28  BUN 12 9  CREATININE 0.87 0.65  GLUCOSE 138* 95    Electrolytes  Recent Labs Lab 10/16/16 1025 10/17/16 0522  CALCIUM 9.2 9.2    CBC  Recent Labs Lab 10/16/16 1025 10/17/16 0522  WBC 1.6* 1.2*  HGB 7.5* 8.3*  HCT 21.9* 23.8*  PLT 42* 29*    Coag's  Recent Labs Lab 10/16/16 1025  APTT 60*  INR 1.04    Sepsis Markers  Recent Labs Lab 10/16/16 1139  LATICACIDVEN 1.0    ABG No results for input(s): PHART, PCO2ART, PO2ART in the last 168 hours.  Liver Enzymes No results for input(s): AST, ALT, ALKPHOS, BILITOT, ALBUMIN in the last 168 hours.  Cardiac Enzymes  Recent Labs Lab 10/16/16 1025  TROPONINI <0.03    Glucose  Recent Labs Lab 10/16/16 1623 10/17/16 0647  GLUCAP 133* 118*    Imaging Ct Angio Chest Pe W And/or Wo Contrast  Result Date: 10/16/2016 CLINICAL DATA:  Patient with history of lung cancer on chemotherapy. Hemoptysis. Shortness of breath. EXAM: CT  ANGIOGRAPHY CHEST WITH CONTRAST TECHNIQUE: Multidetector CT imaging of the chest was performed using the standard protocol during bolus administration of intravenous contrast. Multiplanar CT image reconstructions and MIPs were obtained to evaluate the vascular anatomy. CONTRAST:  75 cc Isovue 370 COMPARISON:  Chest CT 08/22/2016. FINDINGS: Cardiovascular: There is leftward shift of the heart and mediastinum. Interval development of a moderate pericardial effusion. Normal heart size. Adequate opacification of the pulmonary arterial system. Small filling defect within a right lower lobe segmental and subsegmental pulmonary artery (image  165; series 5). Mediastinum/Nodes: Significant interval increase in size of multiple mediastinal lymph nodes including a 6.2 x 3.7 cm subcarinal nodal mass (image 43; series 4), previously 3.1 x 2.6 cm. Left hilar nodal mass measures 4.9 x 3.5 cm (image 38; series 4), previously 2.0 x 1.8 cm. Prevascular lymph node measures 4.1 x 2.6 cm (image 30; series 4), previously 3.4 x 2.6 cm. Interval decrease in size of large right peritracheal mass measuring 3.4 x 2.1 cm (image 8; series 4), previously 4.3 x 3.8 cm. Lungs/Pleura: There is near total collapse of the left lung resulting in volume loss and leftward mediastinal shift. There is abrupt cut off and non aeration of the left mainstem bronchus and more distal bronchi. Soft tissue is demonstrated within the left mainstem bronchus. Small left pleural effusion. No pneumothorax. The right lung is clear. Upper Abdomen: Interval enlargement of upper abdominal soft tissue mass measuring 5.3 x 3.7 cm (image 83; series 4), previously 2.2 x 2.1 cm. Musculoskeletal: No aggressive or acute appearing osseous lesions. Review of the MIP images confirms the above findings. IMPRESSION: Small right lower lobe acute pulmonary embolus. Marked interval increase in size of mediastinal and left hilar adenopathy compatible with disease progression. Additionally there is an enlarging soft tissue mass within the upper abdomen compatible with disease progression. Complete opacification of the left mainstem bronchus and more distal bronchi which may be filled with mucus and/or tumor. This results in complete atelectasis of the left lung with leftward mediastinal shift. Small left pleural effusion. New moderate pericardial effusion. Interval decrease in size of bulky nodal mass within the right supraclavicular region. These results were called by telephone at the time of interpretation on 10/16/2016 at 12:44 pm to Dr. Lenise Arena , who verbally acknowledged these results. Electronically  Signed   By: Lovey Newcomer M.D.   On: 10/16/2016 12:55     ASSESSMENT: 1) Metastatic small cell ca with extensive intra-thoracic tumor burden 2) L lung collapse, leftward mediastinal shift, small L effusion, airway cutoff - all of this is highly suggestive of L main bronchus obstruction, likely due to tumor with both extrinsic compression and likely endobronchial component.  3) Hemoptysis - likely due to endobronchial tumor 4) Possible small RLL PE 5) Pancytopenia  DISCUSSION: There are very limited options here and probably nothing to offer that is going to make a substantial impact on outcome. We could consider bronchoscopy to confirm L main endobronchial tumor if radiation therapy might be an option. However, bronchoscopy would be a very high risk endeavor in her given her tenuous respiratory status (and thrombocytopenia). Alternatively, we could consider external beam radiation without bronchoscopy. However, she seems less than enthusiastic about undergoing more radiation.   PLAN: 1)OK to transfer to gen med floor 2) DNR/DNI 3) Oxygen therapy to maintain SpO2 > 90% 4) PRN alprazolam 5) PRN oxycodone 6) Oncology consultation appreciated 7) Palliative Care consultation to address goals of care 8)start oral prednisone 9)start dounebs and pulmicort nebs  Overall Prognosis is very poor, recommend Hospice Care and referral Palliative care team Consulted  I have personally obtained a history, examined the patient, evaluated Pertinent laboratory and RadioGraphic/imaging results, and  formulated the assessment and plan  The Patient requires high complexity decision making for assessment and support, frequent evaluation and titration of therapies.  Patient satisfied with Plan of action and management. All questions answered  Corrin Parker, M.D.  [facility]  Medical Director [place] Director [facility] Building Cardio-Pulmonary Department

## 2016-10-17 NOTE — Progress Notes (Signed)
Spoke with Dr Jari Sportsman regarding patients platelet counts and wbc. At this point no additional orders. Pending palliative care consult.

## 2016-10-17 NOTE — Progress Notes (Signed)
Pt is in no distress. Pt prefers [facility] over [place]. Pt is refusing Bipap and nebulizer treatments.

## 2016-10-17 NOTE — Consult Note (Signed)
Consultation Note Date: 10/17/2016   Patient Name: Cynthia Carson  DOB: [birth year]  MRN: [id number]  Age / Sex: 65 y.o., female  PCP: Marden Noble, MD Referring Physician: Max Sane, MD  Reason for Consultation: Establishing goals of care  HPI/Patient Profile: 65 y.o. female  with past medical history of small cell cancer lung (follows Dr. Rogue Bussing) admitted on 10/16/2016 with increased DOE and new hemoptysis. CT chest shows collapse left lung with proximal airway cutoff and mediastinal shift to the left consistent with left main bronchus obstruction.   Clinical Assessment and Goals of Care: I met today with Ms. Eisel and her husband, daughter, and grandson. Ms. Guidry is very aware of her prognosis and is talking and trying to calm and reassure her daughter who just arrived from [place] a few minutes ago. Her daughter keeps saying that she isn't ready for her mother to die. Her husband, Ronalee Belts, is very supportive of her wishes.   Ms. Wan seems very prepared to pass and trying to help her family. Her main goal is to not suffer and she says she was very afraid when she had hemoptysis at home because she was home alone. She does not want to be afraid like that again. We discussed options as far as home with hospice vs hospice facility. She is leaning more towards hospice facility knowing that she will have nurses there ready to handle any situation that may arise to make sure she does not suffer in any way. She would like some more time to consider and discuss with her family.   She is currently fairly comfortable on Venti mask but prn roxanol ordered. Complains of pain in sacrum and scapula areas due to pressure on the bed - ordered mattress overlay for comfort. Appetite very poor and only ate a couple bites of lunch. Emotional support provided.   Primary Decision Maker NEXT OF KIN husband Ronalee Belts    SUMMARY OF RECOMMENDATIONS   - Comfort care focused care - Trying to decide on avenue of hospice (home vs facility)  Code Status/Advance Care Planning:  DNR   Symptom Management:   Dyspnea: Relieved with rest and venti mask currently. Low dose roxanol prn.   Pain: Continue oxycodone prn.   Palliative Prophylaxis:   Aspiration, Delirium Protocol, Frequent Pain Assessment and Turn Reposition  Additional Recommendations (Limitations, Scope, Preferences):  Full Comfort Care  Psycho-social/Spiritual:   Desire for further Chaplaincy support:yes  Additional Recommendations: Education on Hospice and Grief/Bereavement Support  Prognosis:   Likely 2-3 weeks with progressing symptomatic lung cancer.   Discharge Planning: Hospice facility vs home hospice.       Primary Diagnoses: Present on Admission: . Acute respiratory failure ([place])   I have reviewed the medical record, interviewed the patient and family, and examined the patient. The following aspects are pertinent.  Past Medical History:  Diagnosis Date  . Cancer of lung ([place]) 12/05/2015  . Diabetes mellitus without complication [facility])    Social History   Social History  . Marital status:  Married    Spouse name: N/A  . Number of children: N/A  . Years of education: N/A   Social History Main Topics  . Smoking status: Former Smoker    Packs/day: 1.00    Years: 49.00    Types: Cigarettes    Quit date: 11/19/2011  . Smokeless tobacco: Never Used  . Alcohol use No  . Drug use: No  . Sexual activity: Yes    Birth control/ protection: Surgical   Other Topics Concern  . None   Social History Narrative  . None   No family history on file. Scheduled Meds: . sodium chloride  10 mL/hr Intravenous Once  . budesonide (PULMICORT) nebulizer solution  0.25 mg Nebulization BID  . ipratropium-albuterol  3 mL Nebulization Q4H  . latanoprost  1 drop Both Eyes QHS  . mouth rinse  15 mL Mouth Rinse BID  .  pantoprazole  40 mg Oral Q1200  . predniSONE  40 mg Oral Q breakfast   Continuous Infusions: . lactated ringers with kcl 50 mL/hr at 10/16/16 1700   PRN Meds:.acetaminophen, ALPRAZolam, chlorpheniramine-HYDROcodone, ondansetron (ZOFRAN) IV, oxyCODONE Allergies  Allergen Reactions  . Amoxicillin Other (See Comments)    Reaction:  Unknown   . Levofloxacin Other (See Comments)    Reaction:  Joint and muscle pain  Pt states that this medication made her unable to walk.    Review of Systems  Constitutional: Positive for activity change, appetite change and fatigue.  Respiratory: Positive for cough and shortness of breath.        Hemoptysis  Neurological: Positive for weakness.    Physical Exam  Constitutional: She is oriented to person, place, and time. She appears well-developed.  HENT:  Head: Normocephalic and atraumatic.  Cardiovascular: Normal rate.   Pulmonary/Chest: No accessory muscle usage. Tachypnea noted. No respiratory distress.  Abdominal: Normal appearance.  Neurological: She is alert and oriented to person, place, and time.  Nursing note and vitals reviewed.   Vital Signs: BP 99/74   Pulse (!) 112   Temp 97.9 F (36.6 C)   Resp (!) 24   Ht _0  (1.651 m)   Wt 55.8 kg (123 lb)   SpO2 95%   BMI 20.47 kg/m  Pain Assessment: 0-10 POSS *See Group Information*: S-Acceptable,Sleep, easy to arouse Pain Score: 0-No pain   SpO2: SpO2: 95 % O2 Device:SpO2: 95 % O2 Flow Rate: .O2 Flow Rate (L/min): 6 L/min  IO: Intake/output summary:  Intake/Output Summary (Last 24 hours) at 10/17/16 1351 Last data filed at 10/17/16 0900  Gross per 24 hour  Intake           569.17 ml  Output             1600 ml  Net         -1030.83 ml    LBM:   Baseline Weight: Weight: 55.8 kg (123 lb) Most recent weight: Weight: 55.8 kg (123 lb)     Palliative Assessment/Data: PPS: 40%     Time In: 1230 Time Out: 1340 Time Total: 71mn Greater than 50%  of this time was spent  counseling and coordinating care related to the above assessment and plan.  Signed by: AVinie Sill NP Palliative Medicine Team Pager # [phone](M-F 8a-5p) Team Phone # [phone](Nights/Weekends)

## 2016-10-17 NOTE — Progress Notes (Signed)
Family Meeting Note  Advance Directive:yes  Today a meeting took place with the Patient and other family members including daughters.  Patient is unable to participate due [id number] capacity on NRB mask   The following clinical team members were present during this meeting:MD and RN  The following were discussed:Patient's diagnosis: , Patient's progosis: < 6 months and Goals for treatment: DNR  Additional follow-up to be provided: Palliative care - Hospice eval in am  Time spent during discussion:20 minutes  Max Sane, MD

## 2016-10-17 NOTE — Progress Notes (Signed)
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 Diagnosis in January of 2017. Dx-  right supraclavicular lymph node biopsy.  # T2 N3 M1 stage IV disease with metastases to the brain, kidney, and adrenal gland.  # S/p WBRT radiation therapy. (Feb of 2017;]  Startingchemotherapy December 26, 2015 with carboplatinum and VP-16.  # July 12th 2017- Recurrence/ progression- on CT- Carbo-[place] x4- Progression noted; s/p RT to Right neck  # NOV 20th 2017- Start Topotecan every 21 days.  --------------------------------------------------------------------------------------------------------------------------------------------- HISTORY OF PRESENTING ILLNESS:  Cynthia Carson 65 y.o.  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   CT scan showed progressive malignancy in the abdomen; and also collapse of the left upper lung and also worsened hilar and mediastinal adenopathy. CT also showed incidental small PE. Not on anti-cognition because of low platelets. Patient is currently in ICU; needing high flow nasal oxygen / nonrebreather.  Patient otherwise feels "comfortable"; she denies any pain. She obviously knows the poor prognosis.   ROS: A complete 10 point review of system is done which is negative except mentioned above in history of present illness  MEDICAL HISTORY:  Past  Medical History:  Diagnosis Date  . Cancer of lung ([place]) 12/05/2015  . Diabetes mellitus without complication ([place])     SURGICAL HISTORY: Past Surgical History:  Procedure Laterality Date  . ABDOMINAL HYSTERECTOMY    . PERIPHERAL VASCULAR CATHETERIZATION N/A 01/13/2016   Procedure: Glori Luis Cath Insertion;  Surgeon: Katha Cabal, MD;  Location: [facility];  Service: Cardiovascular;  Laterality: N/A;    SOCIAL HISTORY: Social History   Social History  . Marital status: Married    Spouse name: N/A  . Number of children: N/A  . Years of education: N/A   Occupational History  . Not on file.   Social History Main Topics  . Smoking status: Former Smoker    Packs/day: 1.00    Years: 49.00    Types: Cigarettes    Quit date: 11/19/2011  . Smokeless tobacco: Never Used  . Alcohol use No  . Drug use: No  . Sexual activity: Yes    Birth control/ protection: Surgical   Other Topics Concern  . Not on file   Social History Narrative  . No narrative on file    FAMILY HISTORY: No family history on file.  ALLERGIES:  is allergic to amoxicillin and levofloxacin.  MEDICATIONS:  Current Facility-Administered Medications  Medication Dose Route Frequency Provider Last Rate Last Dose  . 0.9 %  sodium chloride infusion  10 mL/hr Intravenous Once Earleen Newport, MD   Stopped at 10/16/16 1818  . acetaminophen (TYLENOL) tablet 650 mg  650 mg Oral Q4H PRN Wilhelmina Mcardle, MD      . ALPRAZolam Duanne Moron) tablet 0.5 mg  0.5 mg Oral TID PRN Wilhelmina Mcardle, MD   0.5 mg at 10/16/16 1705  . budesonide (PULMICORT) nebulizer solution 0.25 mg  0.25 mg Nebulization BID Flora Lipps, MD      . chlorpheniramine-HYDROcodone (TUSSIONEX) 10-8 MG/5ML suspension 5 mL  5 mL Oral Q12H PRN Wilhelmina Mcardle, MD      .  ipratropium-albuterol (DUONEB) 0.5-2.5 (3) MG/3ML nebulizer solution 3 mL  3 mL Nebulization Q4H Flora Lipps, MD      . lactated ringers 1,000 mL with potassium chloride 20 mEq  infusion   Intravenous Continuous Wilhelmina Mcardle, MD 50 mL/hr at 10/16/16 1700    . latanoprost (XALATAN) 0.005 % ophthalmic solution 1 drop  1 drop Both Eyes QHS Flora Lipps, MD      . MEDLINE mouth rinse  15 mL Mouth Rinse BID Wilhelmina Mcardle, MD   15 mL at 10/17/16 0927  . ondansetron (ZOFRAN) injection 4 mg  4 mg Intravenous Q6H PRN Wilhelmina Mcardle, MD      . oxyCODONE (Oxy IR/ROXICODONE) immediate release tablet 5 mg  5 mg Oral Q4H PRN Wilhelmina Mcardle, MD   5 mg at 10/16/16 2109  . pantoprazole (PROTONIX) EC tablet 40 mg  40 mg Oral Q1200 Wilhelmina Mcardle, MD   40 mg at 10/16/16 1752  . predniSONE (DELTASONE) tablet 40 mg  40 mg Oral Q breakfast Flora Lipps, MD       Facility-Administered Medications Ordered in Other Encounters  Medication Dose Route Frequency Provider Last Rate Last Dose  . alteplase (CATHFLO ACTIVASE) injection 2 mg  2 mg Intracatheter Once PRN Forest Gleason, MD      . heparin lock flush 100 unit/mL  500 Units Intravenous Once Forest Gleason, MD      . heparin lock flush 100 unit/mL  500 Units Intracatheter Once PRN Forest Gleason, MD      . heparin lock flush 100 unit/mL  250 Units Intracatheter Once PRN Forest Gleason, MD      . sodium chloride 0.9 % injection 10 mL  10 mL Intracatheter PRN Forest Gleason, MD      . sodium chloride flush (NS) 0.9 % injection 10 mL  10 mL Intravenous PRN Forest Gleason, MD      . sodium chloride flush (NS) 0.9 % injection 10 mL  10 mL Intravenous PRN Forest Gleason, MD   10 mL at 02/01/16 1439  . sodium chloride flush (NS) 0.9 % injection 10 mL  10 mL Intracatheter PRN Cammie Sickle, MD   10 mL at 10/09/16 1337      .  PHYSICAL EXAMINATION:  Vitals:   10/17/16 0600 10/17/16 0700  BP: 125/77 128/75  Pulse: 99 100  Resp: 20 20  Temp:  98.5 F (36.9 C)   Filed Weights   10/16/16 1020  Weight: 123 lb (55.8 kg)    GENERAL: Thin built well-developed; Alert; on 100n NRB.  She is accompanied by her husband.  EYES: no pallor or  icterus OROPHARYNX: no thrush or ulceration;  NECK: supple, no masses felt; tenderness on the right side of neck.  LYMPH: 2-3 cm Right supraclavicular adenopathy No axillary or inguinal regions LUNGS: Decreased results of the left side. crackles HEART/CVS: regular rate & rhythm and no murmurs; No lower extremity edema ABDOMEN:abdomen soft, non-tender and normal bowel sounds Musculoskeletal:no cyanosis of digits and no clubbing  PSYCH: alert & oriented x 3 with fluent speech NEURO: no focal motor/sensory deficits SKIN:  no rashes or significant lesions   LABORATORY DATA:  I have reviewed the data as listed Lab Results  Component Value Date   WBC 1.2 (LL) 10/17/2016   HGB 8.3 (L) 10/17/2016   HCT 23.8 (L) 10/17/2016   MCV 88.6 10/17/2016   PLT 29 (LL) 10/17/2016    Recent Labs  09/19/16 0810 10/08/16 0912 10/08/16  1000 10/16/16 1025 10/17/16 0522  NA 136 LINE DRAW CONTAMINATED, SPECIMEN RECOLLECTED 133* 135 135  K 3.8 LINE DRAW CONTAMINATED, SPECIMEN RECOLLECTED 3.5 3.3* 3.4*  CL 101 LINE DRAW CONTAMINATED, SPECIMEN RECOLLECTED 100* 100* 99*  CO2 24 LINE DRAW CONTAMINATED, SPECIMEN RECOLLECTED '26 24 28  '$ GLUCOSE 162* LINE DRAW CONTAMINATED, SPECIMEN RECOLLECTED 130* 138* 95  BUN 9 LINE DRAW CONTAMINATED, SPECIMEN RECOLLECTED '13 12 9  '$ CREATININE 0.84 LINE DRAW CONTAMINATED, SPECIMEN RECOLLECTED 0.90 0.87 0.65  CALCIUM 9.1 LINE DRAW CONTAMINATED, SPECIMEN RECOLLECTED 8.9 9.2 9.2  GFRNONAA >60 LINE DRAW CONTAMINATED, SPECIMEN RECOLLECTED >60 >60 >60  GFRAA >60 NOT CALCULATED >60 >60 >60  PROT 7.6 LINE DRAW CONTAMINATED, SPECIMEN RECOLLECTED 7.3  --   --   ALBUMIN 3.2* LINE DRAW CONTAMINATED, SPECIMEN RECOLLECTED 3.0*  --   --   AST 18 LINE DRAW CONTAMINATED, SPECIMEN RECOLLECTED 15  --   --   ALT 11* LINE DRAW CONTAMINATED, SPECIMEN RECOLLECTED 8*  --   --   ALKPHOS 70 LINE DRAW CONTAMINATED, SPECIMEN RECOLLECTED 63  --   --   BILITOT 0.3 LINE DRAW CONTAMINATED, SPECIMEN  RECOLLECTED 0.4  --   --     RADIOGRAPHIC STUDIES: I have personally reviewed the radiological images as listed and agreed with the findings in the report. Ct Angio Chest Pe W And/or Wo Contrast  Result Date: 10/16/2016 CLINICAL DATA:  Patient with history of lung cancer on chemotherapy. Hemoptysis. Shortness of breath. EXAM: CT ANGIOGRAPHY CHEST WITH CONTRAST TECHNIQUE: Multidetector CT imaging of the chest was performed using the standard protocol during bolus administration of intravenous contrast. Multiplanar CT image reconstructions and MIPs were obtained to evaluate the vascular anatomy. CONTRAST:  75 cc Isovue 370 COMPARISON:  Chest CT 08/22/2016. FINDINGS: Cardiovascular: There is leftward shift of the heart and mediastinum. Interval development of a moderate pericardial effusion. Normal heart size. Adequate opacification of the pulmonary arterial system. Small filling defect within a right lower lobe segmental and subsegmental pulmonary artery (image 165; series 5). Mediastinum/Nodes: Significant interval increase in size of multiple mediastinal lymph nodes including a 6.2 x 3.7 cm subcarinal nodal mass (image 43; series 4), previously 3.1 x 2.6 cm. Left hilar nodal mass measures 4.9 x 3.5 cm (image 38; series 4), previously 2.0 x 1.8 cm. Prevascular lymph node measures 4.1 x 2.6 cm (image 30; series 4), previously 3.4 x 2.6 cm. Interval decrease in size of large right peritracheal mass measuring 3.4 x 2.1 cm (image 8; series 4), previously 4.3 x 3.8 cm. Lungs/Pleura: There is near total collapse of the left lung resulting in volume loss and leftward mediastinal shift. There is abrupt cut off and non aeration of the left mainstem bronchus and more distal bronchi. Soft tissue is demonstrated within the left mainstem bronchus. Small left pleural effusion. No pneumothorax. The right lung is clear. Upper Abdomen: Interval enlargement of upper abdominal soft tissue mass measuring 5.3 x 3.7 cm (image 83;  series 4), previously 2.2 x 2.1 cm. Musculoskeletal: No aggressive or acute appearing osseous lesions. Review of the MIP images confirms the above findings. IMPRESSION: Small right lower lobe acute pulmonary embolus. Marked interval increase in size of mediastinal and left hilar adenopathy compatible with disease progression. Additionally there is an enlarging soft tissue mass within the upper abdomen compatible with disease progression. Complete opacification of the left mainstem bronchus and more distal bronchi which may be filled with mucus and/or tumor. This results in complete atelectasis of the left lung with leftward mediastinal  shift. Small left pleural effusion. New moderate pericardial effusion. Interval decrease in size of bulky nodal mass within the right supraclavicular region. These results were called by telephone at the time of interpretation on 10/16/2016 at 12:44 pm to Dr. Lenise Arena , who verbally acknowledged these results. Electronically Signed   By: Lovey Newcomer M.D.   On: 10/16/2016 12:55   Dg Chest Port 1 View  Result Date: 10/16/2016 CLINICAL DATA:  65 year old female with lung cancer. Shortness of breath prior to receiving chemotherapy. Initial encounter. EXAM: PORTABLE CHEST 1 VIEW COMPARISON:  08/22/2016 chest CT.  08/14/2026 chest x-ray. FINDINGS: Almost complete consolidation left lung with mediastinal shift to left suggesting atelectasis. This may be caused by malignancy causing obstruction of the bronchus. Mucous plug could cause a similar appearance. Post obstructive infiltrate not excluded. Right MediPort catheter tip distal superior vena cava level. No pneumothorax. No acute bony abnormality. IMPRESSION: Almost complete consolidation left lung with mediastinal shift to left suggesting atelectasis. This may be caused by malignancy causing obstruction of the bronchus. Mucous plug could cause a similar appearance. Post obstructive infiltrate may also be present.  Electronically Signed   By: Genia Del M.D.   On: 10/16/2016 10:39    ASSESSMENT & PLAN:   # 65 year old female patient with a history of recurrent platinum refractory small cell lung cancer/metastatic-   is currently admitted to the hospital for worsening shortness of breath  # Acute respiratory failure- CT scan shows progressive malignancy/collapse of the left lung.   # Metastatic small cell lung cancer- progressive malignancy on chemotherapy; most recent chemotherapy was Topotecan. Given the rapidly progressive malignancy/poor tolerance to chemotherapy- I would not recommend any further chemotherapy. I do not think radiation is going to make significant impact on overall disease. I would recommend palliative care evaluation/hospice.  # Agreement with DNR/DNI.   # Discussed with the patient and family that the life expectancy would be measured in weeks  # Husband and the patient- agree with palliative care evaluation. Discussed with palliative care RN; ansl also discussed with Dr.Kasa.    Cammie Sickle, MD 10/17/2016 11:11 AM

## 2016-10-17 NOTE — Progress Notes (Signed)
Patient alert and oriented. Has not complained of any pain. She is currently on Venti mask. Poor po intake. Has been using bedpan. Awaiting for palliative care for goals and treatment.

## 2016-10-18 DIAGNOSIS — C3492 Malignant neoplasm of unspecified part of left bronchus or lung: Secondary | ICD-10-CM

## 2016-10-18 DIAGNOSIS — Z515 Encounter for palliative care: Secondary | ICD-10-CM

## 2016-10-18 MED ORDER — MORPHINE SULFATE (CONCENTRATE) 10 MG/0.5ML PO SOLN: 5.0000 mg | ORAL | 0 refills | Status: AC | PRN

## 2016-10-18 NOTE — Progress Notes (Signed)
[place] responded to an OR for End of Life. Pt was alert and husband was bedside. Pt engaged in pleasant conversation. Pt stated that she was hoping to transition to [facility], and if strong enough wanted to go home. She is most concerned about pain alleviation and spending quality time with her family. CH provided the ministry of presence, conversation, empathetic listening, and prayer. CH is available for follow up as needed.    10/18/16 1500  Clinical Encounter Type  Visited With Patient;Patient and family together  Visit Type Follow-up;Spiritual support;Patient actively dying  Referral From Nurse  Consult/Referral To Chaplain  Spiritual Encounters  Spiritual Needs Prayer;Emotional;Grief support  Stress Factors  Patient Stress Factors Health changes;Major life changes  Family Stress Factors Health changes;Major life changes

## 2016-10-18 NOTE — Plan of Care (Signed)
Problem: Health Behavior/Discharge Planning: Goal: Ability to manage health-related needs will improve Outcome: Not Applicable Date Met: 22/63/33 Poor prognosis. Pt made comfort care yesterday.

## 2016-10-18 NOTE — Progress Notes (Signed)
[facility] [place] NAME: Terence Googe    MR#:  [id number]  DATE OF BIRTH:  02/03/1951  SUBJECTIVE:  CHIEF COMPLAINT:   Chief Complaint  Patient presents with  . Shortness of Breath  . Hemoptysis  requests just comfort care only. Nebs make her cough more so refuses. Family at bedside REVIEW OF SYSTEMS:  Review of Systems  Unable to perform ROS: Critical illness   DRUG ALLERGIES:   Allergies  Allergen Reactions  . Amoxicillin Other (See Comments)    Reaction:  Unknown   . Levofloxacin Other (See Comments)    Reaction:  Joint and muscle pain  Pt states that this medication made her unable to walk.    VITALS:  Blood pressure 122/60, pulse (!) 102, temperature 97.6 F (36.4 C), temperature source Axillary, resp. rate 20, height '5\' 5"'$  (1.651 m), weight 55.8 kg (123 lb), SpO2 100 %. PHYSICAL EXAMINATION:  Physical Exam  Constitutional: She is oriented to person, place, and time. She appears to be writhing in pain and malnourished. She appears unhealthy. She appears cachectic. She has a sickly appearance. She appears distressed.  HENT:  Head: Normocephalic and atraumatic.  Eyes: Conjunctivae and EOM are normal. Pupils are equal, round, and reactive to light.  Neck: Normal range of motion. Neck supple. No tracheal deviation present. No thyromegaly present.  Cardiovascular: Normal rate, regular rhythm and normal heart sounds.   Pulmonary/Chest: Accessory muscle usage present. Tachypnea noted. She is in respiratory distress. She has no wheezes. She has rhonchi. She exhibits no tenderness.  Abdominal: Soft. Bowel sounds are normal. She exhibits no distension. There is no tenderness.  Musculoskeletal: Normal range of motion.  Neurological: She is alert and oriented to person, place, and time. No cranial nerve deficit.  Skin: Skin is warm and dry. No rash noted.  Psychiatric: Mood and affect normal.   LABORATORY PANEL:   CBC  Recent  Labs Lab 10/17/16 0522  WBC 1.2*  HGB 8.3*  HCT 23.8*  PLT 29*   ------------------------------------------------------------------------------------------------------------------ Chemistries   Recent Labs Lab 10/17/16 0522  NA 135  K 3.4*  CL 99*  CO2 28  GLUCOSE 95  BUN 9  CREATININE 0.65  CALCIUM 9.2   RADIOLOGY:  No results found. 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  - waiting for Hospice home bed  # Metastatic small cell lung cancer- progressive malignancy on chemotherapy; Given the rapidly progressive malignancy/poor tolerance to chemotherapy- Onco and Pulmo recommend palliative care evaluation/hospice. waiting for Hospice home bed  * L lung collapse, leftward mediastinal shift, small L effusion, airway cutoff - all of this is highly suggestive of L main bronchus obstruction, likely due to tumor with both extrinsic compression and likely endobronchial component. - comfort care for now  * Hemoptysis - likely due to endobronchial tumor * Possible small RLL PE * Pancytopenia  * Palliative Care consultation to address goals of care    Overall Prognosis is very poor, recommend Hospice home placement   All the records are reviewed and case discussed with Care Management/Social Worker. Management plans discussed with the patient, family and they are in agreement.  CODE STATUS: DNR, COMFORT CARE  TOTAL TIME TAKING CARE OF THIS PATIENT: 35 minutes.   More than  50% of the time was spent in counseling/coordination of care: YES  POSSIBLE D/C IN AM, DEPENDING ON CLINICAL CONDITION. AND HOSPICE bed availability   Max Sane M.D on 10/18/2016 at 2:22 PM  Between 7am to 6pm - Pager - [phone]  After  6pm go to [email or web address] - Proofreader  [facility]  Office  [phone]  CC: Primary care physician; Marden Noble, MD  Note: This dictation was prepared with Dragon dictation along with smaller phrase technology. Any transcriptional errors that result from this process are unintentional.

## 2016-10-18 NOTE — Progress Notes (Signed)
Nutrition Brief Note  65 year old female patient with a history of recurrent platinum refractory small cell lung cancer/metastatic/with progressive disease s/p chemotherapy.  Chart reviewed and discussed with RN. Patient now transitioning to comfort care.   No nutrition interventions warranted at this time. Can continue comfort feeds as desired by patient and as medically appropriate per MD. Please consult Dietitian as needed.   Willey Blade, MS, RD, LDN Pager: [phone] After Hours Pager: [phone]

## 2016-10-18 NOTE — Clinical Social Work Note (Signed)
Clinical Social Work Assessment  Patient Details  Name: Cynthia Carson MRN: [id number] Date of Birth: Jan 22, 1951  Date of referral:  10/18/16               Reason for consult:  End of Life/Hospice                Permission sought to share information with:  Facility Sport and exercise psychologist, Family Supports Permission granted to share information::  Yes, Verbal Permission Granted  Name::     Markelle, Asaro [phone]   Agency::  Hospice House Admissions  Relationship::     Contact Information:     Housing/Transportation Living arrangements for the past 2 months:  [place] of Information:  Patient, Spouse Patient Interpreter Needed:  None Criminal Activity/Legal Involvement Pertinent to Current Situation/Hospitalization:  No - Comment as needed Significant Relationships:  Spouse Lives with:  Spouse Do you feel safe going back to the place where you live?  No Need for family participation in patient care:  No (Coment)  Care giving concerns:  Patient and her family would like to go to [place] for end of life care.   Social Worker assessment / plan:  Patient is a 65 year old female who is married and lives with her husband.  Patient is alert and oriented x4.  Patient and her family have met with palliative care and would like patient to go to hospice house for end of life care.  Patient and her family were not very talkative.  Patient and her family are agreeable to going to [facility], MSW contacted nurse liason who will meet with patient and her family.  Employment status:  Unemployed Forensic scientist:  Managed Care PT Recommendations:  No Follow Up Information / Referral to community resources:     Patient/Family's Response to care:  Patient and family agreeable to going to hospice house  Patient/Family's Understanding of and Emotional Response to Diagnosis, Current Treatment, and Prognosis:  Patient and family are aware of  patient's end of life approaching.  Emotional Assessment Appearance:  Appears stated age Attitude/Demeanor/Rapport:    Affect (typically observed):  Appropriate, Calm Orientation:  Oriented to Self, Oriented to Place, Oriented to  Time, Oriented to Situation Alcohol / Substance use:  Not Applicable Psych involvement (Current and /or in the community):  No (Comment)  Discharge Needs  Concerns to be addressed:  Lack of Support Readmission within the last 30 days:  No Current discharge risk:  Terminally ill Barriers to Discharge:  No Barriers Identified   Ross Ludwig 10/18/2016, 2:04 PM

## 2016-10-18 NOTE — Clinical Social Work Note (Signed)
MSW received referral for patient needing hospice placement.  MSW spoke to patient and her family they would like [facility].  MSW made referral to [facility] and she will review patient's information.  MSW to continue to follow patient's progress throughout discharge planning.  Jones Broom. Cynthia Carson, MSW [phone]  Mon-Fri 8a-4:30p 10/18/2016 12:06 PM

## 2016-10-18 NOTE — Discharge Instructions (Signed)
Hospice °Hospice is a service that is designed to provide people who are terminally ill and their families with medical, spiritual, and psychological support. Its aim is to improve your quality of life by keeping you as alert and comfortable as possible. Hospice is performed by a team of health care professionals and volunteers who: °· Help keep you comfortable. Hospice can be provided in your home or in a homelike setting. The hospice staff works with your family and friends to help meet your needs. You will enjoy the support of loved ones by receiving much of your basic care from family and friends. °· Provide pain relief and manage your symptoms. The staff supply all necessary medicines and equipment. °· Provide companionship when you are alone. °· Allow you and your family to rest. They may do light housekeeping, prepare meals, and run errands. °· Provide counseling. They will make sure your emotional, spiritual, and social needs and those of your family are being met. °· Provide spiritual care. Spiritual care is individualized to meet your needs and your family's needs. It may involve helping you look at what death means to you, say goodbye, or perform a specific religious ceremony or ritual. °Hospice teams often include: °· A nurse. °· A doctor. °· Social workers. °· Religious leaders (such as a chaplain). °· Trained volunteers. °WHEN SHOULD HOSPICE CARE BEGIN? °Most people who use hospice are believed to have fewer than 6 months to live. Your family and health care providers can help you decide when hospice services should begin. If your condition improves, you may discontinue the program. °WHAT SHOULD I CONSIDER BEFORE SELECTING A PROGRAM? °Most hospice programs are run by nonprofit, independent organizations. Some are affiliated with hospitals, nursing homes, or home health care agencies. Hospice programs can take place in the home or at a hospice center, hospital, or skilled nursing facility. When choosing  a hospice program, ask the following questions: °· What services are available to me? °· What services are offered to my loved ones? °· How involved are my loved ones? °· How involved is my health care provider? °· Who makes up the hospice care team? How are they trained or screened? °· How will my pain and symptoms be managed? °· If my circumstances change, can the services be provided in a different setting, such as my home or in the hospital? °· Is the program reviewed and licensed by the state or certified in some other way? °WHERE CAN I LEARN MORE ABOUT HOSPICE? °You can learn about existing hospice programs in your area from your health care providers. You can also read more about hospice online. The websites of the following organizations contain helpful information: °· The National Hospice and Palliative Care Organization (NHPCO). °· The Hospice Association of America (HAA). °· The Hospice Education Institute. °· The American Cancer Society (ACS). °· Hospice Net. °This information is not intended to replace advice given to you by your health care provider. Make sure you discuss any questions you have with your health care provider. °Document Released: 02/22/2004 Document Revised: 11/10/2013 Document Reviewed: 09/15/2013 °Elsevier Interactive Patient Education © 2017 Elsevier Inc. ° °

## 2016-10-18 NOTE — Progress Notes (Signed)
[place] NOTE  Patient Care Team: Marden Noble, MD as PCP - General (Internal Medicine)  CHIEF COMPLAINTS/PURPOSE OF CONSULTATION: small cell lung cancer/acute on chronic respiratory failure  CC: Patient transferred out of ICU yesterday.; Currently on comfort measures only. Patient continues to be on high flow nasal oxygen. Patient denies any pain. Patient is awaiting to meet her brother coming from [place].   No diarrhea. No nausea.  MEDICAL HISTORY:  Past Medical History:  Diagnosis Date  . Cancer of lung ([place]) 12/05/2015  . Diabetes mellitus without complication ([place])     SURGICAL HISTORY: Past Surgical History:  Procedure Laterality Date  . ABDOMINAL HYSTERECTOMY    . PERIPHERAL VASCULAR CATHETERIZATION N/A 01/13/2016   Procedure: Glori Luis Cath Insertion;  Surgeon: Katha Cabal, MD;  Location: [facility];  Service: Cardiovascular;  Laterality: N/A;    SOCIAL HISTORY: Social History   Social History  . Marital status: Married    Spouse name: N/A  . Number of children: N/A  . Years of education: N/A   Occupational History  . Not on file.   Social History Main Topics  . Smoking status: Former Smoker    Packs/day: 1.00    Years: 49.00    Types: Cigarettes    Quit date: 11/19/2011  . Smokeless tobacco: Never Used  . Alcohol use No  . Drug use: No  . Sexual activity: Yes    Birth control/ protection: Surgical   Other Topics Concern  . Not on file   Social History Narrative  . No narrative on file    FAMILY HISTORY: No family history on file.  ALLERGIES:  is allergic to amoxicillin and levofloxacin.  MEDICATIONS:  Current Facility-Administered Medications  Medication Dose Route Frequency Provider Last Rate Last Dose  . acetaminophen (TYLENOL) tablet 650 mg  650 mg Oral Q4H PRN Wilhelmina Mcardle, MD      . ALPRAZolam Duanne Moron) tablet 0.5 mg  0.5 mg Oral TID PRN Wilhelmina Mcardle, MD   0.5 mg at 10/17/16 2008  . latanoprost  (XALATAN) 0.005 % ophthalmic solution 1 drop  1 drop Both Eyes QHS Flora Lipps, MD      . MEDLINE mouth rinse  15 mL Mouth Rinse BID Wilhelmina Mcardle, MD   15 mL at 10/17/16 0927  . morphine CONCENTRATE 10 MG/0.5ML oral solution 5 mg  5 mg Oral T6L PRN Pershing Proud, NP      . oxyCODONE (Oxy IR/ROXICODONE) immediate release tablet 5 mg  5 mg Oral Q4H PRN Wilhelmina Mcardle, MD   5 mg at 10/16/16 2109   Facility-Administered Medications Ordered in Other Encounters  Medication Dose Route Frequency Provider Last Rate Last Dose  . alteplase (CATHFLO ACTIVASE) injection 2 mg  2 mg Intracatheter Once PRN Forest Gleason, MD      . heparin lock flush 100 unit/mL  500 Units Intravenous Once Forest Gleason, MD      . heparin lock flush 100 unit/mL  500 Units Intracatheter Once PRN Forest Gleason, MD      . heparin lock flush 100 unit/mL  250 Units Intracatheter Once PRN Forest Gleason, MD      . sodium chloride 0.9 % injection 10 mL  10 mL Intracatheter PRN Forest Gleason, MD      . sodium chloride flush (NS) 0.9 % injection 10 mL  10 mL Intravenous PRN Forest Gleason, MD      . sodium chloride flush (NS) 0.9 %  injection 10 mL  10 mL Intravenous PRN Forest Gleason, MD   10 mL at 02/01/16 1439  . sodium chloride flush (NS) 0.9 % injection 10 mL  10 mL Intracatheter PRN Cammie Sickle, MD   10 mL at 10/09/16 1337      .  PHYSICAL EXAMINATION:  Vitals:   10/17/16 1510 10/18/16 0407  BP: 128/78 109/71  Pulse: (!) 104 94  Resp: 20 16  Temp: 97.8 F (36.6 C) 97.6 F (36.4 C)   Filed Weights   10/16/16 1020  Weight: 123 lb (55.8 kg)    GENERAL: Thin built well-developed; Alert; on 100n NRB.  She is alone. EYES: no pallor or icterus OROPHARYNX: no thrush or ulceration;  NECK: supple, no masses felt; tenderness on the right side of neck.  LYMPH: 2-3 cm Right supraclavicular adenopathy No axillary or inguinal regions LUNGS: Decreased results of the left side. crackles HEART/CVS: regular rate & rhythm and  no murmurs; No lower extremity edema ABDOMEN:abdomen soft, non-tender and normal bowel sounds Musculoskeletal:no cyanosis of digits and no clubbing  PSYCH: alert & oriented x 3 with fluent speech NEURO: no focal motor/sensory deficits SKIN:  no rashes or significant lesions   LABORATORY DATA:  I have reviewed the data as listed Lab Results  Component Value Date   WBC 1.2 (LL) 10/17/2016   HGB 8.3 (L) 10/17/2016   HCT 23.8 (L) 10/17/2016   MCV 88.6 10/17/2016   PLT 29 (LL) 10/17/2016    Recent Labs  09/19/16 0810 10/08/16 0912 10/08/16 1000 10/16/16 1025 10/17/16 0522  NA 136 LINE DRAW CONTAMINATED, SPECIMEN RECOLLECTED 133* 135 135  K 3.8 LINE DRAW CONTAMINATED, SPECIMEN RECOLLECTED 3.5 3.3* 3.4*  CL 101 LINE DRAW CONTAMINATED, SPECIMEN RECOLLECTED 100* 100* 99*  CO2 24 LINE DRAW CONTAMINATED, SPECIMEN RECOLLECTED '26 24 28  '$ GLUCOSE 162* LINE DRAW CONTAMINATED, SPECIMEN RECOLLECTED 130* 138* 95  BUN 9 LINE DRAW CONTAMINATED, SPECIMEN RECOLLECTED '13 12 9  '$ CREATININE 0.84 LINE DRAW CONTAMINATED, SPECIMEN RECOLLECTED 0.90 0.87 0.65  CALCIUM 9.1 LINE DRAW CONTAMINATED, SPECIMEN RECOLLECTED 8.9 9.2 9.2  GFRNONAA >60 LINE DRAW CONTAMINATED, SPECIMEN RECOLLECTED >60 >60 >60  GFRAA >60 NOT CALCULATED >60 >60 >60  PROT 7.6 LINE DRAW CONTAMINATED, SPECIMEN RECOLLECTED 7.3  --   --   ALBUMIN 3.2* LINE DRAW CONTAMINATED, SPECIMEN RECOLLECTED 3.0*  --   --   AST 18 LINE DRAW CONTAMINATED, SPECIMEN RECOLLECTED 15  --   --   ALT 11* LINE DRAW CONTAMINATED, SPECIMEN RECOLLECTED 8*  --   --   ALKPHOS 70 LINE DRAW CONTAMINATED, SPECIMEN RECOLLECTED 63  --   --   BILITOT 0.3 LINE DRAW CONTAMINATED, SPECIMEN RECOLLECTED 0.4  --   --     RADIOGRAPHIC STUDIES: I have personally reviewed the radiological images as listed and agreed with the findings in the report. Ct Angio Chest Pe W And/or Wo Contrast  Result Date: 10/16/2016 CLINICAL DATA:  Patient with history of lung cancer on  chemotherapy. Hemoptysis. Shortness of breath. EXAM: CT ANGIOGRAPHY CHEST WITH CONTRAST TECHNIQUE: Multidetector CT imaging of the chest was performed using the standard protocol during bolus administration of intravenous contrast. Multiplanar CT image reconstructions and MIPs were obtained to evaluate the vascular anatomy. CONTRAST:  75 cc Isovue 370 COMPARISON:  Chest CT 08/22/2016. FINDINGS: Cardiovascular: There is leftward shift of the heart and mediastinum. Interval development of a moderate pericardial effusion. Normal heart size. Adequate opacification of the pulmonary arterial system. Small filling defect within a right lower  lobe segmental and subsegmental pulmonary artery (image 165; series 5). Mediastinum/Nodes: Significant interval increase in size of multiple mediastinal lymph nodes including a 6.2 x 3.7 cm subcarinal nodal mass (image 43; series 4), previously 3.1 x 2.6 cm. Left hilar nodal mass measures 4.9 x 3.5 cm (image 38; series 4), previously 2.0 x 1.8 cm. Prevascular lymph node measures 4.1 x 2.6 cm (image 30; series 4), previously 3.4 x 2.6 cm. Interval decrease in size of large right peritracheal mass measuring 3.4 x 2.1 cm (image 8; series 4), previously 4.3 x 3.8 cm. Lungs/Pleura: There is near total collapse of the left lung resulting in volume loss and leftward mediastinal shift. There is abrupt cut off and non aeration of the left mainstem bronchus and more distal bronchi. Soft tissue is demonstrated within the left mainstem bronchus. Small left pleural effusion. No pneumothorax. The right lung is clear. Upper Abdomen: Interval enlargement of upper abdominal soft tissue mass measuring 5.3 x 3.7 cm (image 83; series 4), previously 2.2 x 2.1 cm. Musculoskeletal: No aggressive or acute appearing osseous lesions. Review of the MIP images confirms the above findings. IMPRESSION: Small right lower lobe acute pulmonary embolus. Marked interval increase in size of mediastinal and left hilar  adenopathy compatible with disease progression. Additionally there is an enlarging soft tissue mass within the upper abdomen compatible with disease progression. Complete opacification of the left mainstem bronchus and more distal bronchi which may be filled with mucus and/or tumor. This results in complete atelectasis of the left lung with leftward mediastinal shift. Small left pleural effusion. New moderate pericardial effusion. Interval decrease in size of bulky nodal mass within the right supraclavicular region. These results were called by telephone at the time of interpretation on 10/16/2016 at 12:44 pm to Dr. Lenise Arena , who verbally acknowledged these results. Electronically Signed   By: Lovey Newcomer M.D.   On: 10/16/2016 12:55   Dg Chest Port 1 View  Result Date: 10/16/2016 CLINICAL DATA:  65 year old female with lung cancer. Shortness of breath prior to receiving chemotherapy. Initial encounter. EXAM: PORTABLE CHEST 1 VIEW COMPARISON:  08/22/2016 chest CT.  08/14/2026 chest x-ray. FINDINGS: Almost complete consolidation left lung with mediastinal shift to left suggesting atelectasis. This may be caused by malignancy causing obstruction of the bronchus. Mucous plug could cause a similar appearance. Post obstructive infiltrate not excluded. Right MediPort catheter tip distal superior vena cava level. No pneumothorax. No acute bony abnormality. IMPRESSION: Almost complete consolidation left lung with mediastinal shift to left suggesting atelectasis. This may be caused by malignancy causing obstruction of the bronchus. Mucous plug could cause a similar appearance. Post obstructive infiltrate may also be present. Electronically Signed   By: Genia Del M.D.   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 Patient not a candidate for any further therapy including chemotherapy or radiation. Agree with comfort measures.  # Discussed disposition- patient interested in going to hospice home if possible rather than her own home with hospice. "I do not want to be a burden to her family"  # discussed with Dr.Shah/Brenda. Awaiting palliative care evaluation.    Cammie Sickle, MD 10/18/2016 8:43 AM

## 2016-10-18 NOTE — Progress Notes (Signed)
New referral for hospice home received from [place]. Cynthia Carson is a 65 year old with history of metastatic small cell lung cancer. She was admitted to [facility] on 11/28 for evaluation and treatment of acute respiratory failure w/hemoptysis,  requiring Hi flo oxygen. Chest CT revealed a total collapse of the left lung  with proximal airway cutoff and mediastinal shift to the left consistent with a left main bronchus obstruction as well as a small pulmonary embolism in the right lower lobe and not a candidate for anticoagulation therapy due to thrombocytopenia. Palliative Medicine was consulted for goals of Care and met with Cynthia Carson and her family. They have chosen to focus on comfort at end of life with transfer to the hospice home.  Writer met in the room with patient and her husband Cynthia Carson to initiate education regarding hospice services. philosophy and team approach to care with good understanding voiced. Patient alert and oriented and able to sign her own consents, husband in agreement.  Plan is for transfer to the hospice home tomorrow 12/1. Hospital care team all aware and in agreement. Signed out of facility DNR in place. Thank you for the opportunity to be involved in the care of this patient and her family. Flo Shanks RN, BSN, [place] [facility] [place], [facility] [phone] c

## 2016-10-19 MED ORDER — PANTOPRAZOLE SODIUM 20 MG PO TBEC: 20.0000 mg | DELAYED_RELEASE_TABLET | Freq: Two times a day (BID) | ORAL | Status: DC

## 2016-10-19 MED ORDER — PANTOPRAZOLE SODIUM 40 MG PO TBEC
40.0000 mg | DELAYED_RELEASE_TABLET | Freq: Every day | ORAL | Status: DC
  Administered 2016-10-19: 40 mg via ORAL
  Filled 2016-10-19: qty 1

## 2016-10-19 NOTE — Progress Notes (Signed)
Follow up visit made. Patient seen sitting up in bed, alert and oriented, family at bedside. Patient, family and hospital care team all aware of and in agreement with plan for transfer to the hospice home today. Patient continues on venturi mask at 35%, she ahs no signs of dyspnea at this current setting and oxygen saturations are 100%. Writer spoke with patient about a transition to a simple face mask, she was very agreeable, she just does not want the cannula d/t the irritation it causes her. Discussed with staff RN Dedra and attending physician Dr. Manuella Ghazi, both in agreement. Writer contacted [place], Respiratory therapist who will plan to transition patient this morning. Report called to the hospice home. Hospice home staff have requested that the Geo matt present on patient's bed here at [facility] be sent with her to the [facility]. family agreeable and will take once EMS moves patient to their stretcher. Writer to notify EMS for transport around 12 o'clock, hospital care team all aware. Thank you. Flo Shanks RN, BSN, [place] [facility] [place], [facility] [phone] c

## 2016-10-19 NOTE — Clinical Social Work Note (Signed)
Patient to be d/c'ed today to [facility].  Patient and family agreeable to plans will transport via [place] RN to call report.  Evette Cristal, MSW Mon-Fri 8a-4:30p [phone]

## 2016-10-19 NOTE — Progress Notes (Signed)
CH made a follow up visit. Pt was alert and resting. Family was bedside. Pt indicated that she will transfer to [facility] today. Pt was pleased that her family was here and looking forward to her brother arriving soon. [place] engaged in conversation with Pt and family on various subjects. CH provided the ministry of presence, conversation, and prayer.    10/19/16 1300  Clinical Encounter Type  Visited With Patient;Patient and family together  Visit Type Follow-up;Spiritual support  Referral From Nurse  Spiritual Encounters  Spiritual Needs Prayer

## 2016-10-19 NOTE — Discharge Summary (Addendum)
[facility] [place] NAME: Cynthia Carson    MR#:  [id number]  DATE OF BIRTH:  1951-11-17  DATE OF ADMISSION:  10/16/2016   ADMITTING PHYSICIAN: Wilhelmina Mcardle, MD  DATE OF DISCHARGE: 10/19/2016  PRIMARY CARE PHYSICIAN: Marden Noble, MD   ADMISSION DIAGNOSIS:  Acute respiratory failure with hypoxia ([place]) [J96.01] DISCHARGE DIAGNOSIS:  Active Problems:   Acute respiratory failure with hypoxia (H[facility])   Small cell carcinoma of left lung [facility])   Palliative care encounter  SECONDARY DIAGNOSIS:   Past Medical History:  Diagnosis Date  . Cancer of lung ([place]) 12/05/2015  . 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  - waiting for Hospice home bed  # Metastatic small cell lung cancer- progressive malignancy on chemotherapy; Given the rapidly progressive malignancy/poor tolerance to chemotherapy- Onco and Pulmo recommend palliative care evaluation/hospice. waiting for Hospice home bed  * L lung collapse, leftward mediastinal shift, small L effusion, airway cutoff - all of this is highly suggestive of L main bronchus obstruction, likely due to tumor with both extrinsic compression and likely endobronchial component. - comfort care for now  * Hemoptysis - likely due to endobronchial tumor * Possible small RLL PE * Pancytopenia  DISCHARGE CONDITIONS:  critical CONSULTS OBTAINED:  Treatment Team:  Cammie Sickle, MD DRUG ALLERGIES:   Allergies  Allergen Reactions  . Amoxicillin Other (See Comments)    Reaction:  Unknown   . Levofloxacin Other (See Comments)    Reaction:  Joint and muscle pain  Pt states that this medication  made her unable to walk.    DISCHARGE MEDICATIONS:     Medication List    STOP taking these medications   benzonatate 200 MG capsule Commonly known as:  TESSALON   calcium carbonate 1500 (600 Ca) MG Tabs tablet Commonly known as:  OSCAL   chlorpheniramine-HYDROcodone 10-8 MG/5ML Suer Commonly known as:  TUSSIONEX PENNKINETIC ER   glimepiride 2 MG tablet Commonly known as:  AMARYL   HYDROcodone-acetaminophen 5-325 MG tablet Commonly known as:  NORCO/VICODIN   lidocaine-prilocaine cream Commonly known as:  EMLA   omeprazole 20 MG capsule Commonly known as:  PRILOSEC   potassium chloride SA 20 MEQ tablet Commonly known as:  K-DUR,KLOR-CON   sitaGLIPtin 100 MG tablet Commonly known as:  JANUVIA   VITAMIN D PO     TAKE these medications   ALPRAZolam 0.5 MG tablet Commonly known as:  XANAX Take 0.5 mg by mouth 3 (three) times daily as needed for anxiety.   latanoprost 0.005 % ophthalmic solution Commonly known as:  XALATAN Place 1 drop into both eyes at bedtime.   magic mouthwash w/lidocaine Soln Take 5 mLs by mouth 4 (four) times daily. 80 ml viscous lidocaine 2%, 80 ml Mylanta, 80 ml Diphenhydramine 12.5 mg/5 ml Elixir, 80 ml Nystatin 100,000 Unit suspension, 80 ml Prednisolone 15 mg/28m, 80 ml Distilled Water. Sig: Swish/Swallow 5-10 ml four times a day as needed. Dispense 480 ml. 3RFs   morphine CONCENTRATE 10 MG/0.5ML Soln concentrated solution Take 0.25 mLs (5 mg total) by mouth every 2 (two) hours as needed for shortness of breath.   prochlorperazine 10 MG tablet Commonly known as:  COMPAZINE Take  1 tablet (10 mg total) by mouth every 6 (six) hours as needed for nausea or vomiting.      DISCHARGE INSTRUCTIONS:   DIET:  Regular diet DISCHARGE CONDITION:  Critical ACTIVITY:  Activity as tolerated OXYGEN:  Home Oxygen: Yes.    Oxygen Delivery: 2 liters/min via Patient connected to nasal cannula oxygen or mask whatever makes her feel  comfortable DISCHARGE LOCATION:  Hospice Home   If you experience worsening of your admission symptoms, develop shortness of breath, life threatening emergency, suicidal or homicidal thoughts you must seek medical attention immediately by calling 911 or calling your MD immediately  if symptoms less severe.  You Must read complete instructions/literature along with all the possible adverse reactions/side effects for all the Medicines you take and that have been prescribed to you. Take any new Medicines after you have completely understood and accpet all the possible adverse reactions/side effects.   Please note  You were cared for by a hospitalist during your hospital stay. If you have any questions about your discharge medications or the care you received while you were in the hospital after you are discharged, you can call the unit and asked to speak with the hospitalist on call if the hospitalist that took care of you is not available. Once you are discharged, your primary care physician will handle any further medical issues. Please note that NO REFILLS for any discharge medications will be authorized once you are discharged, as it is imperative that you return to your primary care physician (or establish a relationship with a primary care physician if you do not have one) for your aftercare needs so that they can reassess your need for medications and monitor your lab values.    On the day of Discharge:  VITAL SIGNS:  Blood pressure 90/60, pulse 89, temperature 97.7 F (36.5 C), temperature source Oral, resp. rate 16, height '5\' 5"'$  (1.651 m), weight 55.8 kg (123 lb), SpO2 100 %. PHYSICAL EXAMINATION:  Constitutional: She is oriented to person, place, and time. She appears to be in no pain and malnourished. She appears unhealthy. She appears cachectic. She has a sickly appearance. She appears distressed.  HENT:  Head: Normocephalic and atraumatic.  Eyes: Conjunctivae and EOM are normal.  Pupils are equal, round, and reactive to light.  Neck: Normal range of motion. Neck supple. No tracheal deviation present. No thyromegaly present.  Cardiovascular: Normal rate, regular rhythm and normal heart sounds.   Pulmonary/Chest: Accessory muscle usage present. Tachypnea noted. She is in respiratory distress. She has no wheezes. She has rhonchi. She exhibits no tenderness.  Abdominal: Soft. Bowel sounds are normal. She exhibits no distension. There is no tenderness.  Musculoskeletal: Normal range of motion.  Neurological: She is alert and oriented to person, place, and time. No cranial nerve deficit.  Skin: Skin is warm and dry. No rash noted.  Psychiatric: Mood and affect normal.  DATA REVIEW:   CBC  Recent Labs Lab 10/17/16 0522  WBC 1.2*  HGB 8.3*  HCT 23.8*  PLT 29*    Chemistries   Recent Labs Lab 10/17/16 0522  NA 135  K 3.4*  CL 99*  CO2 28  GLUCOSE 95  BUN 9  CREATININE 0.65  CALCIUM 9.2    Follow-up Information    [facility]. Go in 1 day(s).   Specialty:  Hospice and Palliative Medicine Contact information: [place] [address] [phone]  Management plans discussed with the patient, family and they are in agreement.  CODE STATUS: DNR, comfort care  TOTAL TIME TAKING CARE OF THIS PATIENT: 35 minutes.    Max Sane M.D on 10/19/2016 at 9:34 AM  Between 7am to 6pm - Pager - [phone]  After 6pm go to [email or web address] - Proofreader  [facility]  Office  [phone]  CC: Primary care physician; Marden Noble, MD   Note: This dictation was prepared with Dragon dictation along with smaller phrase technology. Any transcriptional errors that result from this process are unintentional.

## 2016-10-19 NOTE — Progress Notes (Signed)
Patient discharged to hospice home per MD order. Transportation by EMS.

## 2016-10-19 NOTE — Progress Notes (Signed)
EMS notified for transport. [facility] Hospital care team aware. Flo Shanks RN, BSN, [facility]

## 2016-10-21 LAB — CULTURE, BLOOD (ROUTINE X 2)
CULTURE: NO GROWTH
Culture: NO GROWTH

## 2016-10-22 ENCOUNTER — Ambulatory Visit: Payer: BLUE CROSS/BLUE SHIELD | Attending: Radiation Oncology | Admitting: Radiation Oncology

## 2016-10-29 ENCOUNTER — Inpatient Hospital Stay: Payer: BLUE CROSS/BLUE SHIELD | Admitting: Internal Medicine

## 2016-10-29 ENCOUNTER — Inpatient Hospital Stay: Payer: BLUE CROSS/BLUE SHIELD

## 2016-10-30 ENCOUNTER — Inpatient Hospital Stay: Payer: BLUE CROSS/BLUE SHIELD

## 2016-10-31 ENCOUNTER — Ambulatory Visit: Payer: BLUE CROSS/BLUE SHIELD

## 2016-11-01 ENCOUNTER — Ambulatory Visit: Payer: BLUE CROSS/BLUE SHIELD

## 2016-11-02 ENCOUNTER — Ambulatory Visit: Payer: BLUE CROSS/BLUE SHIELD

## 2016-11-02 ENCOUNTER — Other Ambulatory Visit: Payer: Self-pay | Admitting: Nurse Practitioner

## 2016-11-19 DEATH — deceased

## 2018-05-01 IMAGING — CR DG CHEST 2V
2 series · 2 of 2 positions shown · non-contrast
Comparison: 05/30/2016

CLINICAL DATA: Right side of neck swelling for 1 month

EXAM:
CHEST  2 VIEW

[chest pa]
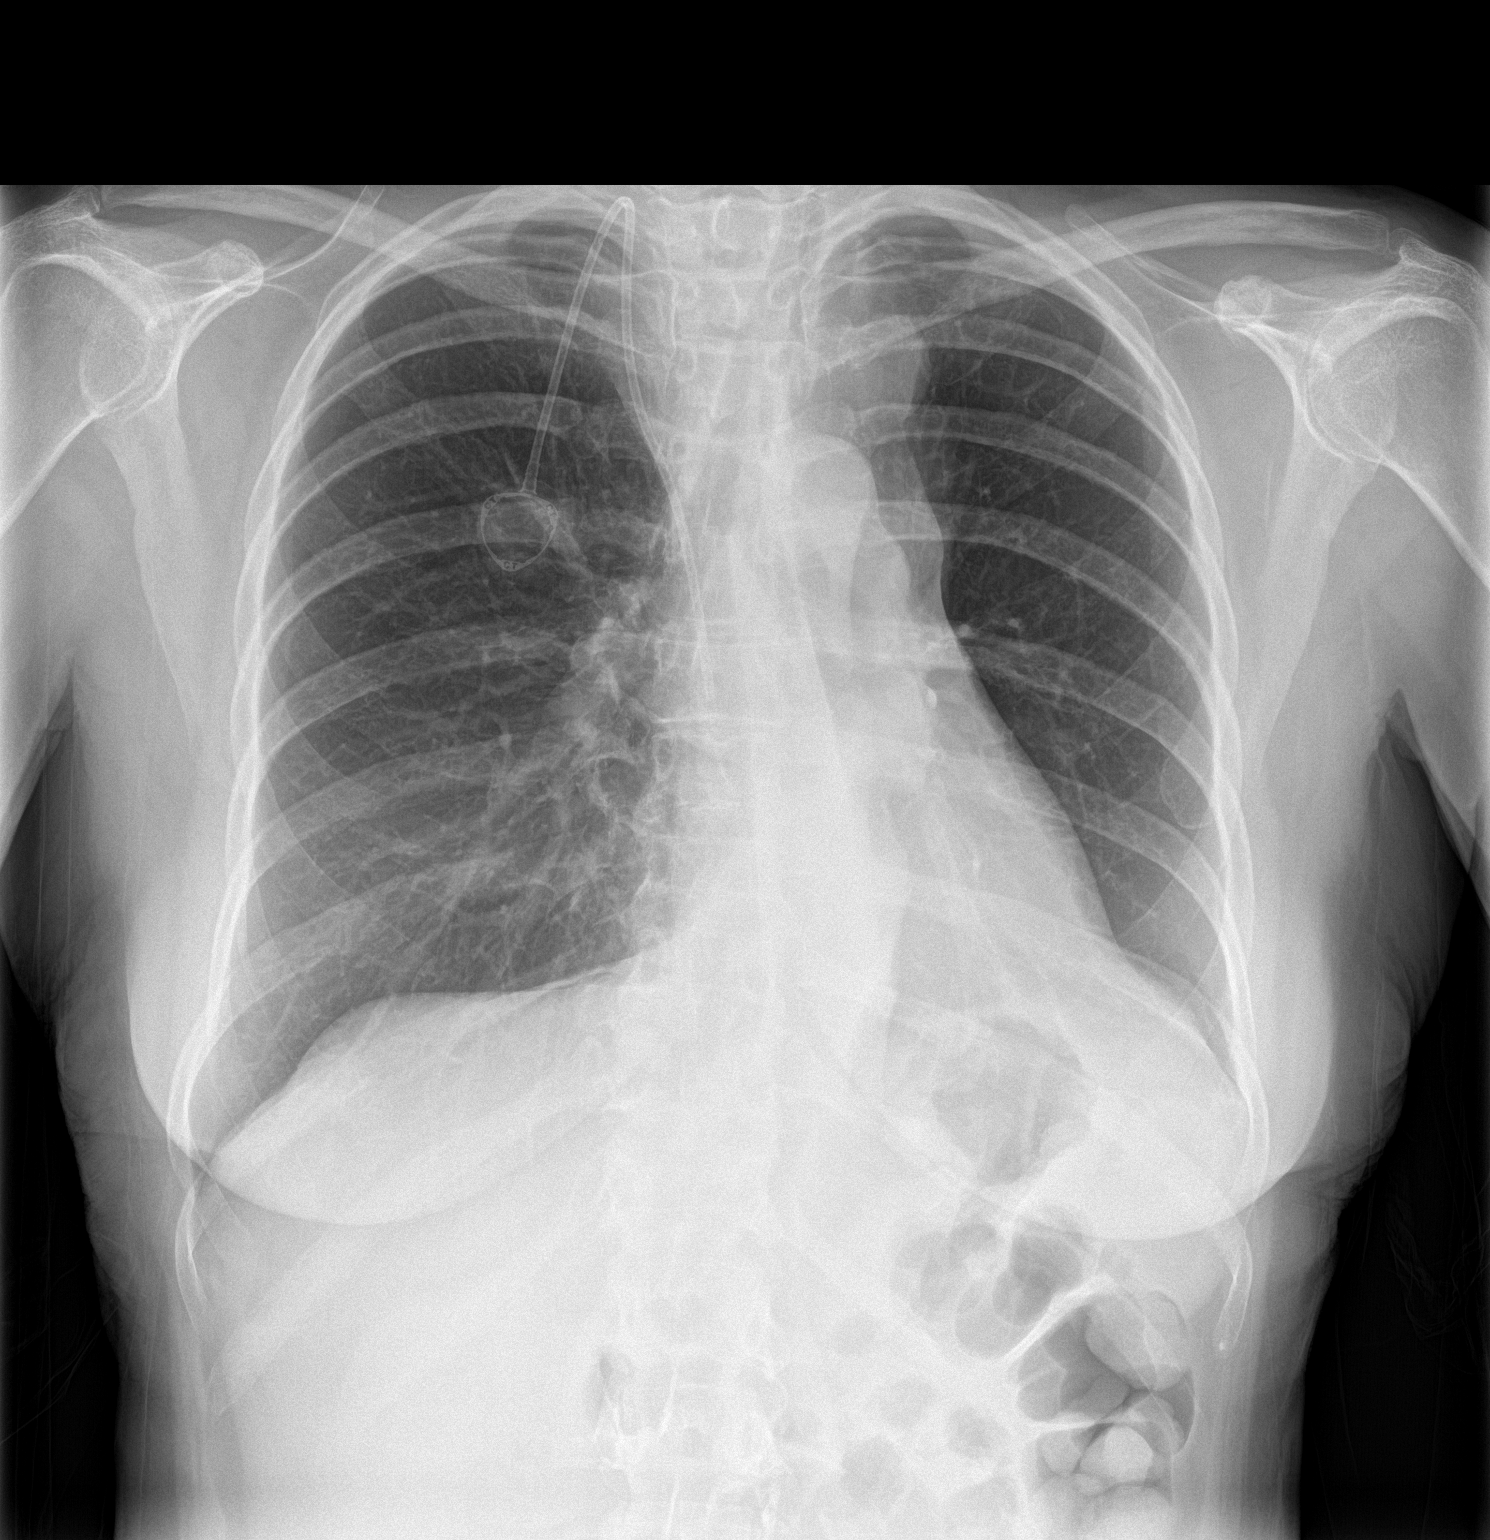

[chest lat]
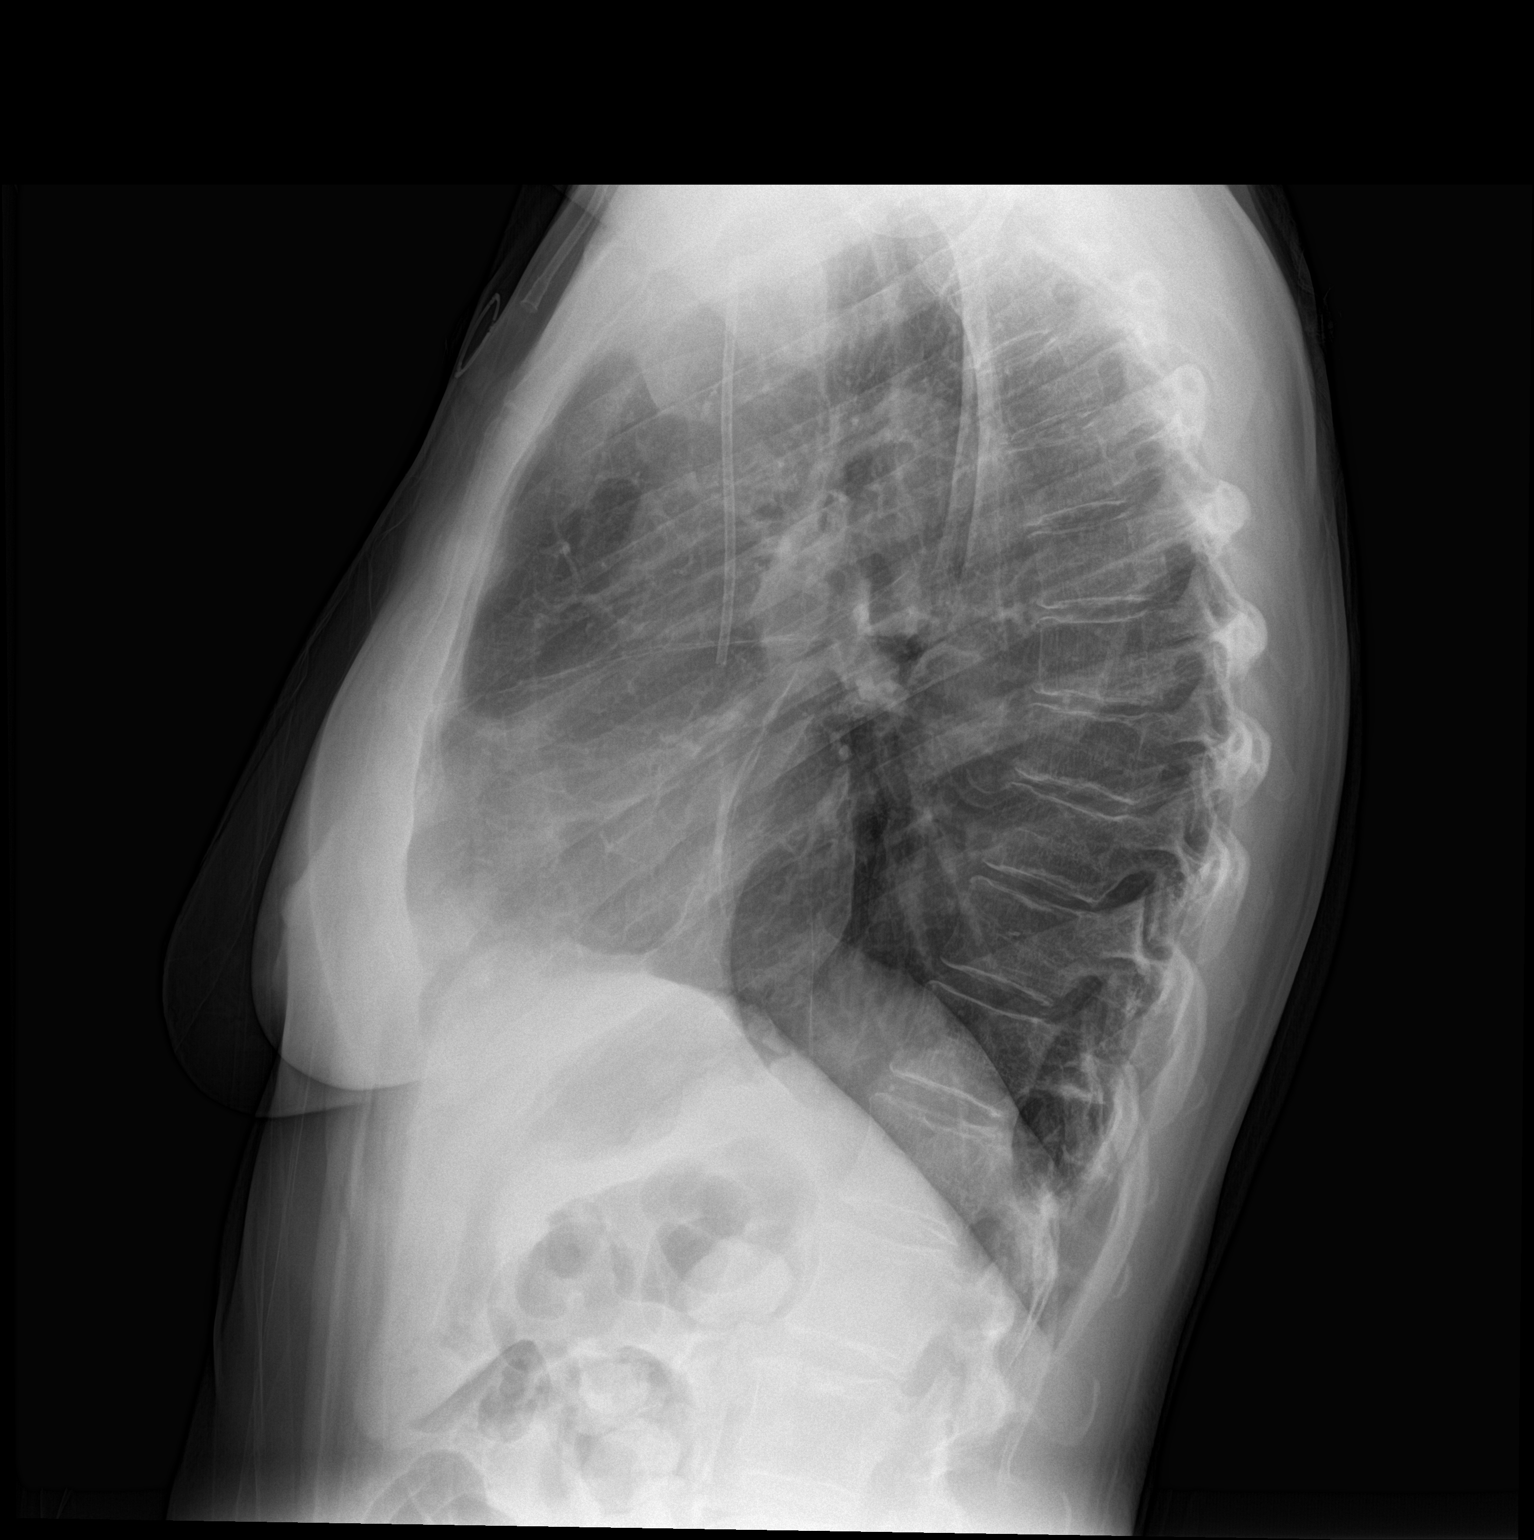

[2 of 2 positions shown; findings below may reference images not displayed]

FINDINGS: Right chest wall port a catheter is noted with tip in the cavoatrial
junction. Normal heart size. Continued complete atelectasis of the
left lower lobe with leftward shift of the mediastinum. The right
lung appears hyperexpanded and clear.
IMPRESSION: 1. Persistent atelectasis of the left lower lobe secondary to
central obstructing pulmonary lesion.

## 2018-05-01 IMAGING — RF DG ESOPHAGUS
7 series · 14 of 14 positions shown · non-contrast
Comparison: CT 05/30/2016 .

CLINICAL DATA: Swelling right neck.

EXAM:
ESOPHOGRAM/BARIUM SWALLOW
TECHNIQUE: Single contrast examination was performed using  thin barium.
FLUOROSCOPY TIME:  Fluoroscopy Time:  0 minutes 30 seconds
Radiation Exposure Index (if provided by the fluoroscopic device):
5.8 mGy
Number of Acquired Spot Images: 17

[Series 1: fluoro_barium 2fps_bw · 0.18mm/px · 4 of 6 frames shown (1 of 7)]
[frame 1/6]
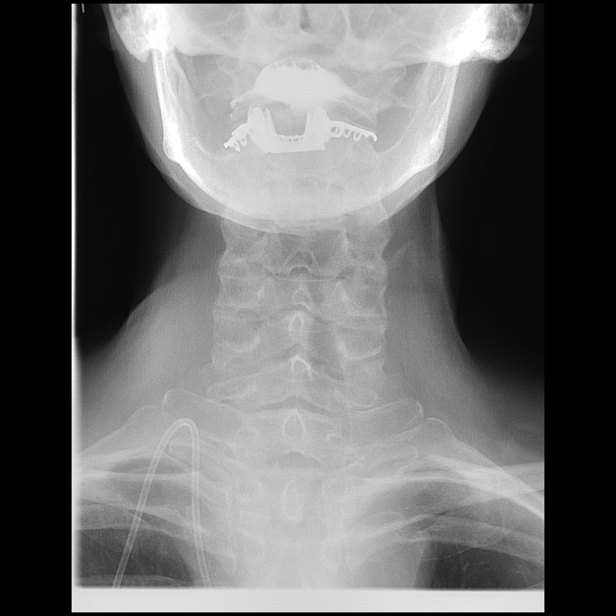
[frame 3/6]
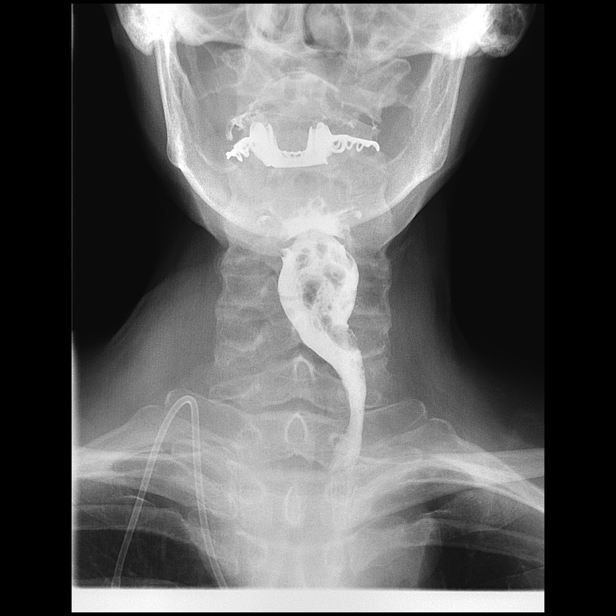
[frame 4/6]
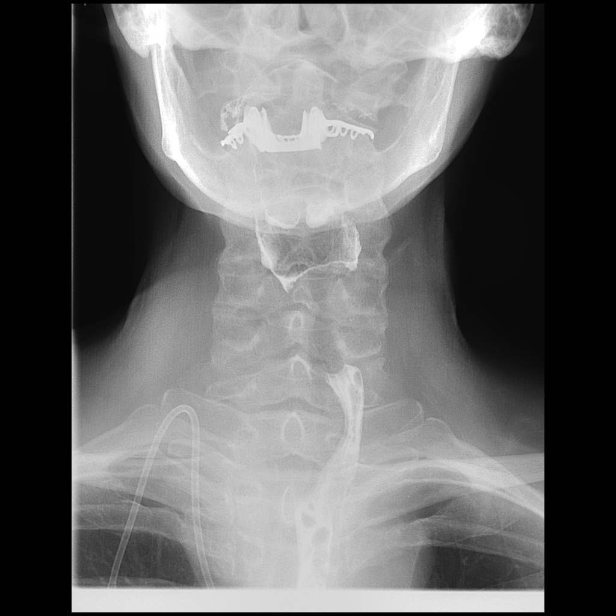
[frame 6/6]
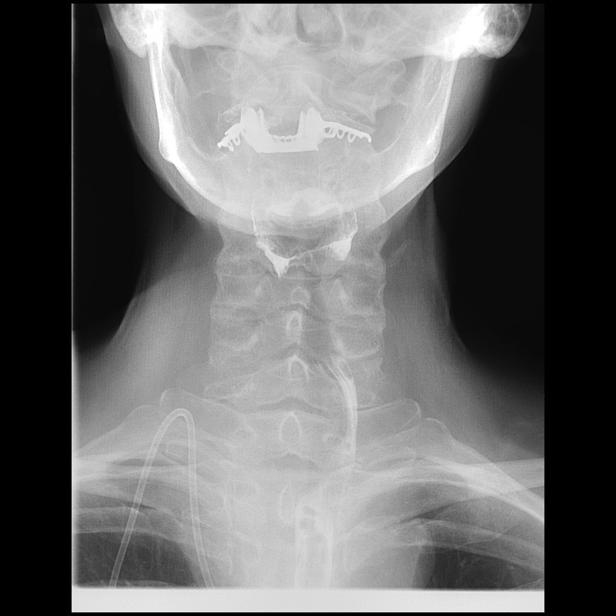

[Series 2: fluoro_barium 2fps_bw · 0.18mm/px · 4 of 6 frames shown (2 of 7)]
[frame 1/6]
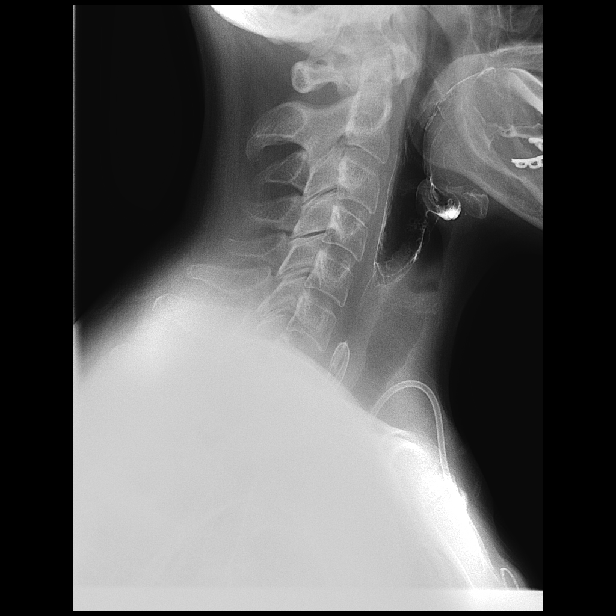
[frame 4/6]
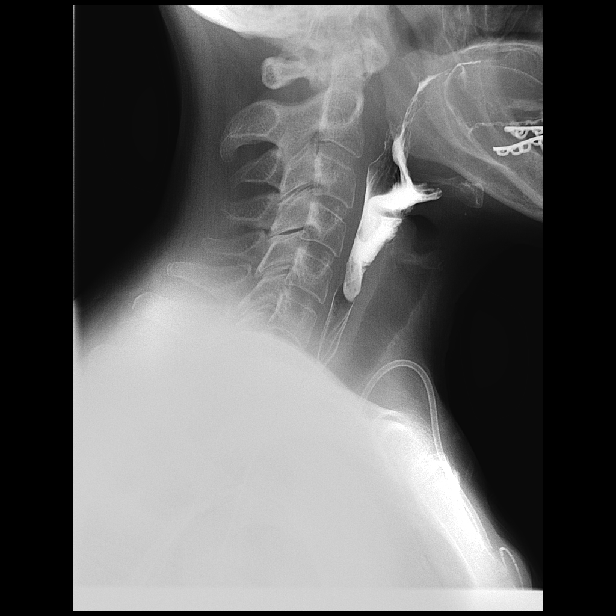
[frame 5/6]
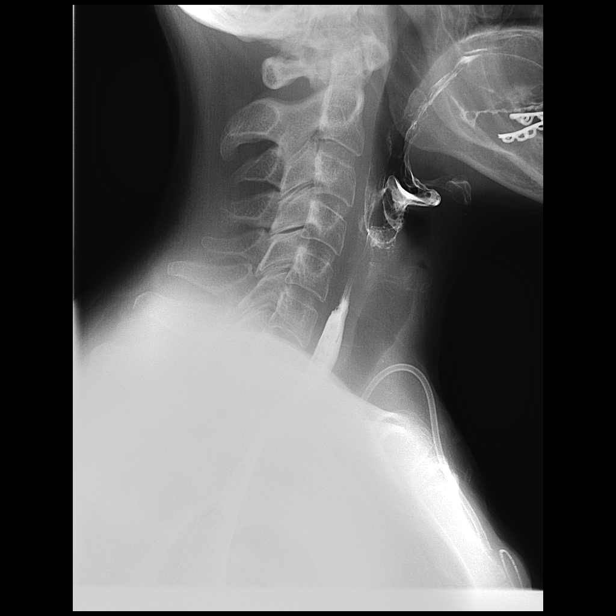
[frame 6/6]
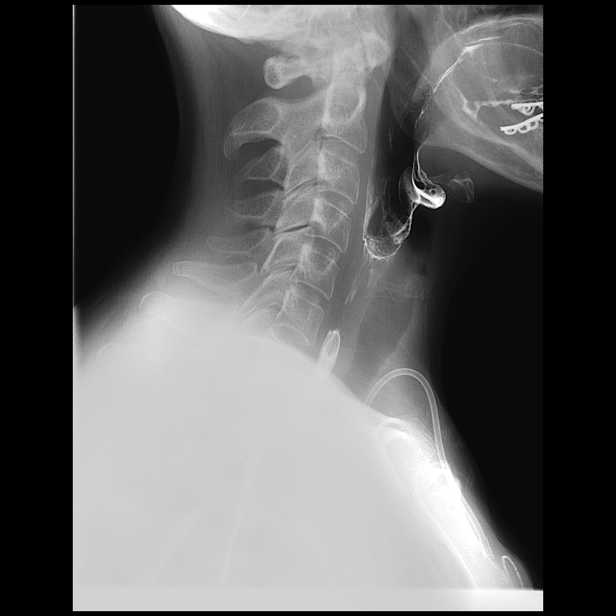

[Series 3: fluoro_barium 2fps_bw · 0.18mm/px · 2 of 2 frames shown (3 of 7)]
[frame 1/2]
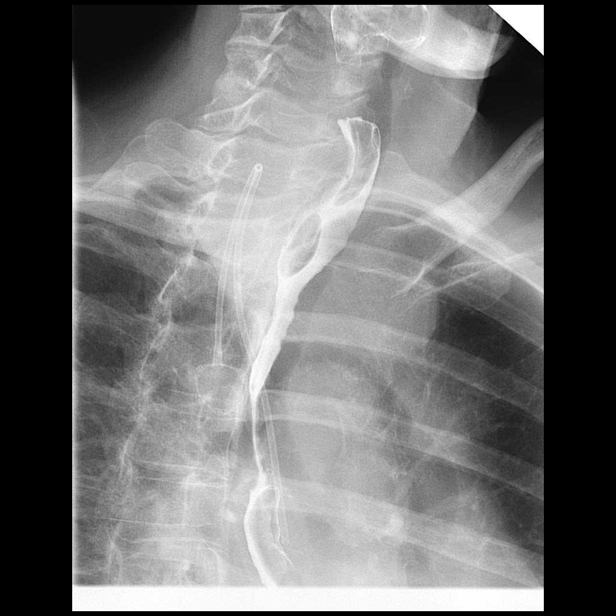
[frame 2/2]
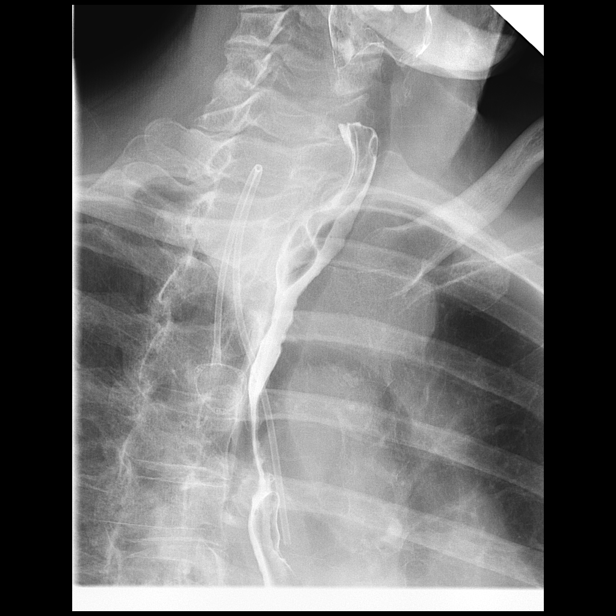

[Series 4: fluoro_barium 2fps_bw · 0.18mm/px · 1 of 1 slices shown (4 of 7)]
[im 1/1]
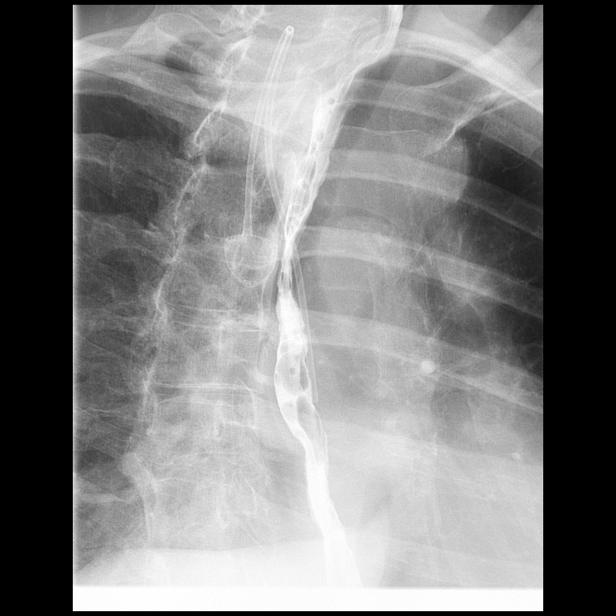

[Series 5: fluoro_barium 2fps_bw · 0.18mm/px · 1 of 1 slices shown (5 of 7)]
[im 1/1]
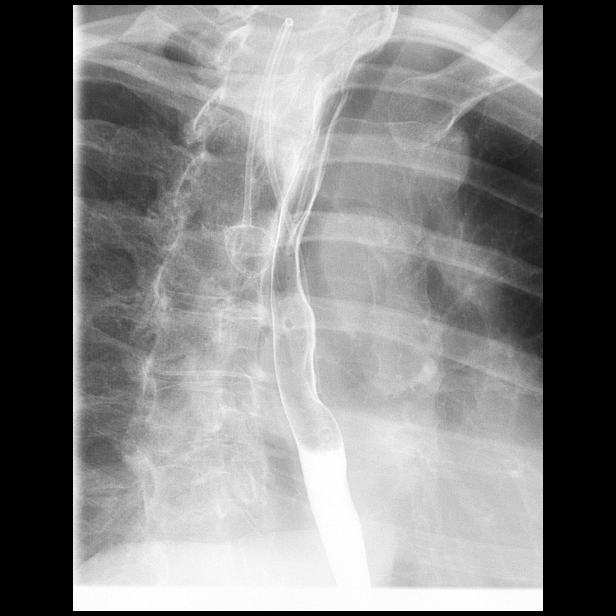

[Series 6: fluoro_barium 2fps_bw · 0.18mm/px · 1 of 1 slices shown (6 of 7)]
[im 1/1]
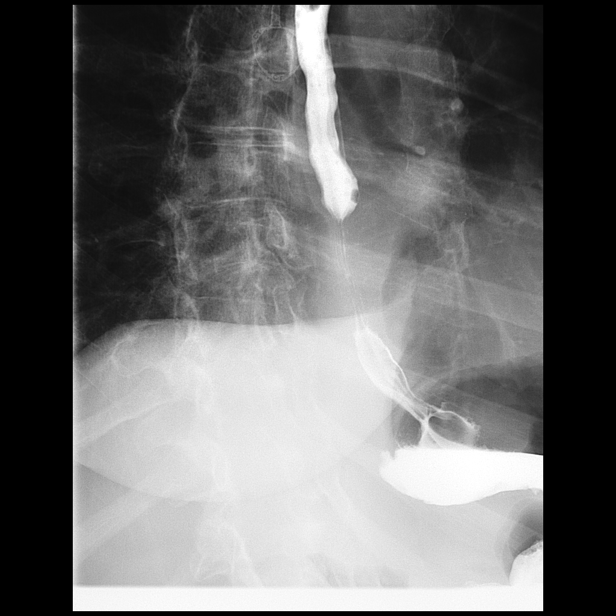

[Series 7: fluoro_barium 2fps_bw · 0.18mm/px · 1 of 1 slices shown (7 of 7)]
[im 1/1]
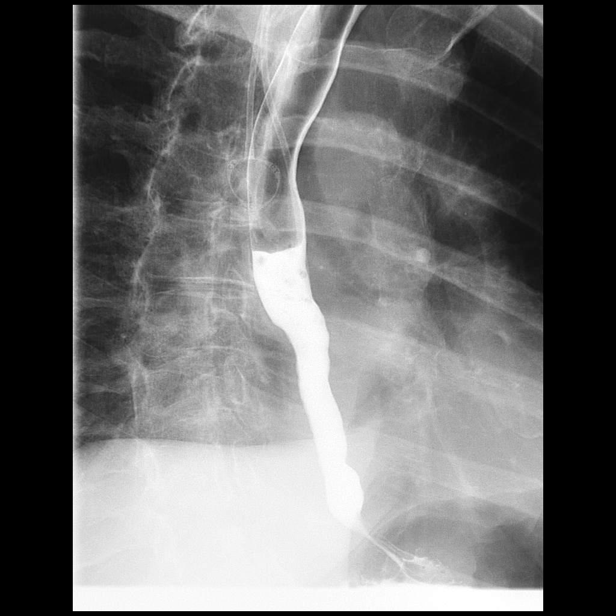

[14 of 14 positions shown; findings below may reference images not displayed]

FINDINGS: Displacement of the cervical esophagus to the left is noted. This is
consistent with paraesophageal adenopathy given patient's history
and prior CT findings of cervical/upper mediastinum adenopathy.
Scratched The esophagus is widely patent. No obstructing lesion is
identified.
IMPRESSION: 1. Deviation of the cervical esophagus to the left consistent with
cervical/upper mediastinal adenopathy as noted on prior recent CT of
05/30/2016.

[DATE].  Esophagus is widely patent.  No obstructing lesion identified .
